# Patient Record
Sex: Female | Born: 1965 | Race: White | Hispanic: No | Marital: Single | Smoking: Former smoker
Health system: Southern US, Community
[De-identification: ages and names within clinical notes are randomized; demographics above are authoritative.]

## PROBLEM LIST (undated history)

## (undated) DIAGNOSIS — D649 Anemia, unspecified: Secondary | ICD-10-CM

## (undated) DIAGNOSIS — F17210 Nicotine dependence, cigarettes, uncomplicated: Secondary | ICD-10-CM

## (undated) DIAGNOSIS — I219 Acute myocardial infarction, unspecified: Secondary | ICD-10-CM

## (undated) DIAGNOSIS — I639 Cerebral infarction, unspecified: Secondary | ICD-10-CM

## (undated) DIAGNOSIS — F419 Anxiety disorder, unspecified: Secondary | ICD-10-CM

## (undated) DIAGNOSIS — J449 Chronic obstructive pulmonary disease, unspecified: Secondary | ICD-10-CM

## (undated) DIAGNOSIS — I10 Essential (primary) hypertension: Secondary | ICD-10-CM

## (undated) DIAGNOSIS — K219 Gastro-esophageal reflux disease without esophagitis: Secondary | ICD-10-CM

## (undated) DIAGNOSIS — E119 Type 2 diabetes mellitus without complications: Secondary | ICD-10-CM

## (undated) DIAGNOSIS — J45909 Unspecified asthma, uncomplicated: Secondary | ICD-10-CM

## (undated) DIAGNOSIS — E669 Obesity, unspecified: Secondary | ICD-10-CM

## (undated) DIAGNOSIS — Z8673 Personal history of transient ischemic attack (TIA), and cerebral infarction without residual deficits: Secondary | ICD-10-CM

## (undated) HISTORY — PX: ABOVE KNEE LEG AMPUTATION: SUR20

## (undated) HISTORY — PX: ANGIOPLASTY: SHX39

---

## 2001-03-11 ENCOUNTER — Inpatient Hospital Stay (HOSPITAL_COMMUNITY): Admission: EM | Admit: 2001-03-11 | Discharge: 2001-03-13 | Payer: Self-pay | Admitting: Emergency Medicine

## 2001-03-12 ENCOUNTER — Encounter: Payer: Self-pay | Admitting: Cardiovascular Disease

## 2001-03-17 ENCOUNTER — Ambulatory Visit (HOSPITAL_COMMUNITY): Admission: RE | Admit: 2001-03-17 | Discharge: 2001-03-18 | Payer: Self-pay | Admitting: Cardiovascular Disease

## 2001-03-24 ENCOUNTER — Inpatient Hospital Stay (HOSPITAL_COMMUNITY): Admission: RE | Admit: 2001-03-24 | Discharge: 2001-03-30 | Payer: Self-pay

## 2001-03-24 ENCOUNTER — Encounter (INDEPENDENT_AMBULATORY_CARE_PROVIDER_SITE_OTHER): Payer: Self-pay | Admitting: Specialist

## 2001-04-05 ENCOUNTER — Encounter: Payer: Self-pay | Admitting: Emergency Medicine

## 2001-04-05 ENCOUNTER — Emergency Department (HOSPITAL_COMMUNITY): Admission: EM | Admit: 2001-04-05 | Discharge: 2001-04-05 | Payer: Self-pay | Admitting: Emergency Medicine

## 2001-07-21 ENCOUNTER — Ambulatory Visit (HOSPITAL_COMMUNITY): Admission: RE | Admit: 2001-07-21 | Discharge: 2001-07-21 | Payer: Self-pay

## 2004-10-10 ENCOUNTER — Emergency Department (HOSPITAL_COMMUNITY): Admission: EM | Admit: 2004-10-10 | Discharge: 2004-10-10 | Payer: Self-pay

## 2005-09-20 ENCOUNTER — Ambulatory Visit: Payer: Self-pay | Admitting: Oncology

## 2009-02-24 ENCOUNTER — Encounter (HOSPITAL_BASED_OUTPATIENT_CLINIC_OR_DEPARTMENT_OTHER): Admission: RE | Admit: 2009-02-24 | Discharge: 2009-03-10 | Payer: Self-pay | Admitting: Internal Medicine

## 2011-05-14 NOTE — Consult Note (Signed)
[name], [name]               ACCOUNT NO.:  [id number]   MEDICAL RECORD NO.:  [id number]          PATIENT TYPE:  REC   LOCATION:  FOOT                         FACILITY:  [facility]   PHYSICIAN:  Ermalene Searing. Philip Aspen, M.D.DATE OF BIRTH:  1966-07-23   DATE OF CONSULTATION:  DATE OF DISCHARGE:                                 CONSULTATION   PRIMARY CARE PHYSICIAN:  Dr. Despina Pole.   CHIEF COMPLAINT:  Chronic left leg and foot ulcers associated with left  lower extremity arterial insufficiency.   HISTORY OF PRESENT ILLNESS:  The patient is a 45 year old Caucasian  woman with multiple medical problems.  She has longstanding diabetes  mellitus, type 2 for which she uses pills and insulin that is  complicated by peripheral neuropathy and atherosclerotic peripheral  vascular disease.  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  In early December 2009,  she was admitted to [facility] for a left femoral to peroneal artery  bypass procedure.  However, within 24 hours, the graft was occluded.  She underwent a left thrombectomy next day, however, intraoperatively it  was noted that the patient's distal anastomoses were severely stenosed  and calcified, and not amendable to operative management.  She was  discharged and then readmitted on December 26, 2008, for left leg wound  infection.  At that point, she underwent incision and drainage of the  left medial calf area.  At that point, the calf wound size measured  approximately 16 cm x 3 cm x 2.5 cm.  There was exposed subcutaneous fat  as well as muscle with no visible or palpable bone.  There was necrotic  tissue along the anterior edge of the wound.  The wound was debrided and  she was started on wound VAC treatment.  Unfortunately, this did not  improve the appearance of her wound or the ischemic pain that she has.  She presented to our clinic to see if she was a candidate for hyperbaric  oxygen treatment.   PAST MEDICAL HISTORY:  Otherwise significant for:  1. Flexion contractures of the left upper extremity and left lower      extremity.  2. Osteoarthritis of the left knee.  3. History of remote tonsillectomy.  4. Aortic clot removal in 2000.  5. Myocardial infarction x2 in 1997 with PTCA and stent procedures.   MEDICATIONS:  Medications prior to office visit include the following:  1. Glipizide 10 mg twice daily.  2. Digoxin 125 mcg daily.  3. Aspirin 81 mg daily.  4. Plavix 75 mg daily.  5. Zocor 80 mg daily.  6. Lyrica 75 mg 3 times daily.  7. Fosamax 70 mg once weekly.  8. Xopenex HFA 2 puffs 4 times daily.  9. Xanax 0.5 mg twice daily.  10.Nitroglycerin 0.4 mg sublingual p.r.n. chest pain.  11.Nexium 40 mg daily.  12.Imdur 60 mg daily.  13.Glucotrol 10 mg twice  daily.  14.Augmentin 875 mg twice daily.  15.OxyContin 40 mg every a.m. and 20 mg every p.m.  16.Claritin 10 mg daily p.r.n.  17.Albuterol nebulizer treatments 4 times daily p.r.n. wheezing.   ALLERGIES AND MEDICATION INTOLERANCES:  1. FLEXERIL has been associated with seizures.  2. PAXIL has been associated with heart palpitations.  3. ACE INHIBITORS have been somewhat problematic.   SOCIAL HISTORY:  She is divorced.  She has a 1 year old daughter, who  is in high school and a 22 year old daughter, who is in college.  She  has ongoing tobacco use of one pack per day and has been unable to quit  smoking.  She has no history of alcohol abuse.  She is disabled and does  work part-time at [employer], [employer].   FAMILY HISTORY:  Notable for close relatives with diabetes mellitus and  breast cancer, but none with colon cancer.   REVIEW OF SYSTEMS:  She has chronic dry cough without shortness of  breath on room air.  She has not had recent fever,  chills, chest pain,  or palpitations.  She has not had nausea, vomiting, significant  constipation, or diarrhea.  She has burning left leg pain much of the  time and some numbness in her feet.  She is weak in her left side and  uses a quad cane.  She has chronic mild anxiety and denies depression.   INITIAL PHYSICAL EXAMINATION:  VITAL SIGNS:  Blood pressure 121/72,  pulse 118, temperature 98.3, and blood glucose 134.  GENERAL:  She is a chronically ill-appearing white female, who had an  occasional dry cough, but no shortness of breath.  HEAD, EYES, EARS, NOSE, AND THROAT:  Within normal limits.  NECK:  Supple without jugular venous distention or carotid bruit.  CHEST:  Clear to auscultation.  HEART:  Regular rate and rhythm without significant murmur or gallop.  ABDOMEN:  Normal bowel sounds and no hepatosplenomegaly or tenderness.  EXTREMITIES:  The right leg had no cyanosis or edema and the pedal  pulses were 1+ in the anterior and posterior tibial arteries.  The left  foot had decreased light touch sensation.  Wound #1, on the medial and  proximal aspect of the left leg, there is a ulcer that measures 12.8 cm  x 6.2 cm x 0.7 cm.  This wound has partial coverage with eschar and  moderate necrotic tissue with a red-to-yellow wound base and red  granulation tissue.  There was exposed deep tendon in the mid aspect of  the wound.  There was mild foul odor and a large amount of yellow-to-  green drainage with an intact periwound integrity.  Wound #2 is located  on the plantar aspect of the left great toe and measures 0.4 cm x 0.5 cm  x undetermined depth.  This wound was fully covered with eschar and had  no significant necrotic debris.  There was no exposed bone, tendon, or  muscle, and no foul odor or discharge.  There was mild erythema around  the wound.  Wound #3 is located on the anterior aspect of the left  second toe and measures 0.3 cm x 0.4 cm x undetermined depth.  This  wound  was fully covered with eschar and did not have significant  drainage and had mild erythema around the wound.  Wound #4 is located on  the distal and anterior left leg, measures 1.6 cm x 1.7 cm x  undetermined depth.  The wound was fully covered with  eschar and had no  significant drainage and an intact periwound integrity. The left foot is  somewhat cool with some cyanosis of the second toe and toe tip capillary  refill occurs about 2 seconds.  The pedal pulses were not palpable.   IMPRESSION:  Multiple left leg and foot ulcers:  She has persistent  significant ulcers in the proximal left leg and foot due to severely  impaired arterial blood supply in the phase of diabetic neuropathy.  It  is unclear as to whether or not hyperbaric oxygen treatment would be  useful for her.  She clearly has no other options having recently had  surgery followed by a thrombectomy and poor distal targets.   PLAN:  After all the wounds were cleaned and a #10 scalpel blade was  used by Dr. Bubba Camp to debride the large left leg wound.  This was done  with minimal bleeding that stopped with application of pressure and  bandages.  Following this, the wound was packed with moist gauze and  then covered with dry gauze and a Kerlix wrap.  The small ulcers on the  distal left leg and foot were treated with Santyl and covered by gauze.  These dressings should be changed 3 times weekly by home health nursing.  In addition, she subsequently had a transcutaneous oxygen tension  measurements on the proximal left leg and dorsum of the left foot.  The  baseline TCPO2 measured out at 3 mmHg on the proximal left leg and at 9  mmHg on the left foot.  After challenge with 100% oxygen, there was no  significant change in the tissue oxygen tension levels.  This is  indicative of very poor arterial blood flow and low likelihood of  significant improvement with hyperbaric oxygen treatment.  Occasionally,  the patients will show  significant improvement in tissue oxygenation  with as little as 5 treatments of hyperbaric oxygen.  I will discuss the  findings with the patient and see if she would like to proceed with a  brief trial of hyperbaric oxygen.  Again, it was stressed to her that  reduction or complete cessation of smoking would also help her minimal  arterial blood flow.           ______________________________  Ermalene Searing Philip Aspen, M.D.     DGP/MEDQ  D:  02/28/2009  T:  03/01/2009  Job:  [id number]   cc:   Dr. Ferol Luz, MD

## 2011-05-17 NOTE — Cardiovascular Report (Signed)
[place]. [facility]  Patient:    KANISHKA, SPARKS                        MRN: [id number] Proc. Date: 03/17/01 Adm. Date:  [id number] Attending:  Berry, Jonathan Martinique CC:         Peripheral Vascular Angiographic Suite  Gilford Rile, M.D., [place], [place] [address], [place], [place] [address]   Cardiac Catheterization  INDICATIONS:  Ms. Rogue Bussing is an unfortunate 45 year old, divorced white female, mother of two, who has CAD and PVOD.  She has had an MI in 1997 and subsequent stroke.  She has non-insulin requiring diabetes, tobacco abuse, and hyperlipidemia as well as a family history of heart disease.  She is referred by Dr. Bea Graff for claudication.  She has diminished ABIs and underwent angiography on March 14 revealing fairly focal distal abdominal aortic stenosis.  She was placed on Lovenox five days ago and presents now for abdominal aortography, PTA and stenting of her distal abdominal aorta.  DESCRIPTION OF PROCEDURE:  The patient was brought to the sixth floor [place] Peripheral Vascular Angiographic Suite in the postabsorptive state.  She was premedicated with p.o. Valium and IV Versed.  Both groins were then prepped and shaved in the usual sterile fashion.  Xylocaine 1% was used for local anesthesia.  An 8 [place], 30 cm, coronary sheath was inserted into the right femoral artery using standard Seldinger technique, and a 7 French sheath was inserted into left femoral artery under direct angiographic guidance.  A total of 3000 units of heparin were administered intravenously.  Richard Rollene Fare assisted during her case.  Hand injection through the side-arm sheath revealed total occlusion of the distal abdominal aorta. Attempts at locating the previously found lumen were unsuccessful using multiple guidewires including a 0.35 Wholey, 0.35 angled Glidewire, 0.18 and 0.10 Cross-It wire.  The procedure was aborted  because of fear of going subintimal and dissecting the aorta.  OVERALL IMPRESSION:  Unsuccessful distal abdominal aortic endovascular reconstruction, secondary to interim occlusion of the distal abdominal aorta. Unfortunately, the patient will require either distal abdominal aortic endarterectomy versus aortobifemoral bypass grafting.  An ACT was measured and both sheaths were removed.  Pressure was held on each groin to achieve hemostasis.  The patient left the lab in stable condition. Lovenox will be started in the morning after which the patient will be discharged on b.i.d. subcutaneous Lovenox and ultimate outpatient Coumadin anticoagulation to a target INR of 2.5.  The patient left the lab in stable condition. DD:  03/17/01 TD:  03/18/01 Job: [id number]

## 2011-05-17 NOTE — Cardiovascular Report (Signed)
[place]. [facility]  Patient:    [name], [name]                        MRN: [id number] Proc. Date: 03/12/01 Adm. Date:  [id number] Attending:  Berry, Jonathan Martinique CC:         Peripheral Vascular Angiography Suite  Gilford Rile, M.D.  [facility]   Cardiac Catheterization  PROCEDURE:  Peripheral angiogram.  CARDIOLOGIST:  Lorretta Harp, M.D.  INDICATIONS:  Ms. [name] is an unfortunate 45 year old divorced white female patient of Dr. Bea Graff referred for PVOD.  Her risk factors profile is positive for tobacco abuse, non-insulin-dependent diabetes mellitus, hyperlipidemia and a strong family history of heart disease.  She had an myocardial infarction in 1997 and underwent angioplasty at that time.  Recent studies showed LAD with scarred with an ejection fraction of 45%.  She had a stroke in 1999 leaving her with a left hemiparesis.  She is complaining of bilateral lower extremity claudication with ability to walk only 15-20 feet without stopping.  She had diminished ABIs with dampened and monophasic femoral wave forms.  She presents now for angiography and potential intervention.  DESCRIPTION OF PROCEDURE:  The patient was brought to the sixth floor [place] Angiographic Suite in the postabsorptive state.  She was premedicated with p.o. Valium.  Her right groin was prepped and draped in the usual sterile fashion.  Then 1% Xylocaine was used for local anesthesia.  Using a smart Doppler type needle, _____ was obtained, and 5 French sheath was inserted using standard Seldinger technique.  A Judkins catheter was used to traverse the distal abdominal aorta.  The 5 French tennis-racket catheter, Ram and end-hole catheters were used for midsternal and distal abdominal aortography as well as bifemoral runoff.  Radiopaque dye was used for the entirety of the case.  A transstenotic gradient was noted here.  The area was  checked, and the sheaths were removed.  Pressure was held to the groin to achieve hemostasis.  The patient will be reheparinized and coumadinized and discharged home on an INR greater than or equal to 2.  She left the lab in stable condition.  ANGIOGRAPHIC RESULTS: 1. Abdominal aorta:    a. Renal arteries.    b. Infrarenal abdominal aorta 90% eccentric, distal abdominal       stenosis with a 5 mm gradient. 2. Left lower extremity:  Normal with three vessel run off. 3. Right lower extremity:  Normal with three vessel run off.  IMPRESSION:  Ms. [name] has a 90% eccentric distal abdominal aortic stenosis which is hemodynamically significant and amenable to PTA and stenting.  PLAN:  She will be brought back in several weeks for percutaneous intervention. DD:  03/12/01 TD:  03/12/01 Job: [id number]

## 2011-05-17 NOTE — Discharge Summary (Signed)
[place]. [facility]  Patient:    [name], [name]                        MRN: [id number] Adm. Date:  [id number] Disc. Date: 03/13/01 Attending:  Berry,Jonathan Martinique Dictator:   York Grice, P.A.                          Discharge Summary  ADMITTING DIAGNOSES: 1. Lower extremity claudication, abnormal lower extremity Dopplers.  Admit for    peripheral angiogram. 2. Non-insulin dependent diabetes mellitus. 3. Hyperlipidemia. 4. History of myocardial infarction in 1997. 5. Tobacco abuse. 6. History of cerebrovascular accident in 1998, with hemiparesis on the left    side. 7. History of ______ ______ .  Ms. Callaham is a 45 year old divorced white female, referred to our office by Dr. ______ for evaluation of peripheral vascular obstructive disease.  She had a myocardial infarction in 1997 and at that time, underwent a procedure which sounds like angioplasty, but do not have any records, and we are not sure what the procedure was exactly.  Her recent Cardiolite stress test in March 2002, showed scarring in the LAD territory with ejection fraction of 45% and no ischemia.  She also has a history of stroke in 1998, leaving her with hemiparesis on the left side.  She presented with bilateral lower extremity claudication for a long time, which was increasing gradually and dramatically worsened over the last several weeks.  She is unable to walk more than 15-20 feet without the pain.  Lower extremities arterial study showed significant bilateral commoniliac stenosis greater than 50%, and patient was admitted to the hospital for the procedure on March 12, 2001.  OUTPATIENT MEDICATIONS: 1.Darvocet-N 100 p.r.n. 2. Coumadin 5 mg alternate with 6 mg q.d. depending on the results of PT and    INR. 3. Lipitor 10 mg q.d. 4. Lanoxin 0.125 mg q.d. 5. Glucotrol 5 mg q.d. 6. Nexium 40 mg q.d. 7.Miralax.  HOSPITAL COURSE:  She was admitted to the hospital on the  14th of March and at the time of admission, her temperature 99.7, blood pressure 128/75, pulse 111, and respirations 20.  Protime was 13.2 and INR 1.0, PTT 27.  EKG showed normal sinus rhythm with low voltage.  She underwent peripheral vascular angiogram via the right femoral artery, performed by Dr. Gwenlyn Found March 12, 2001.  At that time, no renal artery disease wasfound, but the distal abdominal artery was occluded by 90% with pressureof 50 mmHg.  The patient tolerated the procedure well, and she was givena bolus of 2500 units of heparin IV and then IV drip. The next morning,the patient was found in stable condition with no bleeding or oozing from the catheterization site in the groin and mild bruise.  HOSPITAL CONSULTS:  None.  HOSPITAL LABORATORY STUDIES:  CBC - White blood cells 14.7, hemoglobin 12.5, hematocrit 35.2, platelets 236.  BMP from March 14,2002, showed sodium 135, potassium 3.7, chloride 109, carbon dioxide 19,glucose 135, BUN 12, creatinine .6.  PTT 74 at 3:00 in the afternoon andthen 19 at 7 a.m.  PHYSICAL EXAMINATION ON DISCHARGE:  GENERAL:  Alert and oriented x 3, in no acute distress.  NECK:  Supple, no thyromegaly, no lymphadenopathy, no carotid bruits or JVD.  LUNGS:  Clear to auscultation bilaterally.  HEART:  Irregular rate and rhythm.  No murmur, gallop, or rub.  ABDOMEN:  Soft, nontender, nondistended, active bowel sounds,  and no hepatosplenomegaly, organomegaly, or audible bruits.  EXTREMITIES:  Femoral arteries are barely palpable, and pedalpulses diminished with cold feet.  DISCHARGE DIAGNOSES: 1.Peripheral vascular obstructive disease, status post peripheral angiography    with 90% lower abdominal aorta stenosis.  We plan to bring the patient back    to the hospital on Tuesday next week and proceed with percutaneous    transluminal angioplasty and stent. 2. Coronary artery disease, status post myocardial infarction in 1997.  Left    Cardiolite in  February 2002, which showed scarring in the LAD territory and    ejection fraction 35%. 3. Non-insulin dependent diabetes mellitus, on Glucotrol. 4. Statuspost cerebrovascular accident in 1998. 5. Hyperlipidemia. 6. Long history of tobacco use. 7. Family history of coronary vascular accident.  DISCHARGE MEDICATIONS: 1. Injections of Lovenox 70 mg twice a day for3 days, Saturday, Sunday,    Monday. 2. Lipitor 10 mg 1 p.o. daily. 3. Lanoxin 0.125 mg 1 p.o. daily. 4. Nexium 40 mg q p.o. daily. 5. Glucotrol XL 5 mg 1 pill daily.  The patient will be back in the hospital on Tuesday for PTA and stent performed by Dr. Gwenlyn Found.DD:  03/13/01 TD:  03/13/01 Job: [id number]

## 2011-05-17 NOTE — Op Note (Signed)
[place]. [facility]  Patient:    [name], [name]                        MRN: [id number] Proc. Date: 03/24/01 Adm. Date:  [id number] Attending:  Tiffany Kocher CC:         Lorretta Harp, M.D.                           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  POSTOPERATIVE DIAGNOSIS:  Aortic occlusion due to peripheral vascular disease.  PROCEDURE:  Aortoiliac thromboendarterectomy with patch angioplasty.  SURGEON:  Ward Givens, M.D.  ASSISTANT:  Coralie Keens, M.D.  ANESTHESIA:  General.  DESCRIPTION OF PROCEDURE:  After the patient was monitored with an arterial monitor and central venous line and had a Foley catheter, induction of anesthesia and routine preparation and draping of the abdomen.  I made a midline incision from 3-4 cm below the xiphoid to about halfway between the umbilicus and pubis.  I entered the abdomen and explored it, finding some adhesions of omentum to the anterior abdominal wall and the pelvis, and I took those down.  The colon, small bowel, liver, stomach, gallbladder, and other viscera appeared to be normal.  I then got good exposure of the retroperitoneum and retracted the colon upward, small bowel to the right, sigmoid downward, and incised over the aorta, dissecting down to the aorta, dissecting the duodenum to the right, taking care to avoid injury to the inferior mesenteric vein and artery.  There was a nice pulse in the aorta, and I dissected it up to just below the renal arteries and dissected behind it so that I could pass a clamp behind.  I then dissected down to the bifurcation and on down on the iliacs for some distance but stopped short of the iliac bifurcations.  I controlled the iliacs with blue vessel loops and controlled the inferior mesenteric with a red vessel loop.  There was no pulse at the distal aorta or in the proximal iliacs, but  the iliacs were nice, soft-feeling vessels with minimal calcified plaque.  I then directed that the patient receive 6000 units of heparin and after that had had adequate time to circulate, I clamped the aorta just below the renal arteries and tightened the controls on the iliacs and inferior mesenteric.  I made a longitudinal arteriotomy on the distal aorta and removed the chronic clot which was present and dissected up until I got up to the vessel above the occluded area.  There was one lumbar vessel which was bleeding back into the aorta, and I controlled that with a single figure-of-eight suture of 4-0 Prolene.  I then took my cut down to the aortic bifurcation and felt that the plaque disease went a short distance down into the left iliac.  I extended the incision a short distance down the left iliac but did not extend it down the right iliac.  I then used a Primary school teacher and dissected in the plane between the aorta and the diseased intima, freeing it up proximally, and dividing the plaque and then dissecting distally.  Rather than follow the plaque disease distally in the iliacs, I divided it at the iliac orifices bilaterally and removed the plaque.  I then used five interrupted sutures sewn through the plaque and vessel wall and tied on the outside of  the aorta, tacking down the plaque posteriorly and posterolaterally to prevent dissection.  I then copiously irrigated the area. There was nice backbleeding from both the iliac arteries, and I bled them back three times during the case, then filled the distal vessel with heparinized saline.  The distal iliacs calibrated with a 5 mm dilator very easily.  I then irrigated out the aorta again, removed a little bit of loose thrombus material proximally, flushed the vessel forward and removed the blood and felt that my operation was satisfactory to restore good flow.  I then took a Hemashield preclotted patch and trimmed it to an elliptical  shape to nicely enlarge the distal aorta and sewed it in beginning proximally and sewing down each side distally using 5-0 Prolene suture, taking nice bites of the aorta.  When a few sutures remained to be put in, I flushed all the vessels again and flushed out the aorta with heparinized saline solution and then took the control off the right iliac and completed the suture line.  One flute of blood needed to be sutured down with a single stitch, and then hemostasis was excellent. Hemostasis was excellent otherwise.  There were good pulses in the distal iliac arteries bilaterally.  I could not feel the femoral pulses because the patient was quite obese.  Used the Doppler and interrogated the femorals, and there was excellent, brisk flow bilaterally and extremely brisk, good flow in her iliacs.  I closed the retroperitoneum with running 2-0 Vicryl stitch, taking care to avoid injury to the duodenum and to exclude it nicely from the area of the patch graft.  I then irrigated the retroperitoneum a little more and removed the irrigant.  The sponge, needle, and instrument counts were correct.  I closed the fascia with running #1 PDS and closed the skin with staples.  The patient tolerated the operation well. DD:  03/24/01 TD:  03/25/01 Job: [id number]

## 2011-05-17 NOTE — Discharge Summary (Signed)
[place]  Patient:    [name], [name]                        MRN: [id number] Adm. Date:  [id number] Disc. Date: [id number] Attending:  Tyrone Apple CC:         Lorretta Harp, M.D.   Discharge Summary  HISTORY OF PRESENT ILLNESS:  The patient is a 45 year old white female with accelerated peripheral vascular disease, having had a stroke and an MI in the past.  She had severe buttock, thigh, and leg claudication and underwent arteriography by Dr. Gwenlyn Found, showing distal aortic stenosis and subsequent occlusion when she returned for an attempt at angioplasty.  Angioplasty was technically not feasible.  The patient was admitted to the hospital for endarterectomy or bypass to improve her lower extremity perfusion.  The patient is a smoker.  She has some pulmonary problems and needs albuterol inhaler.  She is diabetic.  She had been anticoagulated with Coumadin, which had been stopped in adequate time for it to get out of her system.  See the history and physical for further details.  PHYSICAL EXAMINATION:  Remarkable for absence of pulses in the lower extremities.  There was some left hemiparesis.  There was no evidence of severe ischemia of the lower extremities.  HOSPITAL COURSE:  On the day of admission the patient underwent an aortoiliac thromboendarterectomy with patch angioplasty.  She had arterial and Swan-Ganz monitors during and after the operation.  She was maintained in intensive care after the surgery.  After surgery her femoral and distal pulses were excellent at all times, and she developed no evidence of any wound infection.  Pain management was a pretty significant problem, but the patient did well with narcotics.  She was started on Lovenox the day after surgery and eventually put back on her Coumadin.  She was started on a clear liquid diet on March 26, 2001, and progressed as tolerated.  No GI problems occurred.  The patient  was seen in consultation by Dr. Gwenlyn Found and his associates, and she developed no cardiac complications and no further neurological problems during her hospitalization.  The patient did develop evidence of pneumonia, which was treated with antibiotics with improvement.  On March 30, 2001, she was felt to be stable for discharge and was sent home with staples in place, short-term follow-up arranged.  She is to go back on her normal Coumadin dosage.  DIAGNOSES: 1. 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 2. History of stroke. 3. History of myocardial infarction. 4. Diabetes mellitus.  OPERATIONS:  Aortoiliac endarterectomy with patch angioplasty.  CONDITION ON DISCHARGE:  Improved. DD:  04/21/01 TD:  04/21/01 Job: [id number]

## 2011-05-17 NOTE — H&P (Signed)
[place]  Patient:    [name], [name]                        MRN: [id number] Adm. Date:  [id number] Attending:  Tiffany Kocher                         History and Physical  CHIEF COMPLAINT:  Occluded aorta.  PRESENT ILLNESS:  Patient is a 45 year old white female who recently was seen by Dr. Lorretta Harp because of severe claudication and diminished distal pulses.  She underwent aortography which showed severe distal aortic stenosis and then subsequent aortic occlusion which could not be opened by angiographic endovascular means.  Patient was sent to me for consideration of revascularization of the lower extremities and is admitted to the hospital for that.  She accepts the risks of surgery including death, long hospitalization, vascular damage to the lower extremities, infection and bleeding.  She has been having some pain in the legs and feet when she walks and at night, she has numbness and tingling of the feet but no definite pain in the feet.  She is a cigarette smoker and continues to smoke.  She is a type 2 diabetic. There is a history of hyperlipidemia.  There is a strong family history of vascular and cardiac disease.  Right now, she is not having any chest pain or shortness of breath.  She denies pain in her feet.  PAST MEDICAL HISTORY:  The patient had an MI in 75.  She also has had a stroke from which she recovered well.  MEDICATIONS:  She is on a number of medications including:  1. Miralax for chronic constipation.  2. Albuterol inhaler.  3. Glucotrol XL 5 mg daily.  4. Coumadin 5 mg alternating with 6 mg daily.  5. Lorazepam 1 mg as needed.  6. Lipitor 10 mg daily.  7. Nexium 40 mg daily.  8. Lanoxin 0.125 mg daily.  9. Pain medications as needed. 10. Recently on Lovenox for continuation of her anticoagulation. 11. She also has taken birth control pills.  ALLERGIES:  She is allergic to [facility].  PAST  SURGICAL HISTORY:  She has had a C-section.  FAMILY HISTORY:  Unremarkable except for vascular disease.  CHILDHOOD ILLNESSES:  Unremarkable.  REVIEW OF SYSTEMS:  Unremarkable except for above.  PHYSICAL EXAMINATION:  GENERAL:  Patient is moderately overweight.  General exam otherwise unremarkable and mental status unremarkable.  VITAL SIGNS:  Unremarkable as recorded by nursing staff.  HEENT/NECK:  The head, neck, eyes, ears, nose, mouth and throat are unremarkable.  CHEST:  Clear to auscultation.  BREASTS:  Without masses.  HEART:  Heart rate and rhythm normal.  No murmur or gallop.  ABDOMEN:  Obese.  No mass, tenderness or organomegaly.  Pfannenstiel incision. No hernia.  EXTREMITIES:  Lower extremities:  No pulses.  Adequate capillary filling bilaterally.  Good sensation bilaterally.  There were no carotid bruits.  No femoral pulses.  SKIN:  No skin lesions noted.  LYMPH NODES:  None enlarged.  NEUROLOGIC:  Grossly intact.  IMPRESSION:  1. Aortic occlusion.  2. Coronary artery disease, stable.  3. Diffuse peripheral vascular disease with history of stroke.  4. Diabetes, type 2.  PLAN:  Aortoiliac endarterectomy or aortofemoral bypass. DD:  03/24/01 TD:  03/25/01 Job: [id number]

## 2014-02-20 ENCOUNTER — Encounter (HOSPITAL_COMMUNITY): Payer: Self-pay | Admitting: Emergency Medicine

## 2014-02-20 ENCOUNTER — Inpatient Hospital Stay (HOSPITAL_COMMUNITY): Payer: Medicare Other

## 2014-02-20 ENCOUNTER — Emergency Department (HOSPITAL_COMMUNITY): Payer: Medicare Other

## 2014-02-20 ENCOUNTER — Inpatient Hospital Stay (HOSPITAL_COMMUNITY)
Admission: EM | Admit: 2014-02-20 | Discharge: 2014-03-02 | DRG: 065 | Disposition: A | Discharge status: Skilled Nursing Facility | Payer: Medicare Other | Attending: Neurology | Admitting: Neurology

## 2014-02-20 DIAGNOSIS — I6350 Cerebral infarction due to unspecified occlusion or stenosis of unspecified cerebral artery: Principal | ICD-10-CM | POA: Diagnosis present

## 2014-02-20 DIAGNOSIS — F411 Generalized anxiety disorder: Secondary | ICD-10-CM | POA: Diagnosis present

## 2014-02-20 DIAGNOSIS — I639 Cerebral infarction, unspecified: Secondary | ICD-10-CM

## 2014-02-20 DIAGNOSIS — I2510 Atherosclerotic heart disease of native coronary artery without angina pectoris: Secondary | ICD-10-CM | POA: Diagnosis present

## 2014-02-20 DIAGNOSIS — F17200 Nicotine dependence, unspecified, uncomplicated: Secondary | ICD-10-CM | POA: Diagnosis present

## 2014-02-20 DIAGNOSIS — E1165 Type 2 diabetes mellitus with hyperglycemia: Secondary | ICD-10-CM | POA: Diagnosis present

## 2014-02-20 DIAGNOSIS — Z7902 Long term (current) use of antithrombotics/antiplatelets: Secondary | ICD-10-CM

## 2014-02-20 DIAGNOSIS — G8190 Hemiplegia, unspecified affecting unspecified side: Secondary | ICD-10-CM | POA: Diagnosis present

## 2014-02-20 DIAGNOSIS — R4701 Aphasia: Secondary | ICD-10-CM | POA: Diagnosis present

## 2014-02-20 DIAGNOSIS — R29810 Facial weakness: Secondary | ICD-10-CM | POA: Diagnosis present

## 2014-02-20 DIAGNOSIS — J449 Chronic obstructive pulmonary disease, unspecified: Secondary | ICD-10-CM | POA: Diagnosis present

## 2014-02-20 DIAGNOSIS — Z8673 Personal history of transient ischemic attack (TIA), and cerebral infarction without residual deficits: Secondary | ICD-10-CM

## 2014-02-20 DIAGNOSIS — J4489 Other specified chronic obstructive pulmonary disease: Secondary | ICD-10-CM | POA: Diagnosis present

## 2014-02-20 DIAGNOSIS — Z7982 Long term (current) use of aspirin: Secondary | ICD-10-CM

## 2014-02-20 DIAGNOSIS — I10 Essential (primary) hypertension: Secondary | ICD-10-CM | POA: Diagnosis present

## 2014-02-20 DIAGNOSIS — E785 Hyperlipidemia, unspecified: Secondary | ICD-10-CM | POA: Diagnosis present

## 2014-02-20 DIAGNOSIS — D72829 Elevated white blood cell count, unspecified: Secondary | ICD-10-CM | POA: Diagnosis present

## 2014-02-20 DIAGNOSIS — K219 Gastro-esophageal reflux disease without esophagitis: Secondary | ICD-10-CM | POA: Diagnosis present

## 2014-02-20 DIAGNOSIS — Z9861 Coronary angioplasty status: Secondary | ICD-10-CM

## 2014-02-20 DIAGNOSIS — I252 Old myocardial infarction: Secondary | ICD-10-CM

## 2014-02-20 DIAGNOSIS — F17210 Nicotine dependence, cigarettes, uncomplicated: Secondary | ICD-10-CM | POA: Diagnosis present

## 2014-02-20 DIAGNOSIS — Z6834 Body mass index (BMI) 34.0-34.9, adult: Secondary | ICD-10-CM

## 2014-02-20 DIAGNOSIS — I959 Hypotension, unspecified: Secondary | ICD-10-CM | POA: Diagnosis present

## 2014-02-20 DIAGNOSIS — E669 Obesity, unspecified: Secondary | ICD-10-CM | POA: Diagnosis present

## 2014-02-20 DIAGNOSIS — IMO0002 Reserved for concepts with insufficient information to code with codable children: Secondary | ICD-10-CM | POA: Diagnosis present

## 2014-02-20 DIAGNOSIS — Z79899 Other long term (current) drug therapy: Secondary | ICD-10-CM

## 2014-02-20 DIAGNOSIS — E876 Hypokalemia: Secondary | ICD-10-CM | POA: Diagnosis not present

## 2014-02-20 DIAGNOSIS — S78119A Complete traumatic amputation at level between unspecified hip and knee, initial encounter: Secondary | ICD-10-CM

## 2014-02-20 DIAGNOSIS — Z794 Long term (current) use of insulin: Secondary | ICD-10-CM

## 2014-02-20 DIAGNOSIS — E1169 Type 2 diabetes mellitus with other specified complication: Secondary | ICD-10-CM | POA: Diagnosis present

## 2014-02-20 DIAGNOSIS — E119 Type 2 diabetes mellitus without complications: Secondary | ICD-10-CM | POA: Diagnosis present

## 2014-02-20 HISTORY — DX: Type 2 diabetes mellitus without complications: E11.9

## 2014-02-20 HISTORY — DX: Gastro-esophageal reflux disease without esophagitis: K21.9

## 2014-02-20 HISTORY — DX: Unspecified asthma, uncomplicated: J45.909

## 2014-02-20 HISTORY — DX: Chronic obstructive pulmonary disease, unspecified: J44.9

## 2014-02-20 HISTORY — DX: Personal history of transient ischemic attack (TIA), and cerebral infarction without residual deficits: Z86.73

## 2014-02-20 HISTORY — DX: Nicotine dependence, cigarettes, uncomplicated: F17.210

## 2014-02-20 HISTORY — DX: Cerebral infarction, unspecified: I63.9

## 2014-02-20 HISTORY — DX: Acute myocardial infarction, unspecified: I21.9

## 2014-02-20 HISTORY — DX: Anemia, unspecified: D64.9

## 2014-02-20 HISTORY — DX: Essential (primary) hypertension: I10

## 2014-02-20 HISTORY — DX: Obesity, unspecified: E66.9

## 2014-02-20 HISTORY — DX: Anxiety disorder, unspecified: F41.9

## 2014-02-20 LAB — URINALYSIS, ROUTINE W REFLEX MICROSCOPIC
Bilirubin Urine: NEGATIVE
Glucose, UA: 500 mg/dL — AB
HGB URINE DIPSTICK: NEGATIVE
KETONES UR: 15 mg/dL — AB
Leukocytes, UA: NEGATIVE
Nitrite: NEGATIVE
PROTEIN: NEGATIVE mg/dL
Specific Gravity, Urine: 1.034 — ABNORMAL HIGH (ref 1.005–1.030)
UROBILINOGEN UA: 0.2 mg/dL (ref 0.0–1.0)
pH: 7.5 (ref 5.0–8.0)

## 2014-02-20 LAB — COMPREHENSIVE METABOLIC PANEL
ALBUMIN: 3.5 g/dL (ref 3.5–5.2)
ALT: 14 U/L (ref 0–35)
AST: 11 U/L (ref 0–37)
Alkaline Phosphatase: 122 U/L — ABNORMAL HIGH (ref 39–117)
BUN: 9 mg/dL (ref 6–23)
CALCIUM: 9.5 mg/dL (ref 8.4–10.5)
CO2: 23 mEq/L (ref 19–32)
Chloride: 100 mEq/L (ref 96–112)
Creatinine, Ser: 0.28 mg/dL — ABNORMAL LOW (ref 0.50–1.10)
GFR calc Af Amer: >90 mL/min (ref >90)
GFR calc non Af Amer: >90 mL/min (ref >90)
Glucose, Bld: 252 mg/dL — ABNORMAL HIGH (ref 70–99)
Potassium: 3.9 mEq/L (ref 3.7–5.3)
SODIUM: 139 meq/L (ref 137–147)
TOTAL PROTEIN: 7.6 g/dL (ref 6.0–8.3)
Total Bilirubin: 0.2 mg/dL — ABNORMAL LOW (ref 0.3–1.2)

## 2014-02-20 LAB — DIFFERENTIAL
BASOS PCT: 0 % (ref 0–1)
Basophils Absolute: 0 10*3/uL (ref 0.0–0.1)
EOS ABS: 0 10*3/uL (ref 0.0–0.7)
EOS PCT: 0 % (ref 0–5)
Lymphocytes Relative: 18 % (ref 12–46)
Lymphs Abs: 2.2 10*3/uL (ref 0.7–4.0)
Monocytes Absolute: 0.5 10*3/uL (ref 0.1–1.0)
Monocytes Relative: 4 % (ref 3–12)
NEUTROS PCT: 78 % — AB (ref 43–77)
Neutro Abs: 9.4 10*3/uL — ABNORMAL HIGH (ref 1.7–7.7)

## 2014-02-20 LAB — I-STAT TROPONIN, ED: TROPONIN I, POC: 0 ng/mL (ref 0.00–0.08)

## 2014-02-20 LAB — CBC
HCT: 39.2 % (ref 36.0–46.0)
Hemoglobin: 13.2 g/dL (ref 12.0–15.0)
MCH: 28.1 pg (ref 26.0–34.0)
MCHC: 33.7 g/dL (ref 30.0–36.0)
MCV: 83.6 fL (ref 78.0–100.0)
PLATELETS: 319 10*3/uL (ref 150–400)
RBC: 4.69 MIL/uL (ref 3.87–5.11)
RDW: 18.2 % — AB (ref 11.5–15.5)
WBC: 12.2 10*3/uL — ABNORMAL HIGH (ref 4.0–10.5)

## 2014-02-20 LAB — I-STAT CHEM 8, ED
BUN: 7 mg/dL (ref 6–23)
Calcium, Ion: 1.18 mmol/L (ref 1.12–1.23)
Chloride: 102 mEq/L (ref 96–112)
Creatinine, Ser: 0.3 mg/dL — ABNORMAL LOW (ref 0.50–1.10)
GLUCOSE: 261 mg/dL — AB (ref 70–99)
HCT: 42 % (ref 36.0–46.0)
Hemoglobin: 14.3 g/dL (ref 12.0–15.0)
Potassium: 3.7 mEq/L (ref 3.7–5.3)
Sodium: 140 mEq/L (ref 137–147)
TCO2: 24 mmol/L (ref 0–100)

## 2014-02-20 LAB — PROTIME-INR
INR: 0.95 (ref 0.00–1.49)
Prothrombin Time: 12.5 seconds (ref 11.6–15.2)

## 2014-02-20 LAB — GLUCOSE, CAPILLARY: GLUCOSE-CAPILLARY: 214 mg/dL — AB (ref 70–99)

## 2014-02-20 LAB — APTT: aPTT: 30 seconds (ref 24–37)

## 2014-02-20 LAB — ETHANOL: Alcohol, Ethyl (B): <11 mg/dL (ref 0–11)

## 2014-02-20 MED ORDER — SODIUM CHLORIDE 0.9 % IV SOLN
INTRAVENOUS | Status: DC
  Administered 2014-02-20: 1000 mL via INTRAVENOUS
  Administered 2014-02-22: 22:00:00 via INTRAVENOUS

## 2014-02-20 MED ORDER — FENTANYL CITRATE 0.05 MG/ML IJ SOLN
INTRAMUSCULAR | Status: AC
  Filled 2014-02-20: qty 2

## 2014-02-20 MED ORDER — MIDAZOLAM HCL 2 MG/2ML IJ SOLN
INTRAMUSCULAR | Status: AC | PRN
  Administered 2014-02-20: 1 mg via INTRAVENOUS

## 2014-02-20 MED ORDER — CARVEDILOL 3.125 MG PO TABS
3.1250 mg | ORAL_TABLET | Freq: Two times a day (BID) | ORAL | Status: DC
  Administered 2014-02-22 – 2014-03-02 (×12): 3.125 mg via ORAL
  Filled 2014-02-20 (×22): qty 1

## 2014-02-20 MED ORDER — IOHEXOL 300 MG/ML  SOLN
150.0000 mL | Freq: Once | INTRAMUSCULAR | Status: AC | PRN
  Administered 2014-02-20: 15 mL via INTRAVENOUS

## 2014-02-20 MED ORDER — DIGOXIN 125 MCG PO TABS
0.1250 mg | ORAL_TABLET | ORAL | Status: DC
  Administered 2014-02-25 – 2014-03-02 (×3): 0.125 mg via ORAL
  Filled 2014-02-20 (×5): qty 1

## 2014-02-20 MED ORDER — INSULIN ASPART 100 UNIT/ML ~~LOC~~ SOLN
2.0000 [IU] | SUBCUTANEOUS | Status: DC
  Administered 2014-02-20: 6 [IU] via SUBCUTANEOUS
  Administered 2014-02-21: 4 [IU] via SUBCUTANEOUS
  Administered 2014-02-21 – 2014-02-22 (×3): 2 [IU] via SUBCUTANEOUS

## 2014-02-20 MED ORDER — PANTOPRAZOLE SODIUM 40 MG IV SOLR
40.0000 mg | Freq: Every day | INTRAVENOUS | Status: DC
Start: 2014-02-20 — End: 2014-02-26
  Administered 2014-02-20 – 2014-02-25 (×6): 40 mg via INTRAVENOUS
  Filled 2014-02-20 (×7): qty 40

## 2014-02-20 MED ORDER — FENTANYL CITRATE 0.05 MG/ML IJ SOLN
INTRAMUSCULAR | Status: AC | PRN
  Administered 2014-02-20 (×3): 25 ug via INTRAVENOUS

## 2014-02-20 MED ORDER — DULOXETINE HCL 60 MG PO CPEP
60.0000 mg | ORAL_CAPSULE | Freq: Two times a day (BID) | ORAL | Status: DC
  Administered 2014-02-22 – 2014-03-02 (×15): 60 mg via ORAL
  Filled 2014-02-20 (×22): qty 1

## 2014-02-20 MED ORDER — RANOLAZINE ER 500 MG PO TB12
500.0000 mg | ORAL_TABLET | Freq: Two times a day (BID) | ORAL | Status: DC
  Administered 2014-02-22 – 2014-02-26 (×8): 500 mg via ORAL
  Filled 2014-02-20 (×17): qty 1

## 2014-02-20 MED ORDER — ATORVASTATIN CALCIUM 20 MG PO TABS
20.0000 mg | ORAL_TABLET | Freq: Every day | ORAL | Status: DC
  Administered 2014-02-22 – 2014-03-01 (×8): 20 mg via ORAL
  Filled 2014-02-20 (×11): qty 1

## 2014-02-20 MED ORDER — MIDAZOLAM HCL 2 MG/2ML IJ SOLN
INTRAMUSCULAR | Status: AC
  Filled 2014-02-20: qty 2

## 2014-02-20 NOTE — Procedures (Signed)
S/P pelvic arteriogram. Lt CFA approach. US guidance. Findings. 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 Procedure stopped.

## 2014-02-20 NOTE — Progress Notes (Signed)
Per family pt has been not taking any of her medications as prescribed. Pt will take medication when she feels like it and does not take the correct dose.

## 2014-02-20 NOTE — ED Notes (Signed)
RN notified Dr. Nicole Kindred informed him that patient is now able to say name and birthday correctly, is alert and keenly responsive.

## 2014-02-20 NOTE — Code Documentation (Signed)
Code stroke called at 1628, Patient arrived to [facility] ED via [facility] at [phone].  Labs drawn on arrival, Patient had CTA after arrival to [facility] Ed.  LSN at 1400, where patient suddenly developed aphasia.  NIHSS 14, has hx of prior stroke.  Interventional radiology notified of patient at 1740.  Family updated and at bedside

## 2014-02-20 NOTE — Progress Notes (Signed)
Pt arrived from IR to 3s09.Marland Kitchen Pt was found to have a Fentanyl 22mcg/ hr transdermal patch on her R upper arm. Patch removed and disposed of per protocol. Pt received versed and fentanyl in IR per report. Pt is lethargic and only is responding to physical stimulus. Pt VS are stable and document. Will continue to monitor pt.

## 2014-02-20 NOTE — H&P (Addendum)
Neurology Consultation Reason for Consult: Aphasia Referring Physician: Rancour, S  CC: Aphasia  History is obtained from:PAtient's daughter, referring physician  HPI: [name] is a 48 y.o. female with a history of previous infarcts who presents with sudden onset aphasia earlier today. She was brought to the ED in [place] where a CT was performed which showed(per report) a new stroke that was not apparent on a CT done 12 days earlier. She was therefore not a tpa candidate and was transferred to [facility] for further evaluation. Here, she was found to be aphasic and a CTA was obtained suggesting left parietal branch M2 occlusion and therefore she was felt to be a possible intervention candidate.   She has a history of multiple strokes with left sided weakness.   LKW: 2pm tpa given?: no, recent infarct.   ROS: A 14 point ROS was performed and is negative except as noted in the HPI.  Past Medical History  Diagnosis Date  . Stroke   . MI (myocardial infarction)   . Hypertension   . Asthma   . COPD (chronic obstructive pulmonary disease)   . GERD (gastroesophageal reflux disease)   . Anemia   . Diabetes mellitus without complication   . Anxiety    Outpatient medications per [facility]: Lasix 20mg  PRN nitrglycerin 0.4mg  SL PRN Phenergan 25mg  PRN ASA 81mg  PRN plavix 75 mg PRN Digoxin .125mg  qm-w-f cymbalta 60mg  BID Magnesium 400mg  qd Insulin 15 u SQ AC levemir 30 u Roselle HS ranexa 500mg  bid crestor 10mg  hs Xanax 1mg  TID PRN Coreg 3.125 mg BID  Fentanyl patch 50 mcg/hr Midodrine 5mg  TID Oxycodone 15mg  Q4H Opana ER 20mg  Q12H lyrica 50mg  TID     Family History: Unable to assess secondary to patient's altered mental status.     Social History: Tob: Unable to assess secondary to patient's altered mental status.     Exam: Current vital signs: BP 148/78  Pulse 118  Temp(Src) 98.8 F (37.1 C) (Oral)  Resp 20  SpO2 97% Vital signs in last 24 hours: Temp:   [98.8 F (37.1 C)] 98.8 F (37.1 C) (02/22 1744) Pulse Rate:  [118] 118 (02/22 1744) Resp:  [20] 20 (02/22 1744) BP: (148)/(78) 148/78 mmHg (02/22 1744) SpO2:  [97 %] 97 % (02/22 1744)  General: in bed, appears uncomfortable CV: RRR Resp: non-laboured breathing Abd: NT, ND Ext: BKA bilaterally Mental Status: Patient is awake, alert, able to answer some simple questions with nods/shakes though reliability is questionable. She is able to follow simple but not more complex commands.  She says a single word "crystal", her daughters name Cranial Nerves: II: blinks to threat from right but not left.  Pupils are equal, round, and reactive to light.  Discs are difficult to visualize. III,IV, VI: EOMI without ptosis or diploplia.  V: Facial sensation is symmetric to temperature VII: Facial movement is notable for weakness on the left arm, appears to have good strength in left leg.  VIII: hearing is intact to voice X: Uvula elevates symmetrically XI: Shoulder shrug is symmetric. XII: tongue is midline without atrophy or fasciculations.  Motor: Tone is normal on right. Bulk is normal. Spastic hemiparesis on left.  Sensory: Reports decreased sensation throughout left side.  Deep Tendon Reflexes: 2+ and symmetric in the biceps and patellae.  Cerebellar: No clear dysmetria on right.  Gait: Not assessed due to acute nature of evaluation and multiple medical monitors in ED setting.  I have reviewed labs in epic and the results pertinent  to this consultation are: cmp - unremarkable Cbc - mild leukocytosis   I have reviewed the images obtained:CTA head - possible M2 occlusion on the left.   Impression: 48 yo F with left M2 occlusion and new aphasia. After discussing the risk and benefits of the procedure, the daughter asked to make a phone call and was gone for approximately 40 minutes. She returned finally and wanted to proceed with angiography for consideration of intervention.    Recommendations: 1) Angiogram with liekly intervention 2) Will need ICU monitoring after intervention if embolectomy performed.  3) will continue home digoxin, carvedilol, ranexa cymbalta, crestor 4) Holding home midodrine, though if BP is an issue, may consider restarting.  5) holding home pain meds, though may need to be restarted if pain is an issue.  6) echo, dopplers, MRI and MRA brain 7) LDL, A1C 8) ASA after 24 hours if IA tpa is used 9) frequent neuro checks 10) SCDs for dvt prophy,     This patient is critically ill and at significant risk of neurological worsening, death and care requires constant monitoring of vital signs, hemodynamics,respiratory and cardiac monitoring, neurological assessment, discussion with family, other specialists and medical decision making of high complexity. I spent 100 minutes of neurocritical care time  in the care of  this patient.  Roland Rack, MD [facility] [phone]  If 7pm- 7am, please page neurology on call at [phone].  02/20/2014  7:55 PM

## 2014-02-20 NOTE — ED Notes (Signed)
IR team at bedside

## 2014-02-20 NOTE — ED Notes (Signed)
Aborted procedure access to the left groin was obtained. Pt has occluded aorta. Pt has multiple vascular issues.

## 2014-02-20 NOTE — ED Notes (Signed)
Neurologist at the bedside speaking with patient and family regarding treatment options.

## 2014-02-20 NOTE — ED Notes (Signed)
Pt presents to department via [facility] from [facility]. LSN: 2:00pm today, family stated she suddenly stopped speaking. Previous history of stroke with L sided deficits. Upon arrival L side rigid, L sided facial droop and expressive aphasia noted. Not TPA candidate due to recent CVA. 20g R forearm. Pt is lethargic upon arrival.

## 2014-02-20 NOTE — ED Provider Notes (Signed)
CSN: [id number]     Arrival date & time 02/20/14  1708 History   First MD Initiated Contact with Patient 02/20/14 1716     Chief Complaint  Patient presents with  . Cerebrovascular Accident     (Consider location/radiation/quality/duration/timing/severity/associated sxs/prior Treatment) HPI Comments: Patient from [facility] as code stroke. Last seen normal at 2 PM today developed expressive aphasia and right-sided deficits. History of previous stroke with left-sided deficits. Left side remains flaccid at this time. CT head performed of the hospital negative for hemorrhage. Discussed with neurology Dr. Leonel Ramsay who stated she was not TPA candidate due to her previous stroke. She is able to not her head yes and no. She denies any chest pain or shortness of breath.  The history is provided by the patient.    Past Medical History  Diagnosis Date  . Stroke   . MI (myocardial infarction)   . Hypertension   . Asthma   . COPD (chronic obstructive pulmonary disease)   . GERD (gastroesophageal reflux disease)   . Anemia   . Diabetes mellitus without complication   . Anxiety    Past Surgical History  Procedure Laterality Date  . Above knee leg amputation Bilateral   . Angioplasty     No family history on file. History  Substance Use Topics  . Smoking status: Current Every Day Smoker    Types: Cigarettes  . Smokeless tobacco: Not on file  . Alcohol Use: No   OB History   Grav Para Term Preterm Abortions TAB SAB Ect Mult Living                 Review of Systems  Unable to perform ROS: Acuity of condition  Constitutional: Negative for activity change and appetite change.  Respiratory: Negative for cough, chest tightness and shortness of breath.   Cardiovascular: Negative for chest pain.  Gastrointestinal: Negative for nausea, vomiting and abdominal pain.  Genitourinary: Negative for dysuria, hematuria, vaginal bleeding and vaginal discharge.  Musculoskeletal: Negative  for back pain.  Skin: Negative for rash.      Allergies  Ace inhibitors; Flexeril; and Paxil  Home Medications  No current outpatient prescriptions on file. BP 119/60  Pulse 93  Temp(Src) 98 F (36.7 C) (Oral)  Resp 21  Ht 4' (1.219 m)  Wt 114 lb 3.2 oz (51.8 kg)  BMI 34.86 kg/m2  SpO2 97% Physical Exam  Constitutional: She is oriented to person, place, and time. She appears well-developed and well-nourished. No distress.  Nods head yes and no.  HENT:  Head: Normocephalic and atraumatic.  Mouth/Throat: Oropharynx is clear and moist. No oropharyngeal exudate.  Expressive aphasia  Eyes: Conjunctivae and EOM are normal. Pupils are equal, round, and reactive to light.  Neck: Normal range of motion. Neck supple.  Cardiovascular: Normal rate, regular rhythm and normal heart sounds.   No murmur heard. Pulmonary/Chest: Effort normal and breath sounds normal. No respiratory distress. She has no wheezes.  Abdominal: Soft. Bowel sounds are normal. There is no tenderness. There is no rebound and no guarding.  Musculoskeletal: Normal range of motion. She exhibits no edema and no tenderness.  Bilateral AKAs  Neurological: She is alert and oriented to person, place, and time. No cranial nerve deficit. She exhibits normal muscle tone. Coordination normal.  L side flacid. Moving R side spontaneously.  Skin: Skin is warm and dry.    ED Course  Procedures (including critical care time) Labs Review Labs Reviewed  CBC - Abnormal; Notable for  the following:    WBC 12.2 (*)    RDW 18.2 (*)    All other components within normal limits  DIFFERENTIAL - Abnormal; Notable for the following:    Neutrophils Relative % 78 (*)    Neutro Abs 9.4 (*)    All other components within normal limits  COMPREHENSIVE METABOLIC PANEL - Abnormal; Notable for the following:    Glucose, Bld 252 (*)    Creatinine, Ser 0.28 (*)    Alkaline Phosphatase 122 (*)    Total Bilirubin 0.2 (*)    All other  components within normal limits  URINE RAPID DRUG SCREEN (HOSP PERFORMED) - Abnormal; Notable for the following:    Benzodiazepines POSITIVE (*)    All other components within normal limits  URINALYSIS, ROUTINE W REFLEX MICROSCOPIC - Abnormal; Notable for the following:    Specific Gravity, Urine 1.034 (*)    Glucose, UA 500 (*)    Ketones, ur 15 (*)    All other components within normal limits  GLUCOSE, CAPILLARY - Abnormal; Notable for the following:    Glucose-Capillary 214 (*)    All other components within normal limits  GLUCOSE, CAPILLARY - Abnormal; Notable for the following:    Glucose-Capillary 154 (*)    All other components within normal limits  I-STAT CHEM 8, ED - Abnormal; Notable for the following:    Creatinine, Ser 0.30 (*)    Glucose, Bld 261 (*)    All other components within normal limits  MRSA PCR SCREENING  ETHANOL  PROTIME-INR  APTT  HEMOGLOBIN A1C  LIPID PANEL  I-STAT TROPOININ, ED  I-STAT TROPOININ, ED   Imaging Review Ct Angio Head W/cm &/or Wo Cm  02/20/2014   CLINICAL DATA:  Stroke.  Aphasia.  Right-sided weakness.  EXAM: CT ANGIOGRAPHY HEAD AND NECK  TECHNIQUE: Multidetector CT imaging of the head and neck was performed using the standard protocol during bolus administration of intravenous contrast. Multiplanar CT image reconstructions and MIPs were obtained to evaluate the vascular anatomy. Carotid stenosis measurements (when applicable) are obtained utilizing NASCET criteria, using the distal internal carotid diameter as the denominator.  CONTRAST:  100 mL Omnipaque 300 IV  COMPARISON:  CT head earlier today  FINDINGS: CTA HEAD FINDINGS  Image quality is degraded by significant motion. Progressive edema in the left insula since earlier today compatible with acute left MCA infarct. Negative for hemorrhage.  Chronic right MCA infarct. There may be extension of acute infarct over the convexity on the right compared with earlier studies.  Severe intracranial  atherosclerotic disease. The patient has small calcified vessels which are difficult to evaluate. In addition, there is suboptimal arterial opacification and mild motion further degrading image quality.  Atherosclerotic calcification in the distal vertebral arteries bilaterally causing moderate stenosis. Both vertebral arteries are patent to the basilar. The basilar is patent. Superior cerebellar and posterior cerebral arteries are patent bilaterally.  Right cavernous carotid is diffusely calcified with moderate to severe stenosis. Right A1 and A2 segments are patent. Right M1 segment is occluded proximally. There are a few reconstituted right middle cerebral artery branches. There is a large right middle cerebral artery infarct.  Atherosclerotic calcification in the left cavernous carotid with moderate stenosis. Left A1 and A2 segments are patent. Left M1 segment is patent. There is clot in the M2 segment supplying the parietal lobe. There is some faint reconstitution of left middle cerebral artery branches distal to the clot. Temporal branch of the left MCA is patent.  Review  of the MIP images confirms the above findings.  CTA NECK FINDINGS  Atherosclerotic aorta. Atherosclerotic calcification in the proximal great vessels. There is suboptimal opacification of the arteries due to injection timing and possibly venous stenosis.  Right carotid: Atherosclerotic calcification of the bifurcation. There is significant stenosis of the right internal carotid artery however it is difficult to quantitate the stenosis due to calcification and lack of optimal opacification. There appears to be greater than 75% diameter stenosis of the right internal carotid artery proximally.  Left carotid: Atherosclerotic calcification in the carotid bulb with mild stenosis. Accurate quantification of stenosis is difficult due to motion and suboptimal opacification. Left internal carotid artery is patent to the skullbase.  Both vertebral  arteries are patent with suboptimal visualization of the origins. There is atherosclerotic calcification and moderate stenosis of the distal vertebral artery bilaterally.  Review of the MIP images confirms the above findings.  IMPRESSION: Acute left MCA infarct with progressive edema in the left insula. CTA suboptimal but does reveal thrombus in the left M2 segment.  Large right chronic MCA infarct with possible extension over the convexity.  Severe intracranial atherosclerotic disease with diffuse vessel calcification and atherosclerotic irregularity.  Atherosclerotic calcification in the carotid bifurcation bilaterally. There appears to be 75 % stenosis on the right and mild stenosis on the left although quantification of stenosis is not possible due to motion and suboptimal arterial enhancement.  I discussed the findings by telephone with Dr. Leonel Ramsay.   Electronically Signed   By: Franchot Gallo M.D.   On: 02/20/2014 19:10   Dg Chest 2 View  02/21/2014   CLINICAL DATA:  Cough.  EXAM: CHEST  2 VIEW  COMPARISON:  DG CHEST PORTABLE dated 02/20/2014  FINDINGS: Cardiac silhouette is upper limits of normal in size, mediastinal silhouette is nonsuspicious, mildly calcified aortic knob. No pleural effusions or focal consolidations. No pneumothorax.  Surgical clips in right anterior chest. Osseous structures are nonsuspicious.  IMPRESSION: Borderline cardiomegaly, no acute pulmonary process.   Electronically Signed   By: Elon Alas   On: 02/21/2014 00:55   Ct Angio Neck W/cm &/or Wo/cm  02/20/2014   CLINICAL DATA:  Stroke.  Aphasia.  Right-sided weakness.  EXAM: CT ANGIOGRAPHY HEAD AND NECK  TECHNIQUE: Multidetector CT imaging of the head and neck was performed using the standard protocol during bolus administration of intravenous contrast. Multiplanar CT image reconstructions and MIPs were obtained to evaluate the vascular anatomy. Carotid stenosis measurements (when applicable) are obtained utilizing  NASCET criteria, using the distal internal carotid diameter as the denominator.  CONTRAST:  100 mL Omnipaque 300 IV  COMPARISON:  CT head earlier today  FINDINGS: CTA HEAD FINDINGS  Image quality is degraded by significant motion. Progressive edema in the left insula since earlier today compatible with acute left MCA infarct. Negative for hemorrhage.  Chronic right MCA infarct. There may be extension of acute infarct over the convexity on the right compared with earlier studies.  Severe intracranial atherosclerotic disease. The patient has small calcified vessels which are difficult to evaluate. In addition, there is suboptimal arterial opacification and mild motion further degrading image quality.  Atherosclerotic calcification in the distal vertebral arteries bilaterally causing moderate stenosis. Both vertebral arteries are patent to the basilar. The basilar is patent. Superior cerebellar and posterior cerebral arteries are patent bilaterally.  Right cavernous carotid is diffusely calcified with moderate to severe stenosis. Right A1 and A2 segments are patent. Right M1 segment is occluded proximally. There are a few  reconstituted right middle cerebral artery branches. There is a large right middle cerebral artery infarct.  Atherosclerotic calcification in the left cavernous carotid with moderate stenosis. Left A1 and A2 segments are patent. Left M1 segment is patent. There is clot in the M2 segment supplying the parietal lobe. There is some faint reconstitution of left middle cerebral artery branches distal to the clot. Temporal branch of the left MCA is patent.  Review of the MIP images confirms the above findings.  CTA NECK FINDINGS  Atherosclerotic aorta. Atherosclerotic calcification in the proximal great vessels. There is suboptimal opacification of the arteries due to injection timing and possibly venous stenosis.  Right carotid: Atherosclerotic calcification of the bifurcation. There is significant  stenosis of the right internal carotid artery however it is difficult to quantitate the stenosis due to calcification and lack of optimal opacification. There appears to be greater than 75% diameter stenosis of the right internal carotid artery proximally.  Left carotid: Atherosclerotic calcification in the carotid bulb with mild stenosis. Accurate quantification of stenosis is difficult due to motion and suboptimal opacification. Left internal carotid artery is patent to the skullbase.  Both vertebral arteries are patent with suboptimal visualization of the origins. There is atherosclerotic calcification and moderate stenosis of the distal vertebral artery bilaterally.  Review of the MIP images confirms the above findings.  IMPRESSION: Acute left MCA infarct with progressive edema in the left insula. CTA suboptimal but does reveal thrombus in the left M2 segment.  Large right chronic MCA infarct with possible extension over the convexity.  Severe intracranial atherosclerotic disease with diffuse vessel calcification and atherosclerotic irregularity.  Atherosclerotic calcification in the carotid bifurcation bilaterally. There appears to be 75 % stenosis on the right and mild stenosis on the left although quantification of stenosis is not possible due to motion and suboptimal arterial enhancement.  I discussed the findings by telephone with Dr. Leonel Ramsay.   Electronically Signed   By: Franchot Gallo M.D.   On: 02/20/2014 19:10      MDM   Final diagnoses:  Stroke   aphasia with right-sided weakness acute onset. Her stroke on arrival. Not TPA candidate per neurology secondary to recent stroke  CT scan negative for hemorrhage at outside hospital. CTA shows possible M2 occlusion. Patient not TPA candidate secondary to recent stroke. Dr. Leonel Ramsay discussed with family and will proceed to IR for possible intervention.  Vitals remain stable in the ED, patient protecting airway.     Date: 02/20/2014   Rate: 110  Rhythm: sinus tachycardia  QRS Axis: normal  Intervals: normal  ST/T Wave abnormalities: normal  Conduction Disutrbances:none  Narrative Interpretation:   Old EKG Reviewed: none available   CRITICAL CARE Performed by: Ezequiel Essex Total critical care time: 30 Critical care time was exclusive of separately billable procedures and treating other patients. Critical care was necessary to treat or prevent imminent or life-threatening deterioration. Critical care was time spent personally by me on the following activities: development of treatment plan with patient and/or surrogate as well as nursing, discussions with consultants, evaluation of patient's response to treatment, examination of patient, obtaining history from patient or surrogate, ordering and performing treatments and interventions, ordering and review of laboratory studies, ordering and review of radiographic studies, pulse oximetry and re-evaluation of patient's condition.   Ezequiel Essex, MD 02/21/14 [phone]

## 2014-02-20 NOTE — ED Notes (Signed)
Pt waiting to

## 2014-02-20 NOTE — ED Notes (Signed)
Pt en route to IR at this time.

## 2014-02-20 NOTE — ED Notes (Addendum)
Pt transported to CT scan.

## 2014-02-20 NOTE — ED Notes (Addendum)
Multiple attempts to cannulate artery.  All vessels calcified. Unable to obtain access through femoral artery. Pt writhing everywhere.  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

## 2014-02-21 ENCOUNTER — Telehealth: Payer: Self-pay

## 2014-02-21 ENCOUNTER — Inpatient Hospital Stay (HOSPITAL_COMMUNITY): Payer: Medicare Other

## 2014-02-21 DIAGNOSIS — I519 Heart disease, unspecified: Secondary | ICD-10-CM

## 2014-02-21 LAB — LIPID PANEL
CHOLESTEROL: 159 mg/dL (ref 0–200)
HDL: 44 mg/dL (ref >39)
LDL Cholesterol: 92 mg/dL (ref 0–99)
Total CHOL/HDL Ratio: 3.6 RATIO
Triglycerides: 117 mg/dL (ref <150)
VLDL: 23 mg/dL (ref 0–40)

## 2014-02-21 LAB — MRSA PCR SCREENING: MRSA by PCR: NEGATIVE

## 2014-02-21 LAB — GLUCOSE, CAPILLARY
GLUCOSE-CAPILLARY: 154 mg/dL — AB (ref 70–99)
GLUCOSE-CAPILLARY: 193 mg/dL — AB (ref 70–99)
Glucose-Capillary: 127 mg/dL — ABNORMAL HIGH (ref 70–99)
Glucose-Capillary: 156 mg/dL — ABNORMAL HIGH (ref 70–99)
Glucose-Capillary: 108 mg/dL — ABNORMAL HIGH (ref 70–99)
Glucose-Capillary: 139 mg/dL — ABNORMAL HIGH (ref 70–99)
Glucose-Capillary: 131 mg/dL — ABNORMAL HIGH (ref 70–99)

## 2014-02-21 LAB — HEMOGLOBIN A1C
HEMOGLOBIN A1C: 8.2 % — AB (ref <5.7)
Mean Plasma Glucose: 189 mg/dL — ABNORMAL HIGH (ref <117)

## 2014-02-21 LAB — RAPID URINE DRUG SCREEN, HOSP PERFORMED
AMPHETAMINES: NOT DETECTED
BENZODIAZEPINES: POSITIVE — AB
Barbiturates: NOT DETECTED
Cocaine: NOT DETECTED
OPIATES: NOT DETECTED
TETRAHYDROCANNABINOL: NOT DETECTED

## 2014-02-21 MED ORDER — ASPIRIN 300 MG RE SUPP
300.0000 mg | Freq: Every day | RECTAL | Status: DC
  Administered 2014-02-21 – 2014-02-22 (×2): 300 mg via RECTAL
  Filled 2014-02-21 (×2): qty 1

## 2014-02-21 MED ORDER — INFLUENZA VAC SPLIT QUAD 0.5 ML IM SUSP
0.5000 mL | INTRAMUSCULAR | Status: DC
  Filled 2014-02-21 (×2): qty 0.5

## 2014-02-21 MED ORDER — ENOXAPARIN SODIUM 40 MG/0.4ML ~~LOC~~ SOLN
40.0000 mg | SUBCUTANEOUS | Status: DC
  Administered 2014-02-21 – 2014-03-02 (×10): 40 mg via SUBCUTANEOUS
  Filled 2014-02-21 (×10): qty 0.4

## 2014-02-21 MED ORDER — PNEUMOCOCCAL VAC POLYVALENT 25 MCG/0.5ML IJ INJ
0.5000 mL | INJECTION | INTRAMUSCULAR | Status: AC
  Administered 2014-02-25: 0.5 mL via INTRAMUSCULAR
  Filled 2014-02-21 (×2): qty 0.5

## 2014-02-21 NOTE — Evaluation (Signed)
Physical Therapy Evaluation Patient Details Name: [name] MRN: [id number] DOB: 22-Feb-1966 Today's Date: 02/21/2014 Time: [id number] PT Time Calculation (min): 19 min  PT Assessment / Plan / Recommendation History of Present Illness  [name] is a 48 y.o. female with a history of previous infarcts who presents with sudden onset aphasia earlier today. She was brought to the ED in [place] where a CT was performed which showed(per report) a new stroke that was not apparent on a CT done 12 days earlier. She was therefore not a tpa candidate and was transferred to [facility] for further evaluation. Here, she was found to be aphasic and a CTA was obtained suggesting left parietal branch M2 occlusion and therefore she was felt to be a possible intervention candidate.   Clinical Impression  Pt with bilat AKA's and L spastic UE who now presents with aphasia from recent L parietal branch infarct. Suspect pt maybe be functional near baseline from a mobility stand point as pt was w/c bound and dependent for all transfers. However pt was able to sit on commode without assist. Pt now with significant sitting balance impairment in addition to being emotionally labile and aphasic. Recommend SNF upon d/c to address deficits for safe transition home with friends.    PT Assessment  Patient needs continued PT services    Follow Up Recommendations  SNF;Supervision/Assistance - 24 hour    Does the patient have the potential to tolerate intense rehabilitation      Barriers to Discharge   unclear of true home support, has been at home for a month    Equipment Recommendations       Recommendations for Other Services     Frequency Min 4X/week    Precautions / Restrictions Precautions Precautions: Fall Restrictions Weight Bearing Restrictions:  (Bilat AKA)   Pertinent Vitals/Pain Grimaced in pain when attempting to range L UE      Mobility  Bed Mobility Overal bed mobility: Needs Assistance Bed  Mobility: Supine to Sit Supine to sit: Max assist General bed mobility comments: pt able to initiate roll with R UE. required assist for trunk elevation Transfers Overall transfer level: Needs assistance Equipment used:  (per family support pt dependent lift at home)    Exercises     PT Diagnosis: Hemiplegia non-dominant side  PT Problem List: Decreased strength;Decreased activity tolerance;Decreased balance;Decreased mobility PT Treatment Interventions: Functional mobility training;Therapeutic activities;Therapeutic exercise;Balance training;Neuromuscular re-education     PT Goals(Current goals can be found in the care plan section) Acute Rehab PT Goals Patient Stated Goal: didn't state PT Goal Formulation: With patient/family Time For Goal Achievement: 03/07/14 Potential to Achieve Goals: Fair  Visit Information  Last PT Received On: 02/21/14 Assistance Needed: +1 (+2 for OOB) History of Present Illness: [name] is a 49 y.o. female with a history of previous infarcts who presents with sudden onset aphasia earlier today. She was brought to the ED in [place] where a CT was performed which showed(per report) a new stroke that was not apparent on a CT done 12 days earlier. She was therefore not a tpa candidate and was transferred to [facility] for further evaluation. Here, she was found to be aphasic and a CTA was obtained suggesting left parietal branch M2 occlusion and therefore she was felt to be a possible intervention candidate.        Prior [place] expects to be discharged to:: Unsure Living Arrangements: Non-relatives/Friends Additional Comments: pt lives with family/friends who care for her 24/7  however pt now with speech deficits and further impaired balance Prior Function Level of Independence: Needs assistance Gait / Transfers Assistance Needed: pt non-amb due to bilat AKA. Pt does have manual and electric w/c's. Pt dependent for all  transfers. ADL's / Homemaking Assistance Needed: pt can do UB dressing but needs assist for LB dressing. pt able to feed self but dependent for meals to be made Communication Communication: Expressive difficulties Dominant Hand: Right    Cognition  Cognition Arousal/Alertness: Awake/alert Behavior During Therapy: Restless;Flat affect (emotionally labile) Overall Cognitive Status: Impaired/Different from baseline Area of Impairment: Safety/judgement;Attention;Following commands;Problem solving Current Attention Level: Sustained Following Commands: Follows one step commands with increased time Safety/Judgement: Decreased awareness of safety Problem Solving: Slow processing;Difficulty sequencing;Requires verbal cues;Requires tactile cues General Comments: pt with con't flailing of R UE but able to follow commands with R UE when asked    Extremity/Trunk Assessment Upper Extremity Assessment Upper Extremity Assessment: LUE deficits/detail LUE Deficits / Details: pt with spastic L hemiparesis LUE:  (increased tone) Lower Extremity Assessment Lower Extremity Assessment:  (bilat AKA) Cervical / Trunk Assessment Cervical / Trunk Assessment: Normal   Balance Balance Overall balance assessment: Needs assistance Sitting-balance support: Single extremity supported Sitting balance-Leahy Scale: Poor Sitting balance - Comments: pt with strong lateral sway, unable to balance at EOB.,maxA to maintain balance while reaching with R UE  End of Session PT - End of Session Activity Tolerance: Patient tolerated treatment well Patient left: in bed;with call bell/phone within reach;with family/visitor present Nurse Communication: Mobility status  GP     Kingsley Callander 02/21/2014, 4:40 PM  Kittie Plater, PT, DPT Pager #: [phone] Office #: [phone]

## 2014-02-21 NOTE — Progress Notes (Signed)
Stroke Team Progress Note  HISTORY [name] is a 48 y.o. female with a history of previous infarcts who presents with sudden onset aphasia earlier today 02/20/2014 at 1400. She was brought to the ED in [place] where a CT was performed which showed(per report) a new stroke that was not apparent on a CT done 12 days earlier. She was therefore not a tpa candidate and was transferred to [facility] 02/20/2014 1708 for further evaluation. Here, she was found to be aphasic and a CTA was obtained suggesting left parietal branch M2 occlusion and therefore she was felt to be a possible intervention candidate.  She has a history of multiple strokes with left sided weakness. Patient was not administerd TPA . She was admitted for further evaluation and treatment.  SUBJECTIVE Her cousin is at the bedside.  Overall she feels her condition is unchanged.   OBJECTIVE Most recent Vital Signs: Filed Vitals:   02/21/14 0200 02/21/14 0417 02/21/14 0607 02/21/14 0806  BP: 135/60 136/63 115/50 117/55  Pulse: 88 99 100   Temp: 98.8 F (37.1 C) 98.5 F (36.9 C) 99.1 F (37.3 C) 98.8 F (37.1 C)  TempSrc: Oral Oral Oral Oral  Resp: 20 21 20 17   Height:      Weight:      SpO2: 97% 98% 95% 96%   CBG (last 3)   Recent Labs  02/20/14 2154 02/21/14 0040 02/21/14 0334  GLUCAP 214* 154* 127*    IV Fluid Intake:   . sodium chloride 1,000 mL (02/20/14 2310)    MEDICATIONS  . atorvastatin  20 mg Oral q1800  . carvedilol  3.125 mg Oral BID WC  . digoxin  0.125 mg Oral Q M,W,F  . DULoxetine  60 mg Oral BID  . [START ON 02/22/2014] influenza vac split quadrivalent PF  0.5 mL Intramuscular Tomorrow-1000  . insulin aspart  2-6 Units Subcutaneous 6 times per day  . pantoprazole (PROTONIX) IV  40 mg Intravenous QHS  . [START ON 02/22/2014] pneumococcal 23 valent vaccine  0.5 mL Intramuscular Tomorrow-1000  . ranolazine  500 mg Oral BID   PRN:    Diet:  NPO  Activity:  Bedrest DVT Prophylaxis:  none  CLINICALLY  SIGNIFICANT STUDIES Basic Metabolic Panel:   Recent Labs Lab 02/20/14 1725 02/20/14 1748  NA 139 140  K 3.9 3.7  CL 100 102  CO2 23  --   GLUCOSE 252* 261*  BUN 9 7  CREATININE 0.28* 0.30*  CALCIUM 9.5  --    Liver Function Tests:   Recent Labs Lab 02/20/14 1725  AST 11  ALT 14  ALKPHOS 122*  BILITOT 0.2*  PROT 7.6  ALBUMIN 3.5   CBC:   Recent Labs Lab 02/20/14 1725 02/20/14 1748  WBC 12.2*  --   NEUTROABS 9.4*  --   HGB 13.2 14.3  HCT 39.2 42.0  MCV 83.6  --   PLT 319  --    Coagulation:   Recent Labs Lab 02/20/14 1725  LABPROT 12.5  INR 0.95   Cardiac Enzymes: No results found for this basename: CKTOTAL, CKMB, CKMBINDEX, TROPONINI,  in the last 168 hours Urinalysis:   Recent Labs Lab 02/20/14 2320  COLORURINE YELLOW  LABSPEC 1.034*  PHURINE 7.5  GLUCOSEU 500*  HGBUR NEGATIVE  BILIRUBINUR NEGATIVE  KETONESUR 15*  PROTEINUR NEGATIVE  UROBILINOGEN 0.2  NITRITE NEGATIVE  LEUKOCYTESUR NEGATIVE   Lipid Panel    Component Value Date/Time   CHOL 159 02/21/2014 0255   TRIG  117 02/21/2014 0255   HDL 44 02/21/2014 0255   CHOLHDL 3.6 02/21/2014 0255   VLDL 23 02/21/2014 0255   LDLCALC 92 02/21/2014 0255   HgbA1C  No results found for this basename: HGBA1C    Urine Drug Screen:     Component Value Date/Time   LABOPIA NONE DETECTED 02/20/2014 2319   COCAINSCRNUR NONE DETECTED 02/20/2014 2319   LABBENZ POSITIVE* 02/20/2014 2319   AMPHETMU NONE DETECTED 02/20/2014 2319   THCU NONE DETECTED 02/20/2014 2319   LABBARB NONE DETECTED 02/20/2014 2319    Alcohol Level:   Recent Labs Lab 02/20/14 1725  ETH <11   CT of the brain    CT angio head and neck  02/20/2014    Acute left MCA infarct with progressive edema in the left insula. CTA suboptimal but does reveal thrombus in the left M2 segment.  Large right chronic MCA infarct with possible extension over the convexity.  Severe intracranial atherosclerotic disease with diffuse vessel calcification and  atherosclerotic irregularity.  Atherosclerotic calcification in the carotid bifurcation bilaterally. There appears to be 75 % stenosis on the right and mild stenosis on the left although quantification of stenosis is not possible due to motion and suboptimal arterial enhancement.   Cerebral Angiogram 02/20/2014 L CFA approach. 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 Procedure stopped.  MRI of the brain    MRA of the brain    2D Echocardiogram    Carotid Doppler    CXR  02/21/2014   Borderline cardiomegaly, no acute pulmonary process.    EKG  sinus tachycardia. For complete results please see formal report.   Therapy Recommendations   Physical Exam   Frail middle aged lady not in distress. Has bilateral above knee amputations.Awake alert. Afebrile. Head is nontraumatic. Neck is supple without bruit. Hearing is normal. Cardiac exam no murmur or gallop. Lungs are clear to auscultation. Distal pulses are not felt in lower extremities.. Neurological Exam : Awake alert. Expressive greater than receptive aphasia. She speaks a few words only. Follows simple midline and one-step commands only. Left gaze preference but can look to the right past midline. Blinks to threat on the right but not somnolent. Left lower facial weakness. Tongue midline. Fundi not visualized. Left approximating 3/5 weakness with spasticity and increased tone. Mild left hip flexor weakness. Normal strength on the right and able to move right upper extremity and right thigh against   gravity. Plantars cannot be checked. Sensation is intact. Gait cannot be tested. ASSESSMENT [name] is a 48 y.o. female presenting with sudden onset expressive aphasia. Imaging confirms an old right MCA infarct and a new left MCA branch insular infarct in this vasculopath. New infarct  felt to be thrombotic large vessel disease. 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 On aspirin 81 mg orally every day and clopidogrel 75 mg orally every day prior to admission. Now on no antithrombotics for secondary stroke prevention. Patient with resultant global aphasia. Work up underway.  Hypertension Diabetes, HgbA1c pending, goal < 7.0  LDL 92  COPD  Obesity, Body mass index is 34.86 kg/(m^2).   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 Recommend aspirin 81 mg and  plavix 75 mg daily x 3 months then one alone once able to swallow  VTE prophylaxis  F/u HgbA1c  Check swallow, therapy evals  Complete stroke workup - MRI, MRA, 2D, CD, HgbA1c  SHARON BIBY, MSN, RN, ANVP-BC, ANP-BC, GNP-BC [facility] Pager: [phone] 02/21/2014 9:29 AM  I have personally obtained a history, examined the patient, evaluated imaging results, and formulated the assessment and plan of care. I agree with the above. Antony Contras, MD

## 2014-02-21 NOTE — Progress Notes (Signed)
Inpatient Diabetes Program Recommendations  AACE/ADA: New Consensus Statement on Inpatient Glycemic Control (2013)  Target Ranges:  Prepandial:   less than 140 mg/dL      Peak postprandial:   less than 180 mg/dL (1-2 hours)      Critically ill patients:  140 - 180 mg/dL   Reason for Visit: Results for [name], [name] (MRN [id number]) as of 02/21/2014 11:57  Ref. Range 02/20/2014 21:54 02/21/2014 00:40 02/21/2014 03:34  Glucose-Capillary Latest Range: 70-99 mg/dL 214 (H) 154 (H) 127 (H)    Diabetes history: Diabetes Outpatient Diabetes medications: Levemir 30 units q HS, Novolog 15 units tid with meals Current orders for Inpatient glycemic control: ICU glycemic control order set-Novolog 2-4-6 q 4  Hours  Please check A1C to determine pre-hospitalization glycemic control.  Please consider changing Novolog correction to sensitive q 4 hours. Will likely need basal insulin restarted.   Will follow.  Adah Perl, RN, BC-ADM Inpatient Diabetes Coordinator Pager [phone]

## 2014-02-21 NOTE — Progress Notes (Signed)
Echocardiogram 2D Echocardiogram has been performed.  [name] 02/21/2014, 9:58 AM

## 2014-02-21 NOTE — Progress Notes (Signed)
Utilization review completed.  

## 2014-02-22 ENCOUNTER — Inpatient Hospital Stay (HOSPITAL_COMMUNITY): Payer: Medicare Other

## 2014-02-22 LAB — GLUCOSE, CAPILLARY
GLUCOSE-CAPILLARY: 146 mg/dL — AB (ref 70–99)
GLUCOSE-CAPILLARY: 108 mg/dL — AB (ref 70–99)
Glucose-Capillary: 100 mg/dL — ABNORMAL HIGH (ref 70–99)
Glucose-Capillary: 113 mg/dL — ABNORMAL HIGH (ref 70–99)

## 2014-02-22 MED ORDER — FENTANYL 25 MCG/HR TD PT72
50.0000 ug | MEDICATED_PATCH | TRANSDERMAL | Status: DC
  Administered 2014-02-22 – 2014-02-28 (×3): 50 ug via TRANSDERMAL
  Filled 2014-02-22: qty 2
  Filled 2014-02-22: qty 1
  Filled 2014-02-22: qty 2

## 2014-02-22 MED ORDER — INSULIN ASPART 100 UNIT/ML ~~LOC~~ SOLN
0.0000 [IU] | Freq: Three times a day (TID) | SUBCUTANEOUS | Status: DC
  Administered 2014-02-24: 1 [IU] via SUBCUTANEOUS
  Administered 2014-02-25: 2 [IU] via SUBCUTANEOUS
  Administered 2014-02-25 (×2): 1 [IU] via SUBCUTANEOUS
  Administered 2014-02-26 – 2014-02-27 (×2): 2 [IU] via SUBCUTANEOUS
  Administered 2014-02-28: 1 [IU] via SUBCUTANEOUS
  Administered 2014-02-28 – 2014-03-01 (×4): 2 [IU] via SUBCUTANEOUS
  Administered 2014-03-02 (×2): 3 [IU] via SUBCUTANEOUS

## 2014-02-22 MED ORDER — DOCUSATE SODIUM 100 MG PO CAPS: 100.0000 mg | ORAL_CAPSULE | Freq: Two times a day (BID) | ORAL | Status: DC | PRN

## 2014-02-22 MED ORDER — ASPIRIN EC 81 MG PO TBEC: 81.0000 mg | DELAYED_RELEASE_TABLET | Freq: Every day | ORAL | Status: DC

## 2014-02-22 MED ORDER — FUROSEMIDE 20 MG PO TABS
20.0000 mg | ORAL_TABLET | Freq: Every day | ORAL | Status: DC | PRN
  Filled 2014-02-22: qty 1

## 2014-02-22 MED ORDER — PREGABALIN 50 MG PO CAPS
50.0000 mg | ORAL_CAPSULE | Freq: Three times a day (TID) | ORAL | Status: DC
  Administered 2014-02-22 – 2014-03-02 (×20): 50 mg via ORAL
  Filled 2014-02-22 (×20): qty 1

## 2014-02-22 MED ORDER — CLOPIDOGREL BISULFATE 75 MG PO TABS
75.0000 mg | ORAL_TABLET | Freq: Every day | ORAL | Status: DC
  Administered 2014-02-23 – 2014-03-02 (×7): 75 mg via ORAL
  Filled 2014-02-22 (×11): qty 1

## 2014-02-22 MED ORDER — INSULIN DETEMIR 100 UNIT/ML ~~LOC~~ SOLN
30.0000 [IU] | Freq: Every day | SUBCUTANEOUS | Status: DC
  Administered 2014-02-22: 30 [IU] via SUBCUTANEOUS
  Filled 2014-02-22 (×3): qty 0.3

## 2014-02-22 MED ORDER — MAGNESIUM OXIDE 400 MG PO TABS: 400.0000 mg | ORAL_TABLET | Freq: Every day | ORAL | Status: DC

## 2014-02-22 MED ORDER — ASPIRIN EC 81 MG PO TBEC
81.0000 mg | DELAYED_RELEASE_TABLET | Freq: Every day | ORAL | Status: DC
  Administered 2014-02-24 – 2014-03-02 (×7): 81 mg via ORAL
  Filled 2014-02-22 (×8): qty 1

## 2014-02-22 MED ORDER — MAGNESIUM OXIDE 400 (241.3 MG) MG PO TABS
400.0000 mg | ORAL_TABLET | Freq: Every day | ORAL | Status: DC
  Administered 2014-02-22 – 2014-03-02 (×8): 400 mg via ORAL
  Filled 2014-02-22 (×9): qty 1

## 2014-02-22 MED ORDER — LORAZEPAM 2 MG/ML IJ SOLN
INTRAMUSCULAR | Status: AC
  Administered 2014-02-22: 1 mg via INTRAVENOUS
  Filled 2014-02-22: qty 1

## 2014-02-22 MED ORDER — LORAZEPAM 2 MG/ML IJ SOLN
1.0000 mg | Freq: Once | INTRAMUSCULAR | Status: AC
  Administered 2014-02-22: 1 mg via INTRAVENOUS

## 2014-02-22 MED ORDER — NICOTINE 21 MG/24HR TD PT24
21.0000 mg | MEDICATED_PATCH | TRANSDERMAL | Status: DC
  Administered 2014-02-22 – 2014-03-02 (×9): 21 mg via TRANSDERMAL
  Filled 2014-02-22 (×9): qty 1

## 2014-02-22 MED ORDER — MIDODRINE HCL 5 MG PO TABS
5.0000 mg | ORAL_TABLET | Freq: Three times a day (TID) | ORAL | Status: DC
  Administered 2014-02-22 – 2014-03-02 (×23): 5 mg via ORAL
  Filled 2014-02-22 (×27): qty 1

## 2014-02-22 NOTE — Progress Notes (Signed)
CSW informed that PT recommending SNF for patient. CSW attempted to assess patient, patient not in room- taken to MRI. CSW will assess at later time.  Tilden Fossa, MSW, [place] Social Worker [phone]

## 2014-02-22 NOTE — Progress Notes (Signed)
Clinical Social Work Department BRIEF PSYCHOSOCIAL ASSESSMENT 02/22/2014  Patient:  [name], [name]     Account Number:  [id number]     [place] date:  02/20/2014  Clinical Social Worker:  Su Monks  Date/Time:  02/22/2014 04:20 PM  Referred by:  Physician  Date Referred:  02/22/2014 Referred for  SNF Placement   Other Referral:   Interview type:  Patient Other interview type:    PSYCHOSOCIAL DATA Living Status:  FAMILY Admitted from facility:   Level of care:   Primary support name:  Barnett Abu [phone] Primary support relationship to patient:  CHILD, ADULT Degree of support available:   Unknown    CURRENT CONCERNS Current Concerns  Post-Acute Placement   Other Concerns:    SOCIAL WORK ASSESSMENT / PLAN CSW informed that PT recommending SNF for rehab at discharge. CSW met with patient to discuss discharge plans. CSW introduced self and explained role. CSW explained to patient that PT is recommending SNF at discharge. Patient was cooperative during assessment but not able to communicate clearly at this time. Patient was able to nod and say yes/no. Patient appears to be agreeable to SNF, CSW will confirm with family and fax out to local facilities. From patient's address in hospital records, patient may reside in [place].   Assessment/plan status:  Information/Referral to [employer] Other assessment/ plan:   Information/referral to community resources:   CSW will confirm SNF search with family and intiate SNF search in preferred [place].    PATIENT'S/FAMILY'S RESPONSE TO PLAN OF CARE: Patient appears agreeable to SNF during conversation but due to her limited ability to verbally communicate at this time, CSW will confirm with family. Patient may need SNF rehab before returning home with family.    Tilden Fossa, MSW, [place] Social Worker [phone]

## 2014-02-22 NOTE — Evaluation (Signed)
Clinical/Bedside Swallow Evaluation Patient Details  Name: [name] MRN: [id number] Date of Birth: 1966/01/11  Today's Date: 02/22/2014 Time: 1215-1235 SLP Time Calculation (min): 20 min  Past Medical History:  Past Medical History  Diagnosis Date  . Stroke   . MI (myocardial infarction)   . Hypertension   . Asthma   . COPD (chronic obstructive pulmonary disease)   . GERD (gastroesophageal reflux disease)   . Anemia   . Diabetes mellitus without complication   . Anxiety    Past Surgical History:  Past Surgical History  Procedure Laterality Date  . Above knee leg amputation Bilateral   . Angioplasty     HPI:  48 year old female admitted 02/20/14 due to sudden onset of aphasia.  PMH significant for multiple CVAs, MI, HTN, COPD, GERD, DM, B AKA.  Orders received for BSE to determine least restrictive diet.   Assessment / Plan / Recommendation Clinical Impression  Oral care completed with suction.  Pt is edentulous.  Uncertain if pt has dentures.  Pt presents with generalized oral motor weakness, however, no oral leakage or pocketing was noted with any consistency.  Pt exhibited reflexive cough on the first bolus of thin liquid, but did not have subsequent cough response despite multiple presentations.  Pt is at increased risk of (silent) aspiration given history of COPD and multiple CVAs.  Silent aspiration cannot be detected at bedside, so objective study may be beneficial.  At this time, will recommend dys 2 diet with thin liquids, meds one at a time with liquid, and strict adherence to posted precautions.  ST to follow for diet tolerance and readiness to advance.    Aspiration Risk  Moderate    Diet Recommendation Dysphagia 2 (Fine chop);Thin liquid   Liquid Administration via: Straw Medication Administration: Whole meds with liquid (1 at a time) Supervision: Staff to assist with self feeding;Full supervision/cueing for compensatory strategies Compensations: Slow  rate;Small sips/bites Postural Changes and/or Swallow Maneuvers: Seated upright 90 degrees;Upright 30-60 min after meal    Other  Recommendations Oral Care Recommendations: Oral care Q4 per protocol Other Recommendations: Clarify dietary restrictions   Follow Up Recommendations  24 hour supervision/assistance    Frequency and Duration min 2x/week  2 weeks   Pertinent Vitals/Pain VSS, no pain reported    SLP Swallow Goals  see care plan   Swallow Study Prior Functional Status  Cognitive/Linguistic Baseline: Information not available Education: high school grad per pt Vocation: Unemployed    General Date of Onset: 02/20/14 HPI: 48 year old female admitted 02/20/14 due to sudden onset of aphasia.  PMH significant for multiple CVAs, MI, HTN, COPD, GERD, DM, B AKA.  Orders received for BSE to determine least restrictive diet. Type of Study: Bedside swallow evaluation Previous Swallow Assessment: none found Diet Prior to this Study: NPO Temperature Spikes Noted: No Respiratory Status: Room air History of Recent Intubation: No Behavior/Cognition: Alert;Cooperative;Pleasant mood;Doesn't follow directions Oral Cavity - Dentition: Edentulous Self-Feeding Abilities: Total assist;Able to feed self (currently in restraints) Patient Positioning: Upright in bed Baseline Vocal Quality: Clear;Low vocal intensity Volitional Cough: Strong Volitional Swallow: Able to elicit    Oral/Motor/Sensory Function Overall Oral Motor/Sensory Function: Impaired Labial ROM: Reduced left;Reduced right Labial Symmetry: Abnormal symmetry right;Abnormal symmetry left Labial Strength: Reduced Labial Sensation: Within Functional Limits Lingual ROM: Reduced right;Reduced left Lingual Symmetry: Abnormal symmetry right;Abnormal symmetry left Lingual Strength: Reduced Lingual Sensation: Within Functional Limits Facial ROM: Reduced right;Reduced left Facial Symmetry: Right droop;Left droop Facial Strength:  Reduced  Facial Sensation: Within Functional Limits Velum: Within Functional Limits Mandible: Within Functional Limits   Ice Chips Ice chips: Not tested   Thin Liquid Thin Liquid: Impaired Presentation: Straw Pharyngeal  Phase Impairments: Cough - Immediate (x1 - on initial bolus only)    Nectar Thick Nectar Thick Liquid: Not tested   Honey Thick Honey Thick Liquid: Not tested   Puree Puree: Within functional limits Presentation: Spoon   Solid   GO    Solid: Within functional limits      Shironda Kain B. Quentin Ore [facility], [place] [phone]  Shonna Chock 02/22/2014,12:52 PM

## 2014-02-22 NOTE — Progress Notes (Addendum)
Pt to [place] to 4N-10, VSS, called report. Called family to inform [place].

## 2014-02-22 NOTE — Clinical Documentation Improvement (Signed)
Presents with an acute left MCA infarct with "progressive edema in left insula" noted in CT of brain. Cerebral edema not seen on Problem List and coders can't code from Radiology reports.  If clinically significant, please document your findings in next progress note and discharge summary; treatment plan if indicated.    Thank You, Zoila Shutter ,RN Clinical Documentation Specialist:  [phone]; Cell  [phone] [place] [employer]

## 2014-02-22 NOTE — Progress Notes (Signed)
OT Cancellation Note  Patient Details Name: [name] MRN: [id number] DOB: September 23, 1966   Cancelled Treatment:    Reason Eval/Treat Not Completed: Patient not medically ready (iv ativan at MRI). RN reporting patient very lethargic due to ativan.  Peri Maris Pager: [phone]  02/22/2014, 2:56 PM

## 2014-02-22 NOTE — Progress Notes (Signed)
Pt arrived to MRI for scan.  Pt unable to cooperate for exam so Megan the RN was called and got an order for IV ativan.  Med was administered without any appreciable effect on the patient.  Pt was sent back to floor.  Only diffusion images were acquired and they will be turned in as they do have some diagnostic information.

## 2014-02-22 NOTE — Evaluation (Signed)
Speech Language Pathology Evaluation Patient Details Name: [name] MRN: [id number] DOB: Aug 21, 1966 Today's Date: 02/22/2014 Time: [id number] SLP Time Calculation (min): 15 min  Problem List:  Patient Active Problem List   Diagnosis Date Noted  . Stroke 02/20/2014  . CVA (cerebral infarction) 02/20/2014   Past Medical History:  Past Medical History  Diagnosis Date  . Stroke   . MI (myocardial infarction)   . Hypertension   . Asthma   . COPD (chronic obstructive pulmonary disease)   . GERD (gastroesophageal reflux disease)   . Anemia   . Diabetes mellitus without complication   . Anxiety    Past Surgical History:  Past Surgical History  Procedure Laterality Date  . Above knee leg amputation Bilateral   . Angioplasty     HPI:  48 year old female admitted 02/20/14 due to sudden onset of aphasia.  PMH significant for multiple CVAs, MI, HTN, COPD, GERD, DM, B AKA.  Orders received for evaluation of Comm-Cog status.   Assessment / Plan / Recommendation Clinical Impression  Pt's baseline level of function is currently unknown.  Currently, pt has difficulty answering yes/no questions accurately, and does not follow one-step commands consistently,  She is able to verbalize responses, but is slow to answer.  She tends to perseverate, and limits responses to just a few words at a time.  She is able to name common objects, however, higher level naming tasks are anticipated to be impaired.  Cognition is not able to be fully assessed at this time, due to impairment of receptive and expressive language.  ST to follow for remediation of language skills to improve functional and effective communication.    SLP Assessment  Patient needs continued Speech Lanaguage Pathology Services    Follow Up Recommendations  24 hour supervision/assistance    Frequency and Duration min 2x/week  2 weeks   Pertinent Vitals/Pain VSS, no pain reported   SLP Goals  SLP Goals Potential to Achieve  Goals: Fair Potential Considerations: Previous level of function;Family/community support  SLP Evaluation Prior Functioning  Cognitive/Linguistic Baseline: Information not available Education: high school grad per pt Vocation: Unemployed   Cognition  Arousal/Alertness: Awake/alert    Comprehension  Auditory Comprehension Overall Auditory Comprehension: Impaired Yes/No Questions: Impaired Basic Biographical Questions: 26-50% accurate Commands: Impaired One Step Basic Commands: 50-74% accurate Conversation: Simple Visual Recognition/Discrimination Discrimination: Not tested Reading Comprehension Reading Status: Not tested    Expression Expression Primary Mode of Expression: Verbal Verbal Expression Overall Verbal Expression: Impaired Initiation: Impaired Automatic Speech: Name;Counting;Day of week (perseverative) Level of Generative/Spontaneous Verbalization: Phrase;Word Repetition: No impairment (slow to respond) Level of Impairment: Sentence level Naming: No impairment Written Expression Dominant Hand: Right Written Expression: Not tested   Oral / Motor Oral Motor/Sensory Function Overall Oral Motor/Sensory Function: Impaired Labial ROM: Reduced left;Reduced right Labial Symmetry: Abnormal symmetry right;Abnormal symmetry left Labial Strength: Reduced Labial Sensation: Within Functional Limits Lingual ROM: Reduced right;Reduced left Lingual Symmetry: Abnormal symmetry left;Abnormal symmetry right Lingual Strength: Reduced Lingual Sensation: Within Functional Limits Facial ROM: Reduced right;Reduced left Facial Symmetry: Right droop;Left droop Facial Strength: Reduced Facial Sensation: Within Functional Limits Velum: Within Functional Limits Mandible: Within Functional Limits Motor Speech Overall Motor Speech: Impaired Respiration: Within functional limits Phonation: Normal Resonance: Within functional limits Articulation: Impaired Level of Impairment:  Sentence Intelligibility: Intelligibility reduced Word: 75-100% accurate Phrase: 75-100% accurate Sentence: 75-100% accurate Motor Planning: Witnin functional limits   GO    Bryer Cozzolino B. Quentin Ore [facility], [place]  Shonna Chock  02/22/2014, 12:40 PM

## 2014-02-22 NOTE — Progress Notes (Signed)
VASCULAR LAB PRELIMINARY  PRELIMINARY  PRELIMINARY  PRELIMINARY  Carotid duplex completed.    Preliminary report:  Bilateral:  1-39% ICA stenosis.  Vertebral artery flow is antegrade.     Dantae Meunier, [place], RVS 02/22/2014, 5:08 PM

## 2014-02-22 NOTE — Progress Notes (Signed)
Stroke Team Progress Note  HISTORY [name] is a 48 y.o. female with a history of previous infarcts who presents with sudden onset aphasia earlier today 02/20/2014 at 1400. She was brought to the ED in [place] where a CT was performed which showed(per report) a new stroke that was not apparent on a CT done 12 days earlier. She was therefore not a tpa candidate and was transferred to [facility] 02/20/2014 1708 for further evaluation. Here, she was found to be aphasic and a CTA was obtained suggesting left parietal branch M2 occlusion and therefore she was felt to be a possible intervention candidate.  She has a history of multiple strokes with left sided weakness. Patient was not administerd TPA . She was admitted for further evaluation and treatment.  SUBJECTIVE No family at bedside. Patient alert and interactive with physician.  OBJECTIVE Most recent Vital Signs: Filed Vitals:   02/21/14 2002 02/21/14 2330 02/22/14 0422 02/22/14 0800  BP: 135/71 116/56 119/59 127/57  Pulse: 77 81 87   Temp: 97.6 F (36.4 C) 97.6 F (36.4 C) 98.1 F (36.7 C)   TempSrc: Oral Oral Oral   Resp: 21  20   Height:      Weight:      SpO2: 100% 100% 99%    CBG (last 3)   Recent Labs  02/21/14 2327 02/22/14 0424 02/22/14 0754  GLUCAP 131* 100* 113*    IV Fluid Intake:   . sodium chloride 75 mL/hr at 02/21/14 2300    MEDICATIONS  . aspirin  300 mg Rectal Daily  . atorvastatin  20 mg Oral q1800  . carvedilol  3.125 mg Oral BID WC  . digoxin  0.125 mg Oral Q M,W,F  . DULoxetine  60 mg Oral BID  . enoxaparin (LOVENOX) injection  40 mg Subcutaneous Q24H  . influenza vac split quadrivalent PF  0.5 mL Intramuscular Tomorrow-1000  . insulin aspart  2-6 Units Subcutaneous 6 times per day  . pantoprazole (PROTONIX) IV  40 mg Intravenous QHS  . pneumococcal 23 valent vaccine  0.5 mL Intramuscular Tomorrow-1000  . ranolazine  500 mg Oral BID   PRN:    Diet:  NPO  Activity:  Bedrest DVT Prophylaxis:   Lovenox 40 mg sq daily   CLINICALLY SIGNIFICANT STUDIES Basic Metabolic Panel:   Recent Labs Lab 02/20/14 1725 02/20/14 1748  NA 139 140  K 3.9 3.7  CL 100 102  CO2 23  --   GLUCOSE 252* 261*  BUN 9 7  CREATININE 0.28* 0.30*  CALCIUM 9.5  --    Liver Function Tests:   Recent Labs Lab 02/20/14 1725  AST 11  ALT 14  ALKPHOS 122*  BILITOT 0.2*  PROT 7.6  ALBUMIN 3.5   CBC:   Recent Labs Lab 02/20/14 1725 02/20/14 1748  WBC 12.2*  --   NEUTROABS 9.4*  --   HGB 13.2 14.3  HCT 39.2 42.0  MCV 83.6  --   PLT 319  --    Coagulation:   Recent Labs Lab 02/20/14 1725  LABPROT 12.5  INR 0.95   Cardiac Enzymes: No results found for this basename: CKTOTAL, CKMB, CKMBINDEX, TROPONINI,  in the last 168 hours Urinalysis:   Recent Labs Lab 02/20/14 2320  COLORURINE YELLOW  LABSPEC 1.034*  PHURINE 7.5  GLUCOSEU 500*  HGBUR NEGATIVE  BILIRUBINUR NEGATIVE  KETONESUR 15*  PROTEINUR NEGATIVE  UROBILINOGEN 0.2  NITRITE NEGATIVE  LEUKOCYTESUR NEGATIVE   Lipid Panel    Component Value  Date/Time   CHOL 159 02/21/2014 0255   TRIG 117 02/21/2014 0255   HDL 44 02/21/2014 0255   CHOLHDL 3.6 02/21/2014 0255   VLDL 23 02/21/2014 0255   LDLCALC 92 02/21/2014 0255   HgbA1C  Lab Results  Component Value Date   HGBA1C 8.2* 02/21/2014    Urine Drug Screen:     Component Value Date/Time   LABOPIA NONE DETECTED 02/20/2014 2319   COCAINSCRNUR NONE DETECTED 02/20/2014 2319   LABBENZ POSITIVE* 02/20/2014 2319   AMPHETMU NONE DETECTED 02/20/2014 2319   THCU NONE DETECTED 02/20/2014 2319   LABBARB NONE DETECTED 02/20/2014 2319    Alcohol Level:   Recent Labs Lab 02/20/14 1725  ETH <11    CT angio head and neck  02/20/2014    Acute left MCA infarct with progressive edema in the left insula. CTA suboptimal but does reveal thrombus in the left M2 segment.  Large right chronic MCA infarct with possible extension over the convexity.  Severe intracranial atherosclerotic disease  with diffuse vessel calcification and atherosclerotic irregularity.  Atherosclerotic calcification in the carotid bifurcation bilaterally. There appears to be 75 % stenosis on the right and mild stenosis on the left although quantification of stenosis is not possible due to motion and suboptimal arterial enhancement.   Cerebral Angiogram 02/20/2014 L CFA approach. 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 Procedure stopped.  MRI of the brain    MRA of the brain    2D Echocardiogram  Technically difficult study with poor acoustic windows. Normal LV size with low normal to mildly reduced systolic function, EF [id number]. In some views, there appeared to be septal and possibly apical hypokinesis but difficult images. Normal RV size and systolic function. TEE would help with assess LV function and wall motion.   Carotid Doppler    CXR  02/21/2014   Borderline cardiomegaly, no acute pulmonary process.    EKG  sinus tachycardia. For complete results please see formal report.   Therapy Recommendations   Physical Exam   Frail middle aged lady not in distress. Has bilateral above knee amputations.Awake alert. Afebrile. Head is nontraumatic. Neck is supple without bruit. Hearing is normal. Cardiac exam no murmur or gallop. Lungs are clear to auscultation. Distal pulses are not felt in lower extremities.. Neurological Exam : Awake alert. Expressive greater than receptive aphasia. She speaks short sentences only. Follows simple midline and one-step commands only. Left gaze preference but can look to the right past midline. Blinks to threat on the right but not somnolent. Left lower facial weakness. Tongue midline. Fundi not visualized. Left approximating 3/5 weakness with spasticity and increased tone. Mild left hip flexor weakness. Normal strength on the  right and able to move right upper extremity and right thigh against   gravity. Plantars cannot be checked. Sensation is intact. Gait cannot be tested.  ASSESSMENT Ms. [name] is a 48 y.o. female presenting with sudden onset expressive aphasia. Imaging confirms an old right MCA infarct and a new left MCA branch insular infarct in this vasculopath. 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 MRI pending. On aspirin 81 mg orally every day and clopidogrel 75 mg orally every day prior to admission. Now on aspirin 300 mg suppository daily for secondary stroke prevention. Patient with resultant global aphasia. Work up underway.  Hypertension Diabetes,  HgbA1c 8.2, goal < 7.0  LDL 92  COPD  Obesity, Body mass index is 34.86 kg/(m^2).   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 Recommend aspirin 81 mg and plavix 75 mg daily once able to swallow  Check swallow, therapy evals  Complete stroke workup - MRI, MRA, CD  Social worker for SNF placement  Restart home medicines. Recommendations per diabetic nurse.  Burnetta Sabin, MSN, RN, ANVP-BC, ANP-BC, [facility] Pager: [phone] 02/22/2014 8:26 AM  I have personally obtained a history, examined the patient, evaluated imaging results, and formulated the assessment and plan of care. I agree with the above.  Antony Contras, MD

## 2014-02-23 ENCOUNTER — Inpatient Hospital Stay (HOSPITAL_COMMUNITY): Payer: Medicare Other

## 2014-02-23 LAB — GLUCOSE, CAPILLARY
GLUCOSE-CAPILLARY: 168 mg/dL — AB (ref 70–99)
GLUCOSE-CAPILLARY: 74 mg/dL (ref 70–99)
GLUCOSE-CAPILLARY: 68 mg/dL — AB (ref 70–99)
GLUCOSE-CAPILLARY: 109 mg/dL — AB (ref 70–99)
Glucose-Capillary: 135 mg/dL — ABNORMAL HIGH (ref 70–99)
Glucose-Capillary: 85 mg/dL (ref 70–99)
Glucose-Capillary: 73 mg/dL (ref 70–99)
Glucose-Capillary: 173 mg/dL — ABNORMAL HIGH (ref 70–99)
Glucose-Capillary: 102 mg/dL — ABNORMAL HIGH (ref 70–99)

## 2014-02-23 LAB — CBC
HCT: 37.3 % (ref 36.0–46.0)
Hemoglobin: 12.5 g/dL (ref 12.0–15.0)
MCH: 27.8 pg (ref 26.0–34.0)
MCHC: 33.5 g/dL (ref 30.0–36.0)
MCV: 83.1 fL (ref 78.0–100.0)
PLATELETS: 331 10*3/uL (ref 150–400)
RBC: 4.49 MIL/uL (ref 3.87–5.11)
RDW: 17.7 % — AB (ref 11.5–15.5)
WBC: 11.1 10*3/uL — ABNORMAL HIGH (ref 4.0–10.5)

## 2014-02-23 LAB — URINALYSIS, ROUTINE W REFLEX MICROSCOPIC
Glucose, UA: 500 mg/dL — AB
Ketones, ur: 40 mg/dL — AB
LEUKOCYTES UA: NEGATIVE
NITRITE: NEGATIVE
PROTEIN: NEGATIVE mg/dL
SPECIFIC GRAVITY, URINE: 1.023 (ref 1.005–1.030)
UROBILINOGEN UA: 0.2 mg/dL (ref 0.0–1.0)
pH: 5.5 (ref 5.0–8.0)

## 2014-02-23 LAB — BASIC METABOLIC PANEL
BUN: 9 mg/dL (ref 6–23)
CALCIUM: 8.9 mg/dL (ref 8.4–10.5)
CO2: 18 mEq/L — ABNORMAL LOW (ref 19–32)
Chloride: 103 mEq/L (ref 96–112)
Creatinine, Ser: 0.31 mg/dL — ABNORMAL LOW (ref 0.50–1.10)
Glucose, Bld: 71 mg/dL (ref 70–99)
Potassium: 3.4 mEq/L — ABNORMAL LOW (ref 3.7–5.3)
SODIUM: 139 meq/L (ref 137–147)

## 2014-02-23 LAB — URINE MICROSCOPIC-ADD ON

## 2014-02-23 MED ORDER — INSULIN DETEMIR 100 UNIT/ML ~~LOC~~ SOLN
15.0000 [IU] | Freq: Every day | SUBCUTANEOUS | Status: DC
  Administered 2014-02-23 – 2014-02-24 (×2): 15 [IU] via SUBCUTANEOUS
  Filled 2014-02-23 (×3): qty 0.15

## 2014-02-23 MED ORDER — DEXTROSE 50 % IV SOLN
INTRAVENOUS | Status: AC
  Administered 2014-02-23: 25 mL
  Filled 2014-02-23: qty 50

## 2014-02-23 MED ORDER — DEXTROSE-NACL 5-0.9 % IV SOLN
INTRAVENOUS | Status: DC
  Administered 2014-02-23 – 2014-02-24 (×2): via INTRAVENOUS

## 2014-02-23 MED ORDER — DEXTROSE 50 % IV SOLN: 25.0000 mL | Freq: Once | INTRAVENOUS | Status: DC

## 2014-02-23 NOTE — Evaluation (Signed)
Occupational Therapy Evaluation Patient Details Name: [name] MRN: [id number] DOB: 20-Feb-1966 Today's Date: 02/23/2014 Time:  -     OT Assessment / Plan / Recommendation History of present illness [name] is a 48 y.o. female with a history of previous infarcts who presents with sudden onset aphasia earlier today. She was brought to the ED in [place] where a CT was performed which showed(per report) a new stroke that was not apparent on a CT done 12 days earlier. She was therefore not a tpa candidate and was transferred to [facility] for further evaluation. Here, she was found to be aphasic and a CTA was obtained suggesting left parietal branch M2 occlusion and therefore she was felt to be a possible intervention candidate.    Clinical Impression   Pt admitted with above. She demonstrates the below listed deficits and will benefit from continued OT to maximize safety and independence with BADLs.  Pt is very lethargic.  She will awaken for brief periods and will follow one step commands, and participate in simple grooming activities, but falls to sleep mid activity. Moved into sitting position with mod A, but fell asleep sitting, and returned to supine. Recommend SNF, or home with 24 hour assist.      OT Assessment  Patient needs continued OT Services    Follow Up Recommendations       Barriers to Discharge      Equipment Recommendations       Recommendations for Other Services    Frequency       Precautions / Restrictions Precautions Precautions: Fall   Pertinent Vitals/Pain     ADL  Grooming: Brushing hair;Minimal assistance (mod cues to comb Lt. side) Where Assessed - Grooming: Supine, head of bed up Upper Body Bathing: +1 Total assistance Where Assessed - Upper Body Bathing: Supine, head of bed up Lower Body Bathing: +1 Total assistance Where Assessed - Lower Body Bathing: Supine, head of bed up;Rolling right and/or left Upper Body Dressing: +1 Total assistance Where  Assessed - Upper Body Dressing: Supine, head of bed up Lower Body Dressing: +1 Total assistance Where Assessed - Lower Body Dressing: Supine, head of bed up;Rolling right and/or left Toilet Transfer: +1 Total assistance (unable) Toileting - Clothing Manipulation and Hygiene: +1 Total assistance Where Assessed - Toileting Clothing Manipulation and Hygiene: Supine, head of bed flat;Rolling right and/or left    OT Diagnosis:    OT Problem List: Decreased strength;Decreased range of motion;Decreased activity tolerance;Impaired balance (sitting and/or standing);Impaired vision/perception;Decreased coordination;Decreased cognition;Decreased safety awareness;Impaired tone;Impaired UE functional use OT Treatment Interventions:     OT Goals(Current goals can be found in the care plan section)    Visit Information  Last OT Received On: 02/23/14 Assistance Needed: +1 (bed level acitivity- requires lift for transfer) History of Present Illness: [name] is a 48 y.o. female with a history of previous infarcts who presents with sudden onset aphasia earlier today. She was brought to the ED in [place] where a CT was performed which showed(per report) a new stroke that was not apparent on a CT done 12 days earlier. She was therefore not a tpa candidate and was transferred to [facility] for further evaluation. Here, she was found to be aphasic and a CTA was obtained suggesting left parietal branch M2 occlusion and therefore she was felt to be a possible intervention candidate.        Prior Functioning     Home Living Family/patient expects to be discharged to:: Unsure Additional Comments: pt  lives with family/friends who care for her 24/7 however pt now with speech deficits and further impaired balance.  No family present and pt unable to provide details of PLOF during OT eval Prior Function Level of Independence: Needs assistance Gait / Transfers Assistance Needed: pt non-amb due to bilat AKA. Pt does  have manual and electric w/c's. Pt dependent for all transfers. ADL's / Homemaking Assistance Needed: pt can do UB dressing but needs assist for LB dressing. pt able to feed self but dependent for meals to be made Communication Communication: Expressive difficulties Dominant Hand: Right         Vision/Perception Vision - Assessment Additional Comments: Pt unable to maintain arrousal for full participation    Cognition  Cognition Arousal/Alertness: Lethargic;Suspect due to medications Behavior During Therapy: Flat affect Overall Cognitive Status: Impaired/Different from baseline Current Attention Level: Focused;Sustained Following Commands: Follows one step commands inconsistently General Comments: Pt very lethargic.  She will arrouse briefly and will follow simple commands when arroused, but will fade off to sleep.     Extremity/Trunk Assessment Upper Extremity Assessment Upper Extremity Assessment: RUE deficits/detail;LUE deficits/detail RUE Deficits / Details: Grossly WFL - pt lethargic and with communication deficits making full assessment difficult LUE Deficits / Details: Pt with No active movement noted.  Moderate flexor spasticity noted throughout.  She demonstrates PROM:  shoulder flexion ~70*; elbow WFL; wrist to neutral extension;  Fingers with decreased composite extension  Cervical / Trunk Assessment Cervical / Trunk Assessment: Normal     Mobility Bed Mobility Overal bed mobility: Needs Assistance Bed Mobility: Supine to Sit Supine to sit: Mod assist General bed mobility comments: Pt moved supine to sitting in bed with HOB ~40*.  Pt attempts to grab onto therapist's neck to pull - required cues for safety      Exercise     Balance Balance Overall balance assessment: Needs assistance Sitting-balance support: Bilateral upper extremity supported Sitting balance-Leahy Scale: Poor Sitting balance - Comments: leans posteriorly.  Fell asleep seated   End of Session  OT - End of Session Activity Tolerance: Patient limited by lethargy Patient left: in bed;with call bell/phone within reach;with bed alarm set  [place], Ellard Artis M 02/23/2014, 4:32 PM

## 2014-02-23 NOTE — Progress Notes (Signed)
SLP Cancellation Note  Patient Details Name: Maricela Alling MRN: [id number] DOB: 06/17/1966   Cancelled treatment:       Reason Eval/Treat Not Completed: Fatigue/lethargy limiting ability to participate (oral suction completed by slp as pt drooling, copious white secretions removed, pt did not awaken adequately for po or slp tx with verbal and tactile stimulation.  will reattempt another time. )   Claudie Fisherman, [facility] SLP [phone]

## 2014-02-23 NOTE — Progress Notes (Signed)
OT Cancellation Note  Patient Details Name: [name] MRN: [id number] DOB: 05-Jun-1966   Cancelled Treatment:    Reason Eval/Treat Not Completed: Medical issues which prohibited therapy - Decreased Blood suger.  Lucille Passy, OTR/L [id number]  02/23/2014, 12:45 PM

## 2014-02-23 NOTE — Plan of Care (Signed)
Unable to do full assessment this AM. Pt was very lethargic and drowsy. Opened eyes a couple of times when nurse called out pts name. Burnetta Sabin notified.

## 2014-02-23 NOTE — Progress Notes (Signed)
PT Cancellation Note  Patient Details Name: [name] MRN: [id number] DOB: 1966-10-15   Cancelled Treatment:    Reason Eval/Treat Not Completed: Fatigue/lethargy limiting ability to participate. Patient with increase lethargy this morning. Per RN patient received Ativan last night.    Jacqualyn Posey 02/23/2014, 11:09 AM

## 2014-02-23 NOTE — Progress Notes (Signed)
Stroke Team Progress Note  HISTORY [name] is a 48 y.o. female with a history of previous infarcts who presents with sudden onset aphasia earlier today 02/20/2014 at 1400. She was brought to the ED in [place] where a CT was performed which showed(per report) a new stroke that was not apparent on a CT done 12 days earlier. She was therefore not a tpa candidate and was transferred to [facility] 02/20/2014 1708 for further evaluation. Here, she was found to be aphasic and a CTA was obtained suggesting left parietal branch M2 occlusion and therefore she was felt to be a possible intervention candidate.  She has a history of multiple strokes with left sided weakness. Patient was not administerd TPA . She was admitted for further evaluation and treatment.  SUBJECTIVE RN called this am. Reported she was not talking, not eating. glucose 75.  OBJECTIVE Most recent Vital Signs: Filed Vitals:   02/22/14 2122 02/23/14 0119 02/23/14 0512 02/23/14 0959  BP: 98/67 113/68 111/71 128/74  Pulse: 92 88 85 73  Temp: 98.5 F (36.9 C) 97.8 F (36.6 C) 97.3 F (36.3 C) 98 F (36.7 C)  TempSrc: Oral Oral Oral Oral  Resp: 20 20 20 20   Height:      Weight:      SpO2: 95% 96% 96% 93%   CBG (last 3)   Recent Labs  02/22/14 2344 02/23/14 0347 02/23/14 0737  GLUCAP 135* 85 74    IV Fluid Intake:   . sodium chloride 75 mL/hr at 02/22/14 2147    MEDICATIONS  . aspirin EC  81 mg Oral Daily  . atorvastatin  20 mg Oral q1800  . carvedilol  3.125 mg Oral BID WC  . clopidogrel  75 mg Oral Q breakfast  . digoxin  0.125 mg Oral Q M,W,F  . DULoxetine  60 mg Oral BID  . enoxaparin (LOVENOX) injection  40 mg Subcutaneous Q24H  . fentaNYL  50 mcg Transdermal Q72H  . influenza vac split quadrivalent PF  0.5 mL Intramuscular Tomorrow-1000  . insulin aspart  0-9 Units Subcutaneous TID WC  . insulin detemir  30 Units Subcutaneous QHS  . magnesium oxide  400 mg Oral Daily  . midodrine  5 mg Oral TID WC  .  nicotine  21 mg Transdermal Q24H  . pantoprazole (PROTONIX) IV  40 mg Intravenous QHS  . pneumococcal 23 valent vaccine  0.5 mL Intramuscular Tomorrow-1000  . pregabalin  50 mg Oral TID  . ranolazine  500 mg Oral BID   PRN:    Diet:  Dysphagia 2 thin liquids Activity:  OOB with assistance DVT Prophylaxis:  Lovenox 40 mg sq daily   CLINICALLY SIGNIFICANT STUDIES Basic Metabolic Panel:   Recent Labs Lab 02/20/14 1725 02/20/14 1748  NA 139 140  K 3.9 3.7  CL 100 102  CO2 23  --   GLUCOSE 252* 261*  BUN 9 7  CREATININE 0.28* 0.30*  CALCIUM 9.5  --    Liver Function Tests:   Recent Labs Lab 02/20/14 1725  AST 11  ALT 14  ALKPHOS 122*  BILITOT 0.2*  PROT 7.6  ALBUMIN 3.5   CBC:   Recent Labs Lab 02/20/14 1725 02/20/14 1748 02/23/14 0923  WBC 12.2*  --  11.1*  NEUTROABS 9.4*  --   --   HGB 13.2 14.3 12.5  HCT 39.2 42.0 37.3  MCV 83.6  --  83.1  PLT 319  --  331   Coagulation:   Recent Labs  Lab 02/20/14 1725  LABPROT 12.5  INR 0.95   Cardiac Enzymes: No results found for this basename: CKTOTAL, CKMB, CKMBINDEX, TROPONINI,  in the last 168 hours Urinalysis:   Recent Labs Lab 02/20/14 2320  COLORURINE YELLOW  LABSPEC 1.034*  PHURINE 7.5  GLUCOSEU 500*  HGBUR NEGATIVE  BILIRUBINUR NEGATIVE  KETONESUR 15*  PROTEINUR NEGATIVE  UROBILINOGEN 0.2  NITRITE NEGATIVE  LEUKOCYTESUR NEGATIVE   Lipid Panel    Component Value Date/Time   CHOL 159 02/21/2014 0255   TRIG 117 02/21/2014 0255   HDL 44 02/21/2014 0255   CHOLHDL 3.6 02/21/2014 0255   VLDL 23 02/21/2014 0255   LDLCALC 92 02/21/2014 0255   HgbA1C  Lab Results  Component Value Date   HGBA1C 8.2* 02/21/2014    Urine Drug Screen:     Component Value Date/Time   LABOPIA NONE DETECTED 02/20/2014 2319   COCAINSCRNUR NONE DETECTED 02/20/2014 2319   LABBENZ POSITIVE* 02/20/2014 2319   AMPHETMU NONE DETECTED 02/20/2014 2319   THCU NONE DETECTED 02/20/2014 2319   LABBARB NONE DETECTED 02/20/2014 2319     Alcohol Level:   Recent Labs Lab 02/20/14 1725  ETH <11    CT angio head and neck  02/20/2014    Acute left MCA infarct with progressive edema in the left insula. CTA suboptimal but does reveal thrombus in the left M2 segment.  Large right chronic MCA infarct with possible extension over the convexity.  Severe intracranial atherosclerotic disease with diffuse vessel calcification and atherosclerotic irregularity.  Atherosclerotic calcification in the carotid bifurcation bilaterally. There appears to be 75 % stenosis on the right and mild stenosis on the left although quantification of stenosis is not possible due to motion and suboptimal arterial enhancement.   Cerebral Angiogram 02/20/2014 L CFA approach. 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 Procedure stopped.  MRI of the brain  The present examination is limited to diffusion and ADC imaging. Patient was not able to complete further imaging despite medication. Moderate to slightly large size acute infarct centered at the left peri operculum region involving the left subinsular region bordering the left lenticular nucleus and extending into the left corona radiata. Limited for evaluating for associated hemorrhage.   MRA of the brain  Could not tolerate  2D Echocardiogram  Technically difficult study with poor acoustic windows. Normal LV size with low normal to mildly reduced systolic function, EF 99991111. In some views, there appeared to be septal and possibly apical hypokinesis but difficult images. Normal RV size and systolic function. TEE would help with assess LV function and wall motion.   Carotid Doppler  No evidence of hemodynamically significant internal carotid artery stenosis. Vertebral artery flow is antegrade.   CXR  02/21/2014   Borderline cardiomegaly, no acute pulmonary  process.    EKG  sinus tachycardia. For complete results please see formal report.   Therapy Recommendations   Physical Exam   Frail middle aged lady not in distress. Has bilateral above knee amputations.Awake alert. Afebrile. Head is nontraumatic. Neck is supple without bruit. Hearing is normal. Cardiac exam no murmur or gallop. Lungs are clear to auscultation. Distal pulses are not felt in lower extremities.. Neurological Exam : Awake alert. Expressive greater than receptive aphasia. She speaks short sentences only. Follows simple midline and one-step commands only. Left gaze preference but can look to the right past midline. Blinks to  threat on the right but not somnolent. Left lower facial weakness. Tongue midline. Fundi not visualized. Left approximating 3/5 weakness with spasticity and increased tone. Mild left hip flexor weakness. Normal strength on the right and able to move right upper extremity and right thigh against   gravity. Plantars cannot be checked. Sensation is intact. Gait cannot be tested.  ASSESSMENT Ms. [name] is a 48 y.o. female presenting with sudden onset expressive aphasia. Imaging confirms an old right MCA infarct and a new left MCA branch insular infarct in this vasculopath. 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 MRI does confirm new left brain moderate size MCA thronmbotic infarct. On aspirin 81 mg orally every day and clopidogrel 75 mg orally every day prior to admission. Now on aspirin 81 mg orally every day and clopidogrel 75 mg orally every day for secondary stroke prevention. Patient with resultant global aphasia. Work up completed.  This am with increased lethargy. Will waken to stimulation, decreased verbal output. CBG 75 early am. Now 73. CBC with WBC 12.2->11.1., BMET pending. UA pending. Will add CXR. Patient did get ativan last night for MRI - likely ativan related lethargy in setting of  pt with bilateral strokes.  Hypertension Diabetes, HgbA1c 8.2, goal < 7.0  LDL 92  COPD  Obesity, Body mass index is 34.86 kg/(m^2).   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   OOB, therapy evals  Likely SNF placement vs home with family. Social worker for SNF placement  Follow up labs, UA, CXR, monitor glucose. Add d5 should glucose drop  Burnetta Sabin, MSN, RN, ANVP-BC, ANP-BC, [facility] Pager: [phone] 02/23/2014 10:28 AM  I have personally obtained a history, examined the patient, evaluated imaging results, and formulated the assessment and plan of care. I agree with the above. Antony Contras, MD

## 2014-02-23 NOTE — Progress Notes (Signed)
NP Ivin Booty notified about patient's low CBG. Orders given and carried out. Patient will be monitored closely.

## 2014-02-23 NOTE — Progress Notes (Signed)
Hypoglycemic Event  CBG: 68  Treatment: D50 IV 25 mL  Symptoms: Sweaty  Follow-up CBG: Time:1309 CBG Result:173  Possible Reasons for Event: Inadequate meal intake  Comments/MD notified:yes    [name], [name]  Remember to initiate Hypoglycemia Order Set & complete

## 2014-02-24 LAB — GLUCOSE, CAPILLARY
GLUCOSE-CAPILLARY: 117 mg/dL — AB (ref 70–99)
Glucose-Capillary: 135 mg/dL — ABNORMAL HIGH (ref 70–99)
Glucose-Capillary: 136 mg/dL — ABNORMAL HIGH (ref 70–99)
Glucose-Capillary: 130 mg/dL — ABNORMAL HIGH (ref 70–99)
Glucose-Capillary: 118 mg/dL — ABNORMAL HIGH (ref 70–99)
Glucose-Capillary: 85 mg/dL (ref 70–99)
Glucose-Capillary: 231 mg/dL — ABNORMAL HIGH (ref 70–99)

## 2014-02-24 NOTE — Progress Notes (Signed)
Speech Language Pathology Treatment: Dysphagia;Cognitive-Linquistic  Patient Details Name: Mei Ruthenberg MRN: [id number] DOB: January 26, 1966 Today's Date: 02/24/2014 Time: [id number] SLP Time Calculation (min): 20 min  Assessment / Plan / Recommendation Clinical Impression  Treatment focused on dysphagia/cognition/communication.  Alertness waxed and waned with increased lethargy mid session.  Demonstrated poor ability to manipulate and masticate Dys 2 diet texture with oral holding and lingual residue requiring suctioning.  Several coughs (immediate/delayed) present indicative of reduced airway protection and SLP ceased food/liquid as lethargy increased.  She required max verbal cues to sustain attention to speaker, initiate verbal responses.  SLP downgraded diet to Dys 2 texture.  SLP will continue to see while at hospital and recommend follow up ST at [facility].   HPI HPI: 48 year old female admitted 02/20/14 due to sudden onset of aphasia.  PMH significant for multiple CVAs, MI, HTN, COPD, GERD, DM, B AKA.  MRI showed Moderate to slightly large size acute infarct centered at the left peri operculum region.   Pertinent Vitals WDL  SLP Plan  Continue with current plan of care    Recommendations Diet recommendations: Dysphagia 1 (puree);Thin liquid Liquids provided via: Cup;No straw Medication Administration: Crushed with puree Supervision: Patient able to self feed;Full supervision/cueing for compensatory strategies Compensations: Slow rate;Small sips/bites;Check for pocketing Postural Changes and/or Swallow Maneuvers: Seated upright 90 degrees;Upright 30-60 min after meal              Oral Care Recommendations: Oral care BID Follow up Recommendations: 24 hour supervision/assistance Plan: Continue with current plan of care    GO     Orbie Pyo Adrienne Trombetta M.Ed [employer] [phone]  02/24/2014

## 2014-02-24 NOTE — Progress Notes (Signed)
Stroke Team Progress Note  HISTORY [name] is a 48 y.o. female with a history of previous infarcts who presents with sudden onset aphasia earlier today 02/20/2014 at 1400. She was brought to the ED in [place] where a CT was performed which showed(per report) a new stroke that was not apparent on a CT done 12 days earlier. She was therefore not a tpa candidate and was transferred to [facility] 02/20/2014 1708 for further evaluation. Here, she was found to be aphasic and a CTA was obtained suggesting left parietal branch M2 occlusion and therefore she was felt to be a possible intervention candidate.  She has a history of multiple strokes with left sided weakness. Patient was not administerd TPA . She was admitted for further evaluation and treatment.  SUBJECTIVE RN reports patient much more alert and talking this am. Ate breakfast.  OBJECTIVE Most recent Vital Signs: Filed Vitals:   02/23/14 1758 02/23/14 2107 02/24/14 0140 02/24/14 0607  BP: 115/74 124/65 130/63 113/60  Pulse: 74 70 76 80  Temp: 97.5 F (36.4 C) 97.9 F (36.6 C) 97.8 F (36.6 C) 98.2 F (36.8 C)  TempSrc: Oral Oral Oral Oral  Resp: 20 20 20 20   Height:      Weight:      SpO2: 93% 94% 94% 93%   CBG (last 3)   Recent Labs  02/24/14 0228 02/24/14 0445 02/24/14 0843  GLUCAP 135* 130* 117*    IV Fluid Intake:   . sodium chloride 75 mL/hr at 02/22/14 2147  . dextrose 5 % and 0.9% NaCl 50 mL/hr at 02/24/14 0751    MEDICATIONS  . aspirin EC  81 mg Oral Daily  . atorvastatin  20 mg Oral q1800  . carvedilol  3.125 mg Oral BID WC  . clopidogrel  75 mg Oral Q breakfast  . dextrose  25 mL Intravenous Once  . digoxin  0.125 mg Oral Q M,W,F  . DULoxetine  60 mg Oral BID  . enoxaparin (LOVENOX) injection  40 mg Subcutaneous Q24H  . fentaNYL  50 mcg Transdermal Q72H  . influenza vac split quadrivalent PF  0.5 mL Intramuscular Tomorrow-1000  . insulin aspart  0-9 Units Subcutaneous TID WC  . insulin detemir  15 Units  Subcutaneous QHS  . magnesium oxide  400 mg Oral Daily  . midodrine  5 mg Oral TID WC  . nicotine  21 mg Transdermal Q24H  . pantoprazole (PROTONIX) IV  40 mg Intravenous QHS  . pneumococcal 23 valent vaccine  0.5 mL Intramuscular Tomorrow-1000  . pregabalin  50 mg Oral TID  . ranolazine  500 mg Oral BID   PRN:    Diet:  Dysphagia 2 thin liquids Activity:  OOB with assistance DVT Prophylaxis:  Lovenox 40 mg sq daily   CLINICALLY SIGNIFICANT STUDIES Basic Metabolic Panel:   Recent Labs Lab 02/20/14 1725 02/20/14 1748 02/23/14 0923  NA 139 140 139  K 3.9 3.7 3.4*  CL 100 102 103  CO2 23  --  18*  GLUCOSE 252* 261* 71  BUN 9 7 9   CREATININE 0.28* 0.30* 0.31*  CALCIUM 9.5  --  8.9   Liver Function Tests:   Recent Labs Lab 02/20/14 1725  AST 11  ALT 14  ALKPHOS 122*  BILITOT 0.2*  PROT 7.6  ALBUMIN 3.5   CBC:   Recent Labs Lab 02/20/14 1725 02/20/14 1748 02/23/14 0923  WBC 12.2*  --  11.1*  NEUTROABS 9.4*  --   --  HGB 13.2 14.3 12.5  HCT 39.2 42.0 37.3  MCV 83.6  --  83.1  PLT 319  --  331   Coagulation:   Recent Labs Lab 02/20/14 1725  LABPROT 12.5  INR 0.95   Cardiac Enzymes: No results found for this basename: CKTOTAL, CKMB, CKMBINDEX, TROPONINI,  in the last 168 hours Urinalysis:   Recent Labs Lab 02/20/14 2320 02/23/14 1542  COLORURINE YELLOW YELLOW  LABSPEC 1.034* 1.023  PHURINE 7.5 5.5  GLUCOSEU 500* 500*  HGBUR NEGATIVE SMALL*  BILIRUBINUR NEGATIVE SMALL*  KETONESUR 15* 40*  PROTEINUR NEGATIVE NEGATIVE  UROBILINOGEN 0.2 0.2  NITRITE NEGATIVE NEGATIVE  LEUKOCYTESUR NEGATIVE NEGATIVE   Lipid Panel    Component Value Date/Time   CHOL 159 02/21/2014 0255   TRIG 117 02/21/2014 0255   HDL 44 02/21/2014 0255   CHOLHDL 3.6 02/21/2014 0255   VLDL 23 02/21/2014 0255   LDLCALC 92 02/21/2014 0255   HgbA1C  Lab Results  Component Value Date   HGBA1C 8.2* 02/21/2014    Urine Drug Screen:     Component Value Date/Time   LABOPIA NONE  DETECTED 02/20/2014 2319   COCAINSCRNUR NONE DETECTED 02/20/2014 2319   LABBENZ POSITIVE* 02/20/2014 2319   AMPHETMU NONE DETECTED 02/20/2014 2319   THCU NONE DETECTED 02/20/2014 2319   LABBARB NONE DETECTED 02/20/2014 2319    Alcohol Level:   Recent Labs Lab 02/20/14 1725  ETH <11    CT angio head and neck  02/20/2014    Acute left MCA infarct with progressive edema in the left insula. CTA suboptimal but does reveal thrombus in the left M2 segment.  Large right chronic MCA infarct with possible extension over the convexity.  Severe intracranial atherosclerotic disease with diffuse vessel calcification and atherosclerotic irregularity.  Atherosclerotic calcification in the carotid bifurcation bilaterally. There appears to be 75 % stenosis on the right and mild stenosis on the left although quantification of stenosis is not possible due to motion and suboptimal arterial enhancement.   Cerebral Angiogram 02/20/2014 L CFA approach. 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 Procedure stopped.  MRI of the brain  The present examination is limited to diffusion and ADC imaging. Patient was not able to complete further imaging despite medication. Moderate to slightly large size acute infarct centered at the left peri operculum region involving the left subinsular region bordering the left lenticular nucleus and extending into the left corona radiata. Limited for evaluating for associated hemorrhage.   MRA of the brain  Could not tolerate  2D Echocardiogram  Technically difficult study with poor acoustic windows. Normal LV size with low normal to mildly reduced systolic function, EF [id number]. In some views, there appeared to be septal and possibly apical hypokinesis but difficult images. Normal RV size and systolic function. TEE would help with assess LV  function and wall motion.   Carotid Doppler  No evidence of hemodynamically significant internal carotid artery stenosis. Vertebral artery flow is antegrade.   CXR  02/21/2014   Borderline cardiomegaly, no acute pulmonary process.    EKG  sinus tachycardia. For complete results please see formal report.   Therapy Recommendations   Physical Exam   Frail middle aged lady not in distress. Has bilateral above knee amputations.Awake alert. Afebrile. Head is nontraumatic. Neck is supple without bruit. Hearing is normal. Cardiac exam no murmur or gallop. Lungs are clear to auscultation.  Distal pulses are not felt in lower extremities.. Neurological Exam : Awake alert. Expressive greater than receptive aphasia. She speaks short sentences only. Follows simple midline and one-step commands only. Left gaze preference but can look to the right past midline. Blinks to threat on the right but not somnolent. Left lower facial weakness. Tongue midline. Fundi not visualized. Left approximating 3/5 weakness with spasticity and increased tone. Mild left hip flexor weakness. Normal strength on the right and able to move right upper extremity and right thigh against   gravity. Plantars cannot be checked. Sensation is intact. Gait cannot be tested.  ASSESSMENT Ms. [name] is a 48 y.o. female presenting with sudden onset expressive aphasia. Imaging confirms an old right MCA infarct and a new left MCA branch insular infarct in this vasculopath. 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 MRI does confirm new left brain moderate size MCA thronmbotic infarct. On aspirin 81 mg orally every day and clopidogrel 75 mg orally every day prior to admission. Now on aspirin 81 mg orally every day and clopidogrel 75 mg orally every day for secondary stroke prevention. Patient with resultant global aphasia. Work up completed.  Lethargy much improved this am. Testing  unrevealing for an infectious source. Lethargy ativan related in setting of pt with bilateral strokes.  Hypertension Diabetes, HgbA1c 8.2, goal < 7.0  LDL 92  COPD  Obesity, Body mass index is 34.86 kg/(m^2).   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   D/c IVF  D/c foley  D/c tele  Therapy evals for disposition - SNF placement vs home with family (she lived with ex-boyfriend prior to admission and he provided care). Social worker for SNF placement in the event it is needed  Medically ready for discharge once disposition determined.  Burnetta Sabin, MSN, RN, ANVP-BC, ANP-BC, [facility] Pager: [phone] 02/24/2014 10:40 AM  I have personally obtained a history, examined the patient, evaluated imaging results, and formulated the assessment and plan of care. I agree with the above.  Antony Contras, MD

## 2014-02-24 NOTE — Social Work (Signed)
Clinical Social Work Department CLINICAL SOCIAL WORK PLACEMENT NOTE 02/24/2014  Patient:  [name], [name]  Account Number:  [id number] Gate date:  02/20/2014  Clinical Social Worker:  Daiva Huge  Date/time:  02/24/2014 03:26 PM  Clinical Social Work is seeking post-discharge placement for this patient at the following level of care:   [place]   (*CSW will update this form in Epic as items are completed)   02/24/2014  Patient/family provided with [place] Department of Clinical Social Work's list of facilities offering this level of care within the geographic area requested by the patient (or if unable, by the patient's family).  02/24/2014  Patient/family informed of their freedom to choose among providers that offer the needed level of care, that participate in Medicare, Medicaid or managed care program needed by the patient, have an available bed and are willing to accept the patient.  02/24/2014  Patient/family informed of [facility]' ownership interest in [facility], as well as of the fact that they are under no obligation to receive care at this facility.  PASARR submitted to EDS on  PASARR number received from EDS on 02/24/2014  FL2 transmitted to all facilities in geographic area requested by pt/family on  02/24/2014 FL2 transmitted to all facilities within larger geographic area on   Patient informed that his/her managed care company has contracts with or will negotiate with  certain facilities, including the following:     Patient/family informed of bed offers received:   Patient chooses bed at  Physician recommends and patient chooses bed at    Patient to be transferred to  on   Patient to be transferred to facility by   The following physician request were entered in Epic:   Additional Comments: Eduard Clos, MSW, [place]

## 2014-02-24 NOTE — Care Management Note (Signed)
Page 1 of 2   03/03/2014     2:29:34 PM   CARE MANAGEMENT NOTE 03/03/2014  Patient:  [name], [name]   Account Number:  [id number]  Date Initiated:  02/22/2014  Documentation initiated by:  Marvetta Gibbons  Subjective/Objective Assessment:   Pt admitted with new stroke     Action/Plan:   PTA pt lived at home - PT eval reccommending SNF- CSW consulted   Anticipated DC Date:  02/24/2014   Anticipated DC Plan:  REST HOME  In-house referral  Clinical Social Worker      DC Planning Services  CM consult      Choice offered to / List presented to:  C-4 Adult Children   DME arranged  OTHER - SEE COMMENT        HH arranged  HH-4 NURSE'S AIDE  HH-1 RN  HH-2 PT  HH-3 OT  [place]      [place] agency  [place]   Status of service:  In process, will continue to follow Medicare Important Message given?   (If response is "NO", the following Medicare IM given date fields will be blank) Date Medicare IM given:   Date Additional Medicare IM given:    Discharge Disposition:  [place]  Per UR Regulation:  Reviewed for med. necessity/level of care/duration of stay  If discussed at [place] of Stay Meetings, dates discussed:    Comments:  03/03/14 [place], MSN,CM- Orders recieved and faxed to the DME company/HH agency   03/03/14 [place], MSN, CM- Spoke with Hoyle Sauer at [facility] regarding anticipated home health orders.  Requested information was faxed [phone].  Updated address, phone number and PCP was included.  Official orders will be faxed when available. Carolyn aware.  Also spoke with Sharyn Lull at [facility] [phone] regarding anticipated hoyer lift order. When available, DME orders will be faxed. Demographics sheet was updated to include new address, phone number, Ht/Wt.  Patient's daughter Mickel Baas was updated.   03/03/14 0945 Lorne Skeens RN, MSN, CM-  CM spoke with Burnetta Sabin, NP, who will write home health orders.   03/02/14 [place], MSN, [place] with patient's daughter Mickel Baas [phone], who states that since SNF search has been unsuccessful, she plans to discontinue search and take patient home with home health. Daughter is declining transportation and plans to transport patient via personal vehicle.  Dr Leonie Man was notified of family's plan to leave tonight and need for [facility] orders.  Per Dr Leonie Man, [facility] orders are to be obtained through Burnetta Sabin NP, who is no longer available this evening.  Text page was sent to Dr Leonie Man informing him that [facility] orders will be obtained in the morniing. Family is agreeable to discharging home tonight and following up with home health in the morning.  Patient will be discharging to [place], [place] [address] in the care of her mother and daughter Mickel Baas.  Pt has previously been with [facility] home health.     02/24/14 [place], MSN, CM-Met with patient after recieving message from Dr Leonie Man that patient will be discharging home with home health.  Patient appears cognitively intact, but has speech deficits.  Patient indicates that she has 24hr care at home and that she plans to be discharged back into the same care.  PT/OT are recommending SNF at this time.  Due to patient's speech difficulty,  CM asked permission to speak with patient's daughter Crystal to discuss discharge planning.  Per Crystal, patient DOES have 24 hr care, but patient will need to have some rehab prior to going home.  Home is the ultimate goal for both patient and family.  Daughter states that prior to admission, patient had been discussing rehab at [facility] in [place].  CM explained that in order to transfer patient to a SNF, patient would have to agree to SNF placement.  CM then spoke with patient again and relayed the conversation that took place with Crystal. Patient indicates that she is  willing to do short-term rehab. Family is interested in [facility] in [place].  CM notified CSW and text page was sent to S. Miachel Roux, NP regarding discharge disposition. Patient's RN updated.

## 2014-02-24 NOTE — Progress Notes (Signed)
Physical Therapy Treatment Patient Details Name: [name] MRN: [id number] DOB: 07/26/1966 Today's Date: 02/24/2014 Time: [id number] PT Time Calculation (min): 19 min  PT Assessment / Plan / Recommendation  History of Present Illness [name] is a 48 y.o. female with a history of previous infarcts who presents with sudden onset aphasia earlier today. She was brought to the ED in [place] where a CT was performed which showed(per report) a new stroke that was not apparent on a CT done 12 days earlier. She was therefore not a tpa candidate and was transferred to [facility] for further evaluation. Here, she was found to be aphasic and a CTA was obtained suggesting left parietal branch M2 occlusion and therefore she was felt to be a possible intervention candidate.    PT Comments   Pt unable to give clear answers as to how pt performs mobility and ADLs at home and at times just stares at PT without answering questions.  At this time feel it is safest for pt to D/C to SNF.  Will continue to follow.    Follow Up Recommendations  SNF     Does the patient have the potential to tolerate intense rehabilitation     Barriers to Discharge        Equipment Recommendations  None recommended by PT    Recommendations for Other Services    Frequency Min 4X/week   Progress towards PT Goals Progress towards PT goals: Progressing toward goals  Plan Current plan remains appropriate    Precautions / Restrictions Precautions Precautions: Fall Restrictions Weight Bearing Restrictions: No   Pertinent Vitals/Pain Denied pain.      Mobility  Bed Mobility Overal bed mobility: Needs Assistance Bed Mobility: Rolling Rolling: Min assist;Max assist General bed mobility comments: When rolling to L pt requires MaxA, however to R pt uses R UE on bed rail to A.  pt required rolling x3 for changing of wet linens and repositioning.   Modified Rankin (Stroke Patients Only) Pre-Morbid Rankin Score: Severe  disability Modified Rankin: Severe disability    Exercises     PT Diagnosis:    PT Problem List:   PT Treatment Interventions:     PT Goals (current goals can now be found in the care plan section) Acute Rehab PT Goals Time For Goal Achievement: 03/07/14 Potential to Achieve Goals: Fair  Visit Information  Last PT Received On: 02/24/14 Assistance Needed: +1 (bed level acitivity- requires lift for transfer) History of Present Illness: [name] is a 48 y.o. female with a history of previous infarcts who presents with sudden onset aphasia earlier today. She was brought to the ED in [place] where a CT was performed which showed(per report) a new stroke that was not apparent on a CT done 12 days earlier. She was therefore not a tpa candidate and was transferred to [facility] for further evaluation. Here, she was found to be aphasic and a CTA was obtained suggesting left parietal branch M2 occlusion and therefore she was felt to be a possible intervention candidate.     Subjective Data  Subjective: Minimal verbalizations.     Cognition  Cognition Arousal/Alertness: Awake/alert Behavior During Therapy: Flat affect Overall Cognitive Status: Impaired/Different from baseline Area of Impairment: Attention;Following commands;Safety/judgement;Problem solving;Awareness Current Attention Level: Sustained Following Commands: Follows one step commands inconsistently Safety/Judgement: Decreased awareness of safety Awareness: Emergent;Anticipatory Problem Solving: Slow processing;Difficulty sequencing;Requires verbal cues;Requires tactile cues;Decreased initiation General Comments: pt alert today, however with very minimal verbalizations.  pt will answer one question  verbally, but then simply stare at PT when asked a second question.      Balance     End of Session PT - End of Session Activity Tolerance: Patient tolerated treatment well Patient left: in bed;with call bell/phone within reach;with  bed alarm set Nurse Communication: Mobility status   GP     Catarina Hartshorn, [place] 02/24/2014, 1:13 PM

## 2014-02-24 NOTE — Progress Notes (Signed)
UR complete.  Acie Custis RN, MSN 

## 2014-02-25 ENCOUNTER — Encounter (HOSPITAL_COMMUNITY): Payer: Self-pay | Admitting: Nurse Practitioner

## 2014-02-25 DIAGNOSIS — E119 Type 2 diabetes mellitus without complications: Secondary | ICD-10-CM | POA: Diagnosis present

## 2014-02-25 DIAGNOSIS — F17210 Nicotine dependence, cigarettes, uncomplicated: Secondary | ICD-10-CM | POA: Diagnosis present

## 2014-02-25 DIAGNOSIS — I10 Essential (primary) hypertension: Secondary | ICD-10-CM | POA: Diagnosis present

## 2014-02-25 DIAGNOSIS — E669 Obesity, unspecified: Secondary | ICD-10-CM | POA: Diagnosis present

## 2014-02-25 DIAGNOSIS — Z8673 Personal history of transient ischemic attack (TIA), and cerebral infarction without residual deficits: Secondary | ICD-10-CM

## 2014-02-25 DIAGNOSIS — E1169 Type 2 diabetes mellitus with other specified complication: Secondary | ICD-10-CM | POA: Diagnosis present

## 2014-02-25 HISTORY — DX: Obesity, unspecified: E66.9

## 2014-02-25 HISTORY — DX: Nicotine dependence, cigarettes, uncomplicated: F17.210

## 2014-02-25 HISTORY — DX: Personal history of transient ischemic attack (TIA), and cerebral infarction without residual deficits: Z86.73

## 2014-02-25 LAB — GLUCOSE, CAPILLARY
GLUCOSE-CAPILLARY: 132 mg/dL — AB (ref 70–99)
Glucose-Capillary: 134 mg/dL — ABNORMAL HIGH (ref 70–99)
Glucose-Capillary: 205 mg/dL — ABNORMAL HIGH (ref 70–99)
Glucose-Capillary: 183 mg/dL — ABNORMAL HIGH (ref 70–99)
Glucose-Capillary: 218 mg/dL — ABNORMAL HIGH (ref 70–99)

## 2014-02-25 MED ORDER — INSULIN DETEMIR 100 UNIT/ML ~~LOC~~ SOLN
30.0000 [IU] | Freq: Every day | SUBCUTANEOUS | Status: DC
  Administered 2014-02-25 – 2014-03-01 (×5): 30 [IU] via SUBCUTANEOUS
  Filled 2014-02-25 (×6): qty 0.3

## 2014-02-25 NOTE — Social Work (Signed)
SNF bed confirmed for patient at family's preferred setting- [facility] in [place]- will advise MD and plan transfer once MD orders d/c. FL2 on chart for MD review and signature. Eduard Clos, MSW, [place]

## 2014-02-25 NOTE — Progress Notes (Signed)
Stroke Team Progress Note  HISTORY [name] is a 48 y.o. female with a history of previous infarcts who presents with sudden onset aphasia earlier today 02/20/2014 at 1400. She was brought to the ED in [place] where a CT was performed which showed(per report) a new stroke that was not apparent on a CT done 12 days earlier. She was therefore not a tpa candidate and was transferred to [facility] 02/20/2014 1708 for further evaluation. Here, she was found to be aphasic and a CTA was obtained suggesting left parietal branch M2 occlusion and therefore she was felt to be a possible intervention candidate.  She has a history of multiple strokes with left sided weakness. Patient was not administerd TPA . She was admitted for further evaluation and treatment.  SUBJECTIVE No complaints. Medical stable for discharge to skilled nursing facility when bed available.  OBJECTIVE Most recent Vital Signs: Filed Vitals:   02/24/14 2217 02/25/14 0221 02/25/14 0558 02/25/14 0818  BP: 106/54 159/70 104/69 109/54  Pulse: 72 92 104   Temp: 98.4 F (36.9 C) 99.2 F (37.3 C) 98.6 F (37 C)   TempSrc: Oral Oral Oral   Resp: 20 20 18    Height:      Weight:      SpO2: 95% 93% 94%    CBG (last 3)   Recent Labs  02/24/14 1955 02/25/14 0033 02/25/14 0436  GLUCAP 231* 205* 134*    IV Fluid Intake:   . dextrose 5 % and 0.9% NaCl 50 mL/hr at 02/24/14 0751    MEDICATIONS  . aspirin EC  81 mg Oral Daily  . atorvastatin  20 mg Oral q1800  . carvedilol  3.125 mg Oral BID WC  . clopidogrel  75 mg Oral Q breakfast  . dextrose  25 mL Intravenous Once  . digoxin  0.125 mg Oral Q M,W,F  . DULoxetine  60 mg Oral BID  . enoxaparin (LOVENOX) injection  40 mg Subcutaneous Q24H  . fentaNYL  50 mcg Transdermal Q72H  . influenza vac split quadrivalent PF  0.5 mL Intramuscular Tomorrow-1000  . insulin aspart  0-9 Units Subcutaneous TID WC  . insulin detemir  15 Units Subcutaneous QHS  . magnesium oxide  400 mg Oral  Daily  . midodrine  5 mg Oral TID WC  . nicotine  21 mg Transdermal Q24H  . pantoprazole (PROTONIX) IV  40 mg Intravenous QHS  . pneumococcal 23 valent vaccine  0.5 mL Intramuscular Tomorrow-1000  . pregabalin  50 mg Oral TID  . ranolazine  500 mg Oral BID   PRN:    Diet:  Dysphagia 2 thin liquids Activity:  OOB with assistance DVT Prophylaxis:  Lovenox 40 mg sq daily   CLINICALLY SIGNIFICANT STUDIES Basic Metabolic Panel:   Recent Labs Lab 02/20/14 1725 02/20/14 1748 02/23/14 0923  NA 139 140 139  K 3.9 3.7 3.4*  CL 100 102 103  CO2 23  --  18*  GLUCOSE 252* 261* 71  BUN 9 7 9   CREATININE 0.28* 0.30* 0.31*  CALCIUM 9.5  --  8.9   Liver Function Tests:   Recent Labs Lab 02/20/14 1725  AST 11  ALT 14  ALKPHOS 122*  BILITOT 0.2*  PROT 7.6  ALBUMIN 3.5   CBC:   Recent Labs Lab 02/20/14 1725 02/20/14 1748 02/23/14 0923  WBC 12.2*  --  11.1*  NEUTROABS 9.4*  --   --   HGB 13.2 14.3 12.5  HCT 39.2 42.0 37.3  MCV  83.6  --  83.1  PLT 319  --  331   Coagulation:   Recent Labs Lab 02/20/14 1725  LABPROT 12.5  INR 0.95   Cardiac Enzymes: No results found for this basename: CKTOTAL, CKMB, CKMBINDEX, TROPONINI,  in the last 168 hours Urinalysis:   Recent Labs Lab 02/20/14 2320 02/23/14 1542  COLORURINE YELLOW YELLOW  LABSPEC 1.034* 1.023  PHURINE 7.5 5.5  GLUCOSEU 500* 500*  HGBUR NEGATIVE SMALL*  BILIRUBINUR NEGATIVE SMALL*  KETONESUR 15* 40*  PROTEINUR NEGATIVE NEGATIVE  UROBILINOGEN 0.2 0.2  NITRITE NEGATIVE NEGATIVE  LEUKOCYTESUR NEGATIVE NEGATIVE   Lipid Panel    Component Value Date/Time   CHOL 159 02/21/2014 0255   TRIG 117 02/21/2014 0255   HDL 44 02/21/2014 0255   CHOLHDL 3.6 02/21/2014 0255   VLDL 23 02/21/2014 0255   LDLCALC 92 02/21/2014 0255   HgbA1C  Lab Results  Component Value Date   HGBA1C 8.2* 02/21/2014    Urine Drug Screen:     Component Value Date/Time   LABOPIA NONE DETECTED 02/20/2014 2319   COCAINSCRNUR NONE  DETECTED 02/20/2014 2319   LABBENZ POSITIVE* 02/20/2014 2319   AMPHETMU NONE DETECTED 02/20/2014 2319   THCU NONE DETECTED 02/20/2014 2319   LABBARB NONE DETECTED 02/20/2014 2319    Alcohol Level:   Recent Labs Lab 02/20/14 1725  ETH <11    CT angio head and neck  02/20/2014    Acute left MCA infarct with progressive edema in the left insula. CTA suboptimal but does reveal thrombus in the left M2 segment.  Large right chronic MCA infarct with possible extension over the convexity.  Severe intracranial atherosclerotic disease with diffuse vessel calcification and atherosclerotic irregularity.  Atherosclerotic calcification in the carotid bifurcation bilaterally. There appears to be 75 % stenosis on the right and mild stenosis on the left although quantification of stenosis is not possible due to motion and suboptimal arterial enhancement.   Cerebral Angiogram 02/20/2014 L CFA approach. 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 Procedure stopped.  MRI of the brain  The present examination is limited to diffusion and ADC imaging. Patient was not able to complete further imaging despite medication. Moderate to slightly large size acute infarct centered at the left peri operculum region involving the left subinsular region bordering the left lenticular nucleus and extending into the left corona radiata. Limited for evaluating for associated hemorrhage.   MRA of the brain  Could not tolerate  2D Echocardiogram  Technically difficult study with poor acoustic windows. Normal LV size with low normal to mildly reduced systolic function, EF 99991111. In some views, there appeared to be septal and possibly apical hypokinesis but difficult images. Normal RV size and systolic function. TEE would help with assess LV function and wall motion.   Carotid Doppler   No evidence of hemodynamically significant internal carotid artery stenosis. Vertebral artery flow is antegrade.   CXR  02/21/2014   Borderline cardiomegaly, no acute pulmonary process.    EKG  sinus tachycardia. For complete results please see formal report.   Therapy Recommendations SNF  Physical Exam   Frail middle aged lady not in distress. Has bilateral above knee amputations.Awake alert. Afebrile. Head is nontraumatic. Neck is supple without bruit. Hearing is normal. Cardiac exam no murmur or gallop. Lungs are clear to auscultation. Distal pulses are not felt in lower extremities.. Neurological Exam :  Awake alert. Expressive greater than receptive aphasia. She speaks short sentences only. Follows simple midline and one-step commands only. Left gaze preference but can look to the right past midline. Blinks to threat on the right but not somnolent. Left lower facial weakness. Tongue midline. Fundi not visualized. Left approximating 3/5 weakness with spasticity and increased tone. Mild left hip flexor weakness. Normal strength on the right and able to move right upper extremity and right thigh against   gravity. Plantars cannot be checked. Sensation is intact. Gait cannot be tested.  ASSESSMENT Ms. [name] is a 48 y.o. female presenting with sudden onset expressive aphasia. Imaging confirms an old right MCA infarct and a new left MCA branch insular infarct in this vasculopath. 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 MRI does confirm new left brain moderate size MCA thronmbotic infarct. On aspirin 81 mg orally every day and clopidogrel 75 mg orally every day prior to admission. Now on aspirin 81 mg orally every day and clopidogrel 75 mg orally every day for secondary stroke prevention. Patient with resultant global aphasia. Work up completed.  Hypertension Diabetes, HgbA1c 8.2, goal < 7.0  LDL 92  COPD  Obesity, Body  mass index is 34.86 kg/(m^2).   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   SNF placement, bed search underway  Medically ready for discharge once disposition determined.     Antony Contras, MD

## 2014-02-25 NOTE — Discharge Summary (Addendum)
Stroke Discharge Summary  Patient ID: [name]   MRN: [id number]      DOB: January 10, 1966  Date of Admission: 02/20/2014 Date of Discharge: 03/03/2014  Attending Physician:  Suzzanne Cloud, MD, Stroke MD  Consulting Physician(s):   Treatment Team:  Md Stroke, MD  Patient's PCP:  No primary provider on file.  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  Past Medical History  Diagnosis Date  . Stroke   . MI (myocardial infarction)   . Hypertension   . Asthma   . COPD (chronic obstructive pulmonary disease)   . GERD (gastroesophageal reflux disease)   . Anemia   . Diabetes mellitus without complication   . Anxiety   . Cigarette smoker 02/25/2014  . Obesity, unspecified 02/25/2014  . Hx of ischemic right MCA stroke 02/25/2014   Past Surgical History  Procedure Laterality Date  . Above knee leg amputation Bilateral   . Angioplasty        Medication List    STOP taking these medications       insulin aspart 100 UNIT/ML injection  Commonly known as:  novoLOG     insulin regular 100 units/mL injection  Commonly known as:  NOVOLIN R,HUMULIN R     oxyCODONE 15 MG immediate release tablet  Commonly known as:  ROXICODONE     promethazine 25 MG tablet  Commonly known as:  PHENERGAN      TAKE these medications       acetaminophen 650 MG CR tablet  Commonly known as:  TYLENOL  Take 650 mg by  mouth every 6 (six) hours as needed for pain.     ALPRAZolam 1 MG tablet  Commonly known as:  XANAX  Take 1 mg by mouth 3 (three) times daily as needed for anxiety.     alum & mag hydroxide-simeth [id number] MG/5ML suspension  Commonly known as:  MAALOX PLUS  Take 30 mLs by mouth every 3 (three) hours as needed for indigestion.     aspirin EC 81 MG tablet  Take 81 mg by mouth daily.     carvedilol 3.125 MG tablet  Commonly known as:  COREG  Take 3.125 mg by mouth 2 (two) times daily with a meal.     clopidogrel 75 MG tablet  Commonly known as:  PLAVIX  Take 75 mg by mouth daily with breakfast.     digoxin 0.125 MG tablet  Commonly known as:  LANOXIN  Take 0.125 mg by mouth daily.     docusate sodium 100 MG capsule  Commonly known as:  COLACE  Take 100 mg by mouth 2 (two) times daily as needed for mild constipation.     DULoxetine 60 MG capsule  Commonly known as:  CYMBALTA  Take 60 mg by mouth 2 (two) times daily.     fentaNYL 50 MCG/HR  Commonly known as:  DURAGESIC -  dosed mcg/hr  Place 50 mcg onto the skin every 3 (three) days.     furosemide 20 MG tablet  Commonly known as:  LASIX  Take 20 mg by mouth daily as needed for fluid.     insulin detemir 100 UNIT/ML injection  Commonly known as:  LEVEMIR  Inject 30 Units into the skin at bedtime.     isosorbide mononitrate 15 mg Tb24 24 hr tablet  Commonly known as:  IMDUR  Take 0.5 tablets (15 mg total) by mouth daily.     magnesium hydroxide 400 MG/5ML suspension  Commonly known as:  MILK OF MAGNESIA  Take 30 mLs by mouth daily as needed for mild constipation.     magnesium oxide 400 MG tablet  Commonly known as:  MAG-OX  Take 400 mg by mouth daily.     midodrine 5 MG tablet  Commonly known as:  PROAMATINE  Take 5 mg by mouth 3 (three) times daily with meals.     nicotine 21 mg/24hr patch  Commonly known as:  NICODERM CQ - dosed in mg/24 hours  Place 21 mg onto the skin daily.     nitroGLYCERIN 0.4 MG  SL tablet  Commonly known as:  NITROSTAT  Place 0.4 mg under the tongue every 5 (five) minutes as needed for chest pain.     pantoprazole 40 MG tablet  Commonly known as:  PROTONIX  Take 40 mg by mouth daily.     pregabalin 50 MG capsule  Commonly known as:  LYRICA  Take 50 mg by mouth 3 (three) times daily.     ranolazine 500 MG 12 hr tablet  Commonly known as:  RANEXA  Take 500 mg by mouth 2 (two) times daily.     rosuvastatin 10 MG tablet  Commonly known as:  CRESTOR  Take 10 mg by mouth at bedtime.        LABORATORY STUDIES CBC    Component Value Date/Time   WBC 11.1* 02/23/2014 0923   RBC 4.49 02/23/2014 0923   HGB 12.5 02/23/2014 0923   HCT 37.3 02/23/2014 0923   PLT 331 02/23/2014 0923   MCV 83.1 02/23/2014 0923   MCH 27.8 02/23/2014 0923   MCHC 33.5 02/23/2014 0923   RDW 17.7* 02/23/2014 0923   LYMPHSABS 2.2 02/20/2014 1725   MONOABS 0.5 02/20/2014 1725   EOSABS 0.0 02/20/2014 1725   BASOSABS 0.0 02/20/2014 1725   CMP    Component Value Date/Time   NA 139 02/27/2014 1010   K 4.4 02/27/2014 1010   CL 102 02/27/2014 1010   CO2 26 02/27/2014 1010   GLUCOSE 71 02/27/2014 1010   BUN 7 02/27/2014 1010   CREATININE 0.32* 02/27/2014 1010   CALCIUM 9.2 02/27/2014 1010   PROT 7.6 02/20/2014 1725   ALBUMIN 3.5 02/20/2014 1725   AST 11 02/20/2014 1725   ALT 14 02/20/2014 1725   ALKPHOS 122* 02/20/2014 1725   BILITOT 0.2* 02/20/2014 1725   GFRNONAA >90 02/27/2014 1010   GFRAA >90 02/27/2014 1010   COAGS Lab Results  Component Value Date   INR 0.95 02/20/2014   Lipid Panel    Component Value Date/Time   CHOL 159 02/21/2014 0255   TRIG 117 02/21/2014 0255   HDL 44 02/21/2014 0255   CHOLHDL 3.6 02/21/2014 0255   VLDL 23 02/21/2014 0255   LDLCALC 92 02/21/2014 0255   HgbA1C  Lab Results  Component Value Date   HGBA1C 8.2* 02/21/2014   Cardiac Panel (last 3  results) No results found for this basename: CKTOTAL, CKMB, TROPONINI, RELINDX,  in the last 72 hours Urinalysis    Component Value  Date/Time   COLORURINE YELLOW 02/23/2014 1542   APPEARANCEUR CLOUDY* 02/23/2014 1542   LABSPEC 1.023 02/23/2014 1542   PHURINE 5.5 02/23/2014 1542   GLUCOSEU 500* 02/23/2014 1542   HGBUR SMALL* 02/23/2014 1542   BILIRUBINUR SMALL* 02/23/2014 1542   KETONESUR 40* 02/23/2014 1542   PROTEINUR NEGATIVE 02/23/2014 1542   UROBILINOGEN 0.2 02/23/2014 1542   NITRITE NEGATIVE 02/23/2014 1542   LEUKOCYTESUR NEGATIVE 02/23/2014 1542   Urine Drug Screen     Component Value Date/Time   LABOPIA NONE DETECTED 02/20/2014 [place] DETECTED 02/20/2014 2319   LABBENZ POSITIVE* 02/20/2014 2319   AMPHETMU NONE DETECTED 02/20/2014 2319   THCU NONE DETECTED 02/20/2014 2319   LABBARB NONE DETECTED 02/20/2014 2319    Alcohol Level    Component Value Date/Time   ETH <11 02/20/2014 1725     SIGNIFICANT DIAGNOSTIC STUDIES CT angio head and neck 02/20/2014 Acute left MCA infarct with progressive edema in the left insula. CTA suboptimal but does reveal thrombus in the left M2 segment. Large right chronic MCA infarct with possible extension over the convexity. Severe intracranial atherosclerotic disease with diffuse vessel calcification and atherosclerotic irregularity. Atherosclerotic calcification in the carotid bifurcation bilaterally. There appears to be 75 % stenosis on the right and mild stenosis on the left although quantification of stenosis is not possible due to motion and suboptimal arterial enhancement.  Cerebral Angiogram 02/20/2014 L CFA approach. 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 Procedure stopped.  MRI of the brain 02/22/2014 The present examination is limited to diffusion and ADC imaging. Patient was not able to complete further imaging despite medication. Moderate to slightly large size acute infarct centered at the left peri operculum  region involving the left subinsular region bordering the left lenticular nucleus and extending into the left corona radiata. Limited for evaluating for associated hemorrhage.  MRA of the brain Could not tolerate  2D Echocardiogram Technically difficult study with poor acoustic windows. Normal LV size with low normal to mildly reduced systolic function, EF [id number]. In some views, there appeared to be septal and possibly apical hypokinesis but difficult images. Normal RV size and systolic function. TEE would help with assess LV function and wall motion.  Carotid Doppler No evidence of hemodynamically significant internal carotid artery stenosis. Vertebral artery flow is antegrade.  CXR  02/23/2014 No acute disease. 02/21/2014 Borderline cardiomegaly, no acute pulmonary process.  EKG sinus tachycardia. For complete results please see formal report.      History of Present Illness   [name] is a 48 y.o. female with a history of previous infarcts who presents with sudden onset aphasia earlier today 02/20/2014 at 1400. She was brought to the ED in [place] where a CT was performed which showed(per report) a new stroke that was not apparent on a CT done 12 days earlier. She was therefore not a tpa candidate and was transferred to [facility] 02/20/2014 1708 for further evaluation. Here, she was found to be aphasic and a CTA was obtained suggesting left parietal branch M2 occlusion and therefore she was felt to be a possible intervention candidate. She has a history of multiple strokes with left sided weakness. Patient was not administerd TPA . She was admitted for further evaluation  and treatment.   Hospital Course Patient was found to have an old right MCA infarct and a new moderate size left MCA branch insular infarct in this known vasculopath. New infarct felt to be thrombotic secondary to large vessel disease. We were unable to complete an angiogram to confirm occlusion due to aortic occlusion and inability to  cannulate. She was on aspirin 81 mg orally every day and clopidogrel 75 mg orally every day prior to admission. Due to large vessel disease, she was continued on aspirin 81 mg orally every day and clopidogrel 75 mg orally every day for secondary stroke prevention.   Patient with vascular risk factors of:  Hypertension. BP low normal in hospital. imdur added. Coreg was held for SBP< 110. BP normalizing.  Diabetes, HgbA1c 8.2, goal < 7.0  Patient with hypoglycemia in hospital - likely related to decreased intake due to lethargy post ativan administration for MRI. levimir dose decreased by 50%. Once lethargy improved, home dose resumed. Glucoses stabilized. Obesity, Body mass index is 34.86 kg/(m^2).  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 Physical therapy, occupational therapy and speech therapy evaluated patient. They recommend SNF placement. Patient lived with ex-boyfriend prior to admission. Family now agree SNF placement best. SW has looked in multiple counties for a bed and was unable to secure. Family has decided to take her home. Will support her with Home Health followup   Discharge Exam  Blood pressure 98/68, pulse 76, temperature 98.3 F (36.8 C), temperature source Oral, resp. rate 20, height 4' (1.219 m), weight 51.8 kg (114 lb 3.2 oz), SpO2 96.00%.  Frail middle aged lady not in distress. Has bilateral above knee amputations.Awake alert. Afebrile. Head is nontraumatic. Neck is supple without bruit. Hearing is normal. Cardiac exam no murmur or gallop. Lungs are clear to auscultation. Distal pulses are not felt in lower extremities..  Neurological Exam : Awake alert. Expressive greater than receptive aphasia. She speaks short sentences only. Follows simple midline and one-step commands only.  Left gaze preference but can look to the right past midline. Blinks to threat on the right but not somnolent. Left lower facial weakness. Tongue midline. Fundi not visualized. Left approximating 3/5 weakness with spasticity and increased tone. Mild left hip flexor weakness. Normal strength on the right and able to move right upper extremity and right thigh against gravity. Plantars cannot be checked. Sensation is intact. Gait cannot be tested.   Discharge Diet     Dysphagia 1 thin liquids  Discharge Plan    Disposition:  Home with family  Home Health:  PT, OT, ST, RN, NA and SW  Continue aspirin 81 mg orally every day and clopidogrel 75 mg orally every day for secondary stroke prevention.   Ongoing risk factor control by SNF Physician and providers  Follow-up with Dr. Antony Contras, Stroke Clinic in 2 months.  45 minutes were spent preparing discharge.  Signed Burnetta Sabin, AVNP-BC, ANP-BC, [facility] Nurse Practitioner 03/03/2014, 12:23 PM  I have personally examined this patient, reviewed pertinent data and developed the plan of care. I agree with above.  Antony Contras, MD

## 2014-02-25 NOTE — Social Work (Signed)
SNF bed offer at [facility] has been rescinded because she only has 4 Medicare days left- scrambling to find another SNF option with Medicaid pending but nothing resolved as of yet. Updated family and have faxed the [place] out to area counties to seek options- advised family once offers are rec'd they will have to pick from available options or take her home.  CSW will update- Eduard Clos, MSW, [place]

## 2014-02-26 LAB — GLUCOSE, CAPILLARY
GLUCOSE-CAPILLARY: 119 mg/dL — AB (ref 70–99)
GLUCOSE-CAPILLARY: 93 mg/dL (ref 70–99)
GLUCOSE-CAPILLARY: 190 mg/dL — AB (ref 70–99)
Glucose-Capillary: 116 mg/dL — ABNORMAL HIGH (ref 70–99)
Glucose-Capillary: 132 mg/dL — ABNORMAL HIGH (ref 70–99)
Glucose-Capillary: 218 mg/dL — ABNORMAL HIGH (ref 70–99)

## 2014-02-26 MED ORDER — POTASSIUM CHLORIDE CRYS ER 20 MEQ PO TBCR
20.0000 meq | EXTENDED_RELEASE_TABLET | Freq: Two times a day (BID) | ORAL | Status: AC
  Administered 2014-02-26 (×2): 20 meq via ORAL
  Filled 2014-02-26 (×2): qty 1

## 2014-02-26 MED ORDER — SODIUM CHLORIDE 0.9 % IV BOLUS (SEPSIS)
500.0000 mL | Freq: Once | INTRAVENOUS | Status: AC
  Administered 2014-02-26: 07:00:00 via INTRAVENOUS

## 2014-02-26 MED ORDER — PANTOPRAZOLE SODIUM 40 MG PO TBEC
40.0000 mg | DELAYED_RELEASE_TABLET | Freq: Every day | ORAL | Status: DC
  Administered 2014-02-26 – 2014-03-01 (×4): 40 mg via ORAL
  Filled 2014-02-26 (×4): qty 1

## 2014-02-26 NOTE — Progress Notes (Signed)
Stroke Team Progress Note  HISTORY [name] is a 48 y.o. female with a history of previous infarcts who presents with sudden onset aphasia on 02/20/2014 at 1400. She was brought to the ED in [place] where a CT was performed which showed(per report) a new stroke that was not apparent on a CT done 12 days earlier. She was therefore not a tpa candidate and was transferred to [facility] 02/20/2014 1708 for further evaluation. Here, she was found to be aphasic and a CTA was obtained suggesting left parietal branch M2 occlusion and therefore she was felt to be a possible intervention candidate.  She has a history of multiple strokes with left sided weakness. Patient was not administerd TPA . She was admitted for further evaluation and treatment.  SUBJECTIVE There no family members present. The patient was no complaints. The patient's nurse reported occasional low blood pressures.  OBJECTIVE Most recent Vital Signs: Filed Vitals:   02/25/14 2113 02/26/14 0106 02/26/14 0527 02/26/14 0618  BP: 112/68 115/67 93/56 92/58   Pulse: 71 70 72   Temp: 99.7 F (37.6 C) 99.6 F (37.6 C) 99.1 F (37.3 C)   TempSrc: Oral Oral Oral   Resp: 18 18 18    Height:      Weight:      SpO2: 100% 98% 97%    CBG (last 3)   Recent Labs  02/25/14 1953 02/25/14 2342 02/26/14 0423  GLUCAP 218* 132* 119*    IV Fluid Intake:      MEDICATIONS  . aspirin EC  81 mg Oral Daily  . atorvastatin  20 mg Oral q1800  . carvedilol  3.125 mg Oral BID WC  . clopidogrel  75 mg Oral Q breakfast  . digoxin  0.125 mg Oral Q M,W,F  . DULoxetine  60 mg Oral BID  . enoxaparin (LOVENOX) injection  40 mg Subcutaneous Q24H  . fentaNYL  50 mcg Transdermal Q72H  . influenza vac split quadrivalent PF  0.5 mL Intramuscular Tomorrow-1000  . insulin aspart  0-9 Units Subcutaneous TID WC  . insulin detemir  30 Units Subcutaneous QHS  . magnesium oxide  400 mg Oral Daily  . midodrine  5 mg Oral TID WC  . nicotine  21 mg Transdermal Q24H   . pantoprazole (PROTONIX) IV  40 mg Intravenous QHS  . pregabalin  50 mg Oral TID  . ranolazine  500 mg Oral BID   PRN:    Diet:  Dysphagia 2 thin liquids Activity:  OOB with assistance DVT Prophylaxis:  Lovenox 40 mg sq daily   CLINICALLY SIGNIFICANT STUDIES Basic Metabolic Panel:   Recent Labs Lab 02/20/14 1725 02/20/14 1748 02/23/14 0923  NA 139 140 139  K 3.9 3.7 3.4*  CL 100 102 103  CO2 23  --  18*  GLUCOSE 252* 261* 71  BUN 9 7 9   CREATININE 0.28* 0.30* 0.31*  CALCIUM 9.5  --  8.9   Liver Function Tests:   Recent Labs Lab 02/20/14 1725  AST 11  ALT 14  ALKPHOS 122*  BILITOT 0.2*  PROT 7.6  ALBUMIN 3.5   CBC:   Recent Labs Lab 02/20/14 1725 02/20/14 1748 02/23/14 0923  WBC 12.2*  --  11.1*  NEUTROABS 9.4*  --   --   HGB 13.2 14.3 12.5  HCT 39.2 42.0 37.3  MCV 83.6  --  83.1  PLT 319  --  331   Coagulation:   Recent Labs Lab 02/20/14 1725  LABPROT 12.5  INR  0.95   Cardiac Enzymes: No results found for this basename: CKTOTAL, CKMB, CKMBINDEX, TROPONINI,  in the last 168 hours Urinalysis:   Recent Labs Lab 02/20/14 2320 02/23/14 1542  COLORURINE YELLOW YELLOW  LABSPEC 1.034* 1.023  PHURINE 7.5 5.5  GLUCOSEU 500* 500*  HGBUR NEGATIVE SMALL*  BILIRUBINUR NEGATIVE SMALL*  KETONESUR 15* 40*  PROTEINUR NEGATIVE NEGATIVE  UROBILINOGEN 0.2 0.2  NITRITE NEGATIVE NEGATIVE  LEUKOCYTESUR NEGATIVE NEGATIVE   Lipid Panel    Component Value Date/Time   CHOL 159 02/21/2014 0255   TRIG 117 02/21/2014 0255   HDL 44 02/21/2014 0255   CHOLHDL 3.6 02/21/2014 0255   VLDL 23 02/21/2014 0255   LDLCALC 92 02/21/2014 0255   HgbA1C  Lab Results  Component Value Date   HGBA1C 8.2* 02/21/2014    Urine Drug Screen:     Component Value Date/Time   LABOPIA NONE DETECTED 02/20/2014 2319   COCAINSCRNUR NONE DETECTED 02/20/2014 2319   LABBENZ POSITIVE* 02/20/2014 2319   AMPHETMU NONE DETECTED 02/20/2014 2319   THCU NONE DETECTED 02/20/2014 2319   LABBARB  NONE DETECTED 02/20/2014 2319    Alcohol Level:   Recent Labs Lab 02/20/14 1725  ETH <11    CT angio head and neck  02/20/2014    Acute left MCA infarct with progressive edema in the left insula. CTA suboptimal but does reveal thrombus in the left M2 segment.  Large right chronic MCA infarct with possible extension over the convexity.  Severe intracranial atherosclerotic disease with diffuse vessel calcification and atherosclerotic irregularity.  Atherosclerotic calcification in the carotid bifurcation bilaterally. There appears to be 75 % stenosis on the right and mild stenosis on the left although quantification of stenosis is not possible due to motion and suboptimal arterial enhancement.   Cerebral Angiogram 02/20/2014 L CFA approach. 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 Procedure stopped.  MRI of the brain  The present examination is limited to diffusion and ADC imaging. Patient was not able to complete further imaging despite medication. Moderate to slightly large size acute infarct centered at the left peri operculum region involving the left subinsular region bordering the left lenticular nucleus and extending into the left corona radiata. Limited for evaluating for associated hemorrhage.   MRA of the brain  Could not tolerate  2D Echocardiogram  Technically difficult study with poor acoustic windows. Normal LV size with low normal to mildly reduced systolic function, EF 96-29%. In some views, there appeared to be septal and possibly apical hypokinesis but difficult images. Normal RV size and systolic function. TEE would help with assess LV function and wall motion. Grade 1 diastolic dysfunction.  Carotid Doppler  No evidence of hemodynamically significant internal carotid artery stenosis. Vertebral artery flow is antegrade.    CXR  02/21/2014   Borderline cardiomegaly, no acute pulmonary process.    EKG  sinus tachycardia. For complete results please see formal report.   Therapy Recommendations SNF  Physical Exam   Frail middle aged lady not in distress. Has bilateral above knee amputations.Awake alert. Afebrile. Head is nontraumatic. Neck is supple without bruit. Hearing is normal. Cardiac exam no murmur or gallop. Lungs are clear to auscultation. Distal pulses are not felt in lower extremities.. Neurological Exam : Awake alert. Expressive greater than receptive aphasia. She speaks short sentences only. Follows simple midline and one-step commands only. Left gaze preference but can  look to the right past midline. Blinks to threat on the right but not somnolent. Left lower facial weakness. Tongue midline. Fundi not visualized. Left approximating 3/5 weakness with spasticity and increased tone. Mild left hip flexor weakness. Normal strength on the right and able to move right upper extremity and right thigh against   gravity. Plantars cannot be checked. Sensation is intact. Gait cannot be tested.  ASSESSMENT [name] is a 49 y.o. female presenting with sudden onset expressive aphasia. Imaging confirms an old right MCA infarct and a new left MCA branch insular infarct in this vasculopath. 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 MRI does confirm new left brain moderate size MCA thronmbotic infarct. On aspirin 81 mg orally every day and clopidogrel 75 mg orally every day prior to admission. Now on aspirin 81 mg orally every day and clopidogrel 75 mg orally every day for secondary stroke prevention. Patient with resultant global aphasia. Work up completed.  Hypertension  Diabetes, HgbA1c 8.2, goal < 7.0  LDL 92  COPD  Obesity, Body mass index is 34.86 kg/(m^2).   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   Medically ready for discharge    Discharge to skilled nursing facility on Monday.  Supplement potassium. Check be met in a.m.. Hold Coreg for systolic blood pressure less than 100 with normal ejection fraction. Patient to receive 500 ml bolus of normal saline per Dr. Doy Mince.  Mikey Bussing PA-C [facility] Pager [phone] 02/26/2014, 8:12 AM  Awaiting placement to skilled nursing home, appreciate social work eval.  Irish Elders, Alease Frame

## 2014-02-26 NOTE — Progress Notes (Signed)
Patient's B/P with Dinamap at 0527 was 93/56.  Her manual B/P at 0618 was 92/58.  When asked if she is dizzy, she nods her head "Yes".  Dr. Doy Mince called and made aware.  Per MD order, 500 cc NS Bolus initiated at 0701.  Will continue to monitor patient.  Amyiah Gaba RN-BC, Joelene Millin

## 2014-02-27 LAB — BASIC METABOLIC PANEL
BUN: 7 mg/dL (ref 6–23)
CALCIUM: 9.2 mg/dL (ref 8.4–10.5)
CO2: 26 mEq/L (ref 19–32)
Chloride: 102 mEq/L (ref 96–112)
Creatinine, Ser: 0.32 mg/dL — ABNORMAL LOW (ref 0.50–1.10)
GFR calc Af Amer: >90 mL/min (ref >90)
Glucose, Bld: 71 mg/dL (ref 70–99)
Potassium: 4.4 mEq/L (ref 3.7–5.3)
Sodium: 139 mEq/L (ref 137–147)

## 2014-02-27 LAB — GLUCOSE, CAPILLARY
GLUCOSE-CAPILLARY: 55 mg/dL — AB (ref 70–99)
GLUCOSE-CAPILLARY: 228 mg/dL — AB (ref 70–99)
Glucose-Capillary: 76 mg/dL (ref 70–99)
Glucose-Capillary: 82 mg/dL (ref 70–99)
Glucose-Capillary: 193 mg/dL — ABNORMAL HIGH (ref 70–99)

## 2014-02-27 MED ORDER — DEXTROSE 50 % IV SOLN: 25.0000 mL | INTRAVENOUS | Status: DC | PRN

## 2014-02-27 MED ORDER — ISOSORBIDE MONONITRATE 15 MG HALF TABLET
15.0000 mg | ORAL_TABLET | Freq: Every day | ORAL | Status: DC
  Administered 2014-02-27 – 2014-03-02 (×4): 15 mg via ORAL
  Filled 2014-02-27 (×4): qty 1

## 2014-02-27 NOTE — Progress Notes (Signed)
Stroke Team Progress Note  HISTORY [name] is a 48 y.o. female with a history of previous infarcts who presents with sudden onset aphasia on 02/20/2014 at 1400. She was brought to the ED in [place] where a CT was performed which showed(per report) a new stroke that was not apparent on a CT done 12 days earlier. She was therefore not a tpa candidate and was transferred to [facility] 02/20/2014 1708 for further evaluation. Here, she was found to be aphasic and a CTA was obtained suggesting left parietal branch M2 occlusion and therefore she was felt to be a possible intervention candidate.  She has a history of multiple strokes with left sided weakness. Patient was not administerd TPA . She was admitted for further evaluation and treatment.  SUBJECTIVE The patient expresses no complaints. There are no family members present. We discussed the patient's blood pressure with the patient's nurse. Hold Coreg this a.m.  OBJECTIVE Most recent Vital Signs: Filed Vitals:   02/26/14 1740 02/26/14 2125 02/27/14 0138 02/27/14 0552  BP: 106/54 137/67 93/60 96/53   Pulse: 69 70 73 67  Temp: 97.2 F (36.2 C) 97.4 F (36.3 C) 98.8 F (37.1 C) 98.5 F (36.9 C)  TempSrc: Axillary Axillary Axillary Axillary  Resp: 18 18 18 18   Height:      Weight:      SpO2: 100% 98% 97% 96%   CBG (last 3)   Recent Labs  02/26/14 2110 02/27/14 0628 02/27/14 0655  GLUCAP 218* 55* 76    IV Fluid Intake:      MEDICATIONS  . aspirin EC  81 mg Oral Daily  . atorvastatin  20 mg Oral q1800  . carvedilol  3.125 mg Oral BID WC  . clopidogrel  75 mg Oral Q breakfast  . digoxin  0.125 mg Oral Q M,W,F  . DULoxetine  60 mg Oral BID  . enoxaparin (LOVENOX) injection  40 mg Subcutaneous Q24H  . fentaNYL  50 mcg Transdermal Q72H  . influenza vac split quadrivalent PF  0.5 mL Intramuscular Tomorrow-1000  . insulin aspart  0-9 Units Subcutaneous TID WC  . insulin detemir  30 Units Subcutaneous QHS  . magnesium oxide  400 mg  Oral Daily  . midodrine  5 mg Oral TID WC  . nicotine  21 mg Transdermal Q24H  . pantoprazole  40 mg Oral QHS  . pregabalin  50 mg Oral TID  . ranolazine  500 mg Oral BID   PRN:    Diet:  Dysphagia 2 thin liquids Activity:  OOB with assistance DVT Prophylaxis:  Lovenox 40 mg sq daily   CLINICALLY SIGNIFICANT STUDIES Basic Metabolic Panel:   Recent Labs Lab 02/20/14 1725 02/20/14 1748 02/23/14 0923  NA 139 140 139  K 3.9 3.7 3.4*  CL 100 102 103  CO2 23  --  18*  GLUCOSE 252* 261* 71  BUN 9 7 9   CREATININE 0.28* 0.30* 0.31*  CALCIUM 9.5  --  8.9   Liver Function Tests:   Recent Labs Lab 02/20/14 1725  AST 11  ALT 14  ALKPHOS 122*  BILITOT 0.2*  PROT 7.6  ALBUMIN 3.5   CBC:   Recent Labs Lab 02/20/14 1725 02/20/14 1748 02/23/14 0923  WBC 12.2*  --  11.1*  NEUTROABS 9.4*  --   --   HGB 13.2 14.3 12.5  HCT 39.2 42.0 37.3  MCV 83.6  --  83.1  PLT 319  --  331   Coagulation:   Recent Labs  Lab 02/20/14 1725  LABPROT 12.5  INR 0.95   Cardiac Enzymes: No results found for this basename: CKTOTAL, CKMB, CKMBINDEX, TROPONINI,  in the last 168 hours Urinalysis:   Recent Labs Lab 02/20/14 2320 02/23/14 1542  COLORURINE YELLOW YELLOW  LABSPEC 1.034* 1.023  PHURINE 7.5 5.5  GLUCOSEU 500* 500*  HGBUR NEGATIVE SMALL*  BILIRUBINUR NEGATIVE SMALL*  KETONESUR 15* 40*  PROTEINUR NEGATIVE NEGATIVE  UROBILINOGEN 0.2 0.2  NITRITE NEGATIVE NEGATIVE  LEUKOCYTESUR NEGATIVE NEGATIVE   Lipid Panel    Component Value Date/Time   CHOL 159 02/21/2014 0255   TRIG 117 02/21/2014 0255   HDL 44 02/21/2014 0255   CHOLHDL 3.6 02/21/2014 0255   VLDL 23 02/21/2014 0255   LDLCALC 92 02/21/2014 0255   HgbA1C  Lab Results  Component Value Date   HGBA1C 8.2* 02/21/2014    Urine Drug Screen:     Component Value Date/Time   LABOPIA NONE DETECTED 02/20/2014 2319   COCAINSCRNUR NONE DETECTED 02/20/2014 2319   LABBENZ POSITIVE* 02/20/2014 2319   AMPHETMU NONE DETECTED  02/20/2014 2319   THCU NONE DETECTED 02/20/2014 2319   LABBARB NONE DETECTED 02/20/2014 2319    Alcohol Level:   Recent Labs Lab 02/20/14 1725  ETH <11    CT angio head and neck  02/20/2014    Acute left MCA infarct with progressive edema in the left insula. CTA suboptimal but does reveal thrombus in the left M2 segment.  Large right chronic MCA infarct with possible extension over the convexity.  Severe intracranial atherosclerotic disease with diffuse vessel calcification and atherosclerotic irregularity.  Atherosclerotic calcification in the carotid bifurcation bilaterally. There appears to be 75 % stenosis on the right and mild stenosis on the left although quantification of stenosis is not possible due to motion and suboptimal arterial enhancement.   Cerebral Angiogram 02/20/2014 L CFA approach. 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 Procedure stopped.  MRI of the brain  The present examination is limited to diffusion and ADC imaging. Patient was not able to complete further imaging despite medication. Moderate to slightly large size acute infarct centered at the left peri operculum region involving the left subinsular region bordering the left lenticular nucleus and extending into the left corona radiata. Limited for evaluating for associated hemorrhage.   MRA of the brain  Could not tolerate  2D Echocardiogram  Technically difficult study with poor acoustic windows. Normal LV size with low normal to mildly reduced systolic function, EF [id number]. In some views, there appeared to be septal and possibly apical hypokinesis but difficult images. Normal RV size and systolic function. TEE would help with assess LV function and wall motion. Grade 1 diastolic dysfunction.  Carotid Doppler  No evidence of hemodynamically significant internal  carotid artery stenosis. Vertebral artery flow is antegrade.   CXR  02/21/2014   Borderline cardiomegaly, no acute pulmonary process.    EKG  sinus tachycardia. For complete results please see formal report.   Therapy Recommendations SNF  Physical Exam   Frail middle aged lady not in distress. Has bilateral above knee amputations.Awake alert. Afebrile. Head is nontraumatic. Neck is supple without bruit. Hearing is normal. Cardiac exam no murmur or gallop. Lungs are clear to auscultation. Distal pulses are not felt in lower extremities.. Neurological Exam : Awake alert. Expressive greater than receptive aphasia. She speaks short sentences only. Follows simple midline and  one-step commands only. Left gaze preference but can look to the right past midline. Blinks to threat on the right but not somnolent. Left lower facial weakness. Tongue midline. Fundi not visualized. Left approximating 3/5 weakness with spasticity and increased tone. Mild left hip flexor weakness. Normal strength on the right and able to move right upper extremity and right thigh against   gravity. Plantars cannot be checked. Sensation is intact. Gait cannot be tested.  ASSESSMENT Ms. [name] is a 48 y.o. female presenting with sudden onset expressive aphasia. Imaging confirms an old right MCA infarct and a new left MCA branch insular infarct in this vasculopath. 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 MRI does confirm new left brain moderate size MCA thronmbotic infarct. On aspirin 81 mg orally every day and clopidogrel 75 mg orally every day prior to admission. Now on aspirin 81 mg orally every day and clopidogrel 75 mg orally every day for secondary stroke prevention. Patient with resultant global aphasia. Work up completed.  Hypertension  Diabetes, HgbA1c 8.2, goal < 7.0  LDL 92  COPD  Obesity, Body mass index is 34.86 kg/(m^2).   Bilateral  AKAs  Hx multiple right brain Infarcts in 1998 with resultant Left spastic HP  Hx MI  Cigarette smoker  Mild hypotension  Hypokalemia  Hospital day # 7  TREATMENT/PLAN  Continue aspirin 81 mg orally every day and clopidogrel 75 mg orally every day for secondary stroke prevention.   Medically ready for discharge    Discharge to skilled nursing facility on Monday.  Mild hypotension and hypokalemia - Supplemented potassium. Hold Coreg for systolic blood pressure less than 110 with normal ejection fraction. Patient received 500 ml bolus of normal saline per Dr. Doy Mince yesterday. Check Bmet today. SBP still in the 90s at times. Hold Coreg this am.  Mikey Bussing PA-C [facility] Pager [phone] 02/27/2014, 8:38 AM  Awaiting placement to skilled nursing home, appreciate social work eval.   Hypotensive. Agree with holding correg. Goal MAP>65. Awaiting placement

## 2014-02-27 NOTE — Progress Notes (Signed)
CRITICAL VALUE ALERT  Critical value received:  Blood sugar 55  Date of notification:  02/27/14  Time of notification:  0635  Critical value read back:yes  Nurse who received alert:  Verdie Drown  MD notified (1st page): Fredirick Maudlin NP  Time of first page:  [phone]   MD notified (2nd page):  Time of second page:  Responding MD:  Fredirick Maudlin NP  Time MD responded: 0645    2 cups of OJ administered along with a cup of applesauce. Will recheck and continue to monitor. Verdie Drown RN BSN

## 2014-02-27 NOTE — Progress Notes (Signed)
Nurse reports pt can't swallow Ranexa. Will change to low dose imdur.  Mikey Bussing PA-C [facility] Pager [phone] 02/27/2014, 4:36 PM

## 2014-02-28 LAB — GLUCOSE, CAPILLARY
GLUCOSE-CAPILLARY: 105 mg/dL — AB (ref 70–99)
GLUCOSE-CAPILLARY: 157 mg/dL — AB (ref 70–99)
Glucose-Capillary: 149 mg/dL — ABNORMAL HIGH (ref 70–99)
Glucose-Capillary: 203 mg/dL — ABNORMAL HIGH (ref 70–99)

## 2014-02-28 NOTE — Social Work (Signed)
Working with family and surrounding counties to seek SNF bed offer for patient- at this time we are awaiting callback from [place] who is likely able to offer per family- will update as I learn more.  Eduard Clos, MSW, [place]

## 2014-02-28 NOTE — Progress Notes (Signed)
Physical Therapy Treatment Patient Details Name: [name] MRN: [id number] DOB: 1966-02-04 Today's Date: 02/28/2014 Time: 1000-1040 PT Time Calculation (min): 40 min  PT Assessment / Plan / Recommendation  History of Present Illness [name] is a 48 y.o. female with a history of previous infarcts who presents with sudden onset aphasia earlier today. She was brought to the ED in [place] where a CT was performed which showed(per report) a new stroke that was not apparent on a CT done 12 days earlier. She was therefore not a tpa candidate and was transferred to [facility] for further evaluation. Here, she was found to be aphasic and a CTA was obtained suggesting left parietal branch M2 occlusion and therefore she was felt to be a possible intervention candidate.    PT Comments   Patient more alert this session and able to transfer to recliner via posterior transfer and 2 assist. RN present during transfer. Continue with current POC  Follow Up Recommendations  SNF     Does the patient have the potential to tolerate intense rehabilitation     Barriers to Discharge        Equipment Recommendations  None recommended by PT    Recommendations for Other Services    Frequency Min 2X/week   Progress towards PT Goals Progress towards PT goals: Progressing toward goals  Plan Current plan remains appropriate    Precautions / Restrictions Precautions Precautions: Fall   Pertinent Vitals/Pain no apparent distress     Mobility  Bed Mobility Bed Mobility: Rolling Rolling: Min assist;Max assist Supine to sit: Mod assist General bed mobility comments: When rolling to L pt requires MaxA, however to R pt uses R UE on bed rail to A.  pt required rolling x3 for changing of wet linens and repositioning.   Transfers Overall transfer level: Needs assistance Transfers: Government social research officer transfers: Total assist;+2 safety/equipment General transfer comment: A with use of  chuck pad to posteriorly transfer to recliner and postioning thereafter    Exercises     PT Diagnosis:    PT Problem List:   PT Treatment Interventions:     PT Goals (current goals can now be found in the care plan section)    Visit Information  Last PT Received On: 02/28/14 Assistance Needed: +1 History of Present Illness: [name] is a 48 y.o. female with a history of previous infarcts who presents with sudden onset aphasia earlier today. She was brought to the ED in [place] where a CT was performed which showed(per report) a new stroke that was not apparent on a CT done 12 days earlier. She was therefore not a tpa candidate and was transferred to [facility] for further evaluation. Here, she was found to be aphasic and a CTA was obtained suggesting left parietal branch M2 occlusion and therefore she was felt to be a possible intervention candidate.     Subjective Data      Cognition  Cognition Arousal/Alertness: Awake/alert Behavior During Therapy: Flat affect Overall Cognitive Status: Impaired/Different from baseline Area of Impairment: Attention;Following commands;Safety/judgement;Problem solving;Awareness Following Commands: Follows one step commands with increased time;Follows multi-step commands inconsistently Safety/Judgement: Decreased awareness of safety Problem Solving: Slow processing;Difficulty sequencing;Requires verbal cues;Requires tactile cues;Decreased initiation    Balance  Balance Sitting-balance support: Single extremity supported Sitting balance-Leahy Scale: Poor Sitting balance - Comments: leans posteriorly.   End of Session PT - End of Session Activity Tolerance: Patient tolerated treatment well Patient left: in chair;with call bell/phone within reach Nurse Communication: Mobility status  GP     [name] 02/28/2014, 12:53 PM 02/28/2014 [name] PTA [phone] pager [phone] office

## 2014-02-28 NOTE — Progress Notes (Addendum)
Stroke Team Progress Note  HISTORY [name] is a 48 y.o. female with a history of previous infarcts who presents with sudden onset aphasia on 02/20/2014 at 1400. She was brought to the ED in [place] where a CT was performed which showed(per report) a new stroke that was not apparent on a CT done 12 days earlier. She was therefore not a tpa candidate and was transferred to [facility] 02/20/2014 1708 for further evaluation. Here, she was found to be aphasic and a CTA was obtained suggesting left parietal branch M2 occlusion and therefore she was felt to be a possible intervention candidate.  She has a history of multiple strokes with left sided weakness. Patient was not administerd TPA . She was admitted for further evaluation and treatment.  SUBJECTIVE No complaints this a.m.  OBJECTIVE Most recent Vital Signs: Filed Vitals:   02/28/14 0148 02/28/14 0559 02/28/14 0927 02/28/14 1027  BP: 107/53 102/44 99/54   Pulse: 61 73 96 78  Temp: 97.8 F (36.6 C) 98.9 F (37.2 C) 97.9 F (36.6 C)   TempSrc: Oral Oral Oral   Resp: 16 16 18    Height:      Weight:      SpO2: 95% 95% 96%    CBG (last 3)   Recent Labs  02/27/14 1708 02/27/14 2107 02/28/14 0627  GLUCAP 193* 228* 105*    IV Fluid Intake:      MEDICATIONS  . aspirin EC  81 mg Oral Daily  . atorvastatin  20 mg Oral q1800  . carvedilol  3.125 mg Oral BID WC  . clopidogrel  75 mg Oral Q breakfast  . digoxin  0.125 mg Oral Q M,W,F  . DULoxetine  60 mg Oral BID  . enoxaparin (LOVENOX) injection  40 mg Subcutaneous Q24H  . fentaNYL  50 mcg Transdermal Q72H  . influenza vac split quadrivalent PF  0.5 mL Intramuscular Tomorrow-1000  . insulin aspart  0-9 Units Subcutaneous TID WC  . insulin detemir  30 Units Subcutaneous QHS  . isosorbide mononitrate  15 mg Oral Daily  . magnesium oxide  400 mg Oral Daily  . midodrine  5 mg Oral TID WC  . nicotine  21 mg Transdermal Q24H  . pantoprazole  40 mg Oral QHS  . pregabalin  50 mg Oral  TID   PRN:    Diet:  Dysphagia 2 thin liquids Activity:  OOB with assistance DVT Prophylaxis:  Lovenox 40 mg sq daily   CLINICALLY SIGNIFICANT STUDIES Basic Metabolic Panel:   Recent Labs Lab 02/23/14 0923 02/27/14 1010  NA 139 139  K 3.4* 4.4  CL 103 102  CO2 18* 26  GLUCOSE 71 71  BUN 9 7  CREATININE 0.31* 0.32*  CALCIUM 8.9 9.2   Liver Function Tests:  No results found for this basename: AST, ALT, ALKPHOS, BILITOT, PROT, ALBUMIN,  in the last 168 hours CBC:   Recent Labs Lab 02/23/14 0923  WBC 11.1*  HGB 12.5  HCT 37.3  MCV 83.1  PLT 331   Coagulation:  No results found for this basename: LABPROT, INR,  in the last 168 hours Cardiac Enzymes: No results found for this basename: CKTOTAL, CKMB, CKMBINDEX, TROPONINI,  in the last 168 hours Urinalysis:   Recent Labs Lab 02/23/14 1542  COLORURINE YELLOW  LABSPEC 1.023  PHURINE 5.5  GLUCOSEU 500*  HGBUR SMALL*  BILIRUBINUR SMALL*  KETONESUR 40*  PROTEINUR NEGATIVE  UROBILINOGEN 0.2  NITRITE NEGATIVE  LEUKOCYTESUR NEGATIVE   Lipid Panel  Component Value Date/Time   CHOL 159 02/21/2014 0255   TRIG 117 02/21/2014 0255   HDL 44 02/21/2014 0255   CHOLHDL 3.6 02/21/2014 0255   VLDL 23 02/21/2014 0255   LDLCALC 92 02/21/2014 0255   HgbA1C  Lab Results  Component Value Date   HGBA1C 8.2* 02/21/2014    Urine Drug Screen:     Component Value Date/Time   LABOPIA NONE DETECTED 02/20/2014 2319   COCAINSCRNUR NONE DETECTED 02/20/2014 2319   LABBENZ POSITIVE* 02/20/2014 2319   AMPHETMU NONE DETECTED 02/20/2014 2319   THCU NONE DETECTED 02/20/2014 2319   LABBARB NONE DETECTED 02/20/2014 2319    Alcohol Level:  No results found for this basename: ETH,  in the last 168 hours  CT angio head and neck  02/20/2014    Acute left MCA infarct with progressive edema in the left insula. CTA suboptimal but does reveal thrombus in the left M2 segment.  Large right chronic MCA infarct with possible extension over the convexity.   Severe intracranial atherosclerotic disease with diffuse vessel calcification and atherosclerotic irregularity.  Atherosclerotic calcification in the carotid bifurcation bilaterally. There appears to be 75 % stenosis on the right and mild stenosis on the left although quantification of stenosis is not possible due to motion and suboptimal arterial enhancement.   Cerebral Angiogram 02/20/2014 L CFA approach. 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 Procedure stopped.  MRI of the brain  The present examination is limited to diffusion and ADC imaging. Patient was not able to complete further imaging despite medication. Moderate to slightly large size acute infarct centered at the left peri operculum region involving the left subinsular region bordering the left lenticular nucleus and extending into the left corona radiata. Limited for evaluating for associated hemorrhage.   MRA of the brain  Could not tolerate  2D Echocardiogram  Technically difficult study with poor acoustic windows. Normal LV size with low normal to mildly reduced systolic function, EF 99991111. In some views, there appeared to be septal and possibly apical hypokinesis but difficult images. Normal RV size and systolic function. TEE would help with assess LV function and wall motion. Grade 1 diastolic dysfunction.  Carotid Doppler  No evidence of hemodynamically significant internal carotid artery stenosis. Vertebral artery flow is antegrade.   CXR  02/21/2014   Borderline cardiomegaly, no acute pulmonary process.    EKG  sinus tachycardia. For complete results please see formal report.   Therapy Recommendations SNF  Physical Exam   Frail middle aged lady not in distress. Has bilateral above knee amputations.Awake alert. Afebrile. Head is nontraumatic. Neck is supple without  bruit. Hearing is normal. Cardiac exam no murmur or gallop. Lungs are clear to auscultation. Distal pulses are not felt in lower extremities.. Neurological Exam : Awake alert. Expressive greater than receptive aphasia. She speaks short sentences only. Follows simple midline and one-step commands only. Left gaze preference but can look to the right past midline. Blinks to threat on the right but not somnolent. Left lower facial weakness. Tongue midline. Fundi not visualized. Left approximating 3/5 weakness with spasticity and increased tone. Mild left hip flexor weakness. Normal strength on the right and able to move right upper extremity and right thigh against   gravity. Plantars cannot be checked. Sensation is intact. Gait cannot be tested.  ASSESSMENT Ms. [name] is a 48 y.o. female presenting with  sudden onset expressive aphasia. Imaging confirms an old right MCA infarct and a new left MCA branch insular infarct in this vasculopath. 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 MRI does confirm new left brain moderate size MCA thronmbotic infarct with cytotoxic mild cerebral edema.. On aspirin 81 mg orally every day and clopidogrel 75 mg orally every day prior to admission. Now on aspirin 81 mg orally every day and clopidogrel 75 mg orally every day for secondary stroke prevention. Patient with resultant global aphasia. Work up completed.   Hypertension  Diabetes, HgbA1c 8.2, goal < 7.0  LDL 92  COPD  Obesity, Body mass index is 34.86 kg/(m^2).   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   Monitor blood  pressure, consider discontinuing/decreasing Coreg dose  Medically ready for discharge    Discharge to planned to skilled nursing facility today, however, lack of Medicare days is restricting bed search.   Burnetta Sabin, MSN, RN, ANVP-BC, ANP-BC, [facility] Pager: [phone] 02/28/2014 6:51 PM  I have personally obtained a history, examined the patient, evaluated imaging results, and formulated the assessment and plan of care. I agree with the above. Antony Contras, MD

## 2014-03-01 LAB — GLUCOSE, CAPILLARY
GLUCOSE-CAPILLARY: 171 mg/dL — AB (ref 70–99)
GLUCOSE-CAPILLARY: 199 mg/dL — AB (ref 70–99)
Glucose-Capillary: 187 mg/dL — ABNORMAL HIGH (ref 70–99)
Glucose-Capillary: 252 mg/dL — ABNORMAL HIGH (ref 70–99)

## 2014-03-01 MED ORDER — ISOSORBIDE MONONITRATE 15 MG HALF TABLET: 15.0000 mg | ORAL_TABLET | Freq: Every day | ORAL | Status: DC

## 2014-03-01 NOTE — Progress Notes (Addendum)
Stroke Team Progress Note  HISTORY [name] is a 48 y.o. female with a history of previous infarcts who presents with sudden onset aphasia on 02/20/2014 at 1400. She was brought to the ED in [place] where a CT was performed which showed(per report) a new stroke that was not apparent on a CT done 12 days earlier. She was therefore not a tpa candidate and was transferred to [facility] 02/20/2014 1708 for further evaluation. Here, she was found to be aphasic and a CTA was obtained suggesting left parietal branch M2 occlusion and therefore she was felt to be a possibleintervention candidate.  She has a history of multiple strokes with left sided weakness. Patient was not administerd TPA . She was admitted for further evaluation and treatment.  SUBJECTIVE No family at bedside. Plans for discharge later today to SNF.  OBJECTIVE Most recent Vital Signs: Filed Vitals:   02/28/14 2121 03/01/14 0118 03/01/14 0457 03/01/14 0941  BP: 112/57 89/49 105/57 103/57  Pulse: 71 76 77 90  Temp: 98 F (36.7 C) 98.1 F (36.7 C) 97.9 F (36.6 C) 97.9 F (36.6 C)  TempSrc: Oral  Oral Oral  Resp: 18 20 18 18   Height:      Weight:      SpO2: 92% 94% 96% 97%   CBG (last 3)   Recent Labs  02/28/14 1634 02/28/14 2258 03/01/14 0638  GLUCAP 149* 203* 171*    IV Fluid Intake:      MEDICATIONS  . aspirin EC  81 mg Oral Daily  . atorvastatin  20 mg Oral q1800  . carvedilol  3.125 mg Oral BID WC  . clopidogrel  75 mg Oral Q breakfast  . digoxin  0.125 mg Oral Q M,W,F  . DULoxetine  60 mg Oral BID  . enoxaparin (LOVENOX) injection  40 mg Subcutaneous Q24H  . fentaNYL  50 mcg Transdermal Q72H  . influenza vac split quadrivalent PF  0.5 mL Intramuscular Tomorrow-1000  . insulin aspart  0-9 Units Subcutaneous TID WC  . insulin detemir  30 Units Subcutaneous QHS  . isosorbide mononitrate  15 mg Oral Daily  . magnesium oxide  400 mg Oral Daily  . midodrine  5 mg Oral TID WC  . nicotine  21 mg Transdermal  Q24H  . pantoprazole  40 mg Oral QHS  . pregabalin  50 mg Oral TID   PRN:    Diet:  Dysphagia 2 thin liquids Activity:  OOB with assistance DVT Prophylaxis:  Lovenox 40 mg sq daily   CLINICALLY SIGNIFICANT STUDIES Basic Metabolic Panel:   Recent Labs Lab 02/23/14 0923 02/27/14 1010  NA 139 139  K 3.4* 4.4  CL 103 102  CO2 18* 26  GLUCOSE 71 71  BUN 9 7  CREATININE 0.31* 0.32*  CALCIUM 8.9 9.2   Liver Function Tests:  No results found for this basename: AST, ALT, ALKPHOS, BILITOT, PROT, ALBUMIN,  in the last 168 hours CBC:   Recent Labs Lab 02/23/14 0923  WBC 11.1*  HGB 12.5  HCT 37.3  MCV 83.1  PLT 331   Coagulation:  No results found for this basename: LABPROT, INR,  in the last 168 hours Cardiac Enzymes: No results found for this basename: CKTOTAL, CKMB, CKMBINDEX, TROPONINI,  in the last 168 hours Urinalysis:   Recent Labs Lab 02/23/14 1542  COLORURINE YELLOW  LABSPEC 1.023  PHURINE 5.5  GLUCOSEU 500*  HGBUR SMALL*  BILIRUBINUR SMALL*  KETONESUR 40*  PROTEINUR NEGATIVE  UROBILINOGEN 0.2  NITRITE NEGATIVE  LEUKOCYTESUR NEGATIVE   Lipid Panel    Component Value Date/Time   CHOL 159 02/21/2014 0255   TRIG 117 02/21/2014 0255   HDL 44 02/21/2014 0255   CHOLHDL 3.6 02/21/2014 0255   VLDL 23 02/21/2014 0255   LDLCALC 92 02/21/2014 0255   HgbA1C  Lab Results  Component Value Date   HGBA1C 8.2* 02/21/2014    Urine Drug Screen:     Component Value Date/Time   LABOPIA NONE DETECTED 02/20/2014 2319   COCAINSCRNUR NONE DETECTED 02/20/2014 2319   LABBENZ POSITIVE* 02/20/2014 2319   AMPHETMU NONE DETECTED 02/20/2014 2319   THCU NONE DETECTED 02/20/2014 2319   LABBARB NONE DETECTED 02/20/2014 2319    Alcohol Level:  No results found for this basename: ETH,  in the last 168 hours  CT angio head and neck  02/20/2014    Acute left MCA infarct with progressive edema in the left insula. CTA suboptimal but does reveal thrombus in the left M2 segment.  Large right  chronic MCA infarct with possible extension over the convexity.  Severe intracranial atherosclerotic disease with diffuse vessel calcification and atherosclerotic irregularity.  Atherosclerotic calcification in the carotid bifurcation bilaterally. There appears to be 75 % stenosis on the right and mild stenosis on the left although quantification of stenosis is not possible due to motion and suboptimal arterial enhancement.   Cerebral Angiogram 02/20/2014 L CFA approach. 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 Procedure stopped.  MRI of the brain  The present examination is limited to diffusion and ADC imaging. Patient was not able to complete further imaging despite medication. Moderate to slightly large size acute infarct centered at the left peri operculum region involving the left subinsular region bordering the left lenticular nucleus and extending into the left corona radiata. Limited for evaluating for associated hemorrhage.   MRA of the brain  Could not tolerate  2D Echocardiogram  Technically difficult study with poor acoustic windows. Normal LV size with low normal to mildly reduced systolic function, EF 99991111. In some views, there appeared to be septal and possibly apical hypokinesis but difficult images. Normal RV size and systolic function. TEE would help with assess LV function and wall motion. Grade 1 diastolic dysfunction.  Carotid Doppler  No evidence of hemodynamically significant internal carotid artery stenosis. Vertebral artery flow is antegrade.   CXR  02/21/2014   Borderline cardiomegaly, no acute pulmonary process.    EKG  sinus tachycardia. For complete results please see formal report.   Therapy Recommendations SNF  Physical Exam   Frail middle aged lady not in distress. Has bilateral above knee amputations.Awake  alert. Afebrile. Head is nontraumatic. Neck is supple without bruit. Hearing is normal. Cardiac exam no murmur or gallop. Lungs are clear to auscultation. Distal pulses are not felt in lower extremities.. Neurological Exam : Awake alert. Expressive greater than receptive aphasia. She speaks short sentences only. Follows simple midline and one-step commands only. Left gaze preference but can look to the right past midline. Blinks to threat on the right but not somnolent. Left lower facial weakness. Tongue midline. Fundi not visualized. Left approximating 3/5 weakness with spasticity and increased tone. Mild left hip flexor weakness. Normal strength on the right and able to move right upper extremity and right thigh against   gravity. Plantars cannot be checked. Sensation is intact. Gait cannot be tested.  ASSESSMENT Ms. Berkleigh Imholte is a 48 y.o. female presenting with sudden onset expressive aphasia. Imaging confirms an old right MCA infarct and a new left MCA branch insular infarct in this vasculopath. 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 MRI does confirm new left brain moderate size MCA thronmbotic infarct with cytotoxic mild cerebral edema.. On aspirin 81 mg orally every day and clopidogrel 75 mg orally every day prior to admission. Now on aspirin 81 mg orally every day and clopidogrel 75 mg orally every day for secondary stroke prevention. Patient with resultant global aphasia. Work up completed.   Hypertension  Diabetes, HgbA1c 8.2, goal < 7.0  LDL 92  COPD  Obesity, Body mass index is 34.86 kg/(m^2).   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   Continue to Monitor blood pressure  Medically ready for discharge    Discharge planned to skilled nursing facility today  Burnetta Sabin, MSN, RN, ANVP-BC, ANP-BC, [facility] Pager: [phone] 03/01/2014 10:40 AM  I have personally obtained a history, examined the patient, evaluated imaging results, and formulated the assessment and plan of care. I agree with the above. Antony Contras, MD

## 2014-03-01 NOTE — Progress Notes (Signed)
Occupational Therapy Treatment Patient Details Name: Oluwaferanmi Mceuen MRN: [id number] DOB: December 17, 1966 Today's Date: 03/01/2014 Time: [id number] OT Time Calculation (min): 23 min  OT Assessment / Plan / Recommendation  History of present illness Sebrina Cadorette is a 48 y.o. female with a history of previous infarcts who presents with sudden onset aphasia earlier today. She was brought to the ED in [place] where a CT was performed which showed(per report) a new stroke that was not apparent on a CT done 12 days earlier. She was therefore not a tpa candidate and was transferred to [facility] for further evaluation. Here, she was found to be aphasic and a CTA was obtained suggesting left parietal branch M2 occlusion and therefore she was felt to be a possible intervention candidate.    OT comments  Pt. Awake and agreeable to participation in skilled o.t. Upon arrival.  Requires total a x 1 for all aspects of ub/lb self care.  Was able to assist some with rolling l/r for changing linen.  Able to self feed lunch after set up and opening of containers.    Follow Up Recommendations  SNF;Supervision/Assistance - 24 hour           Equipment Recommendations  None recommended by OT        Frequency Min 2X/week   Progress towards OT Goals Progress towards OT goals: Progressing toward goals  Plan Discharge plan remains appropriate    Precautions / Restrictions Precautions Precautions: Fall   Pertinent Vitals/Pain No c/o per pt.    ADL  Eating/Feeding: Performed;Set up Where Assessed - Eating/Feeding: Bed level Grooming: Performed;Wash/dry hands;Wash/dry face;Moderate assistance Where Assessed - Grooming: Supine, head of bed up Upper Body Bathing: Performed;+1 Total assistance Where Assessed - Upper Body Bathing: Supine, head of bed up Lower Body Bathing: Performed;+1 Total assistance Where Assessed - Lower Body Bathing: Supine, head of bed up Upper Body Dressing: Performed;+1 Total assistance Where  Assessed - Upper Body Dressing: Supine, head of bed up ADL Comments: pt. currently total a x 1 for all aspects of ub/lb self care, set up for self feeding ie: opened all containers and placed tray in optimal position and pt. was able to feed self with no spillage noted    OT Goals(current goals can now be found in the care plan section)    Visit Information  Last OT Received On: 03/01/14 History of Present Illness: Creina Carleo is a 48 y.o. female with a history of previous infarcts who presents with sudden onset aphasia earlier today. She was brought to the ED in [place] where a CT was performed which showed(per report) a new stroke that was not apparent on a CT done 12 days earlier. She was therefore not a tpa candidate and was transferred to [facility] for further evaluation. Here, she was found to be aphasic and a CTA was obtained suggesting left parietal branch M2 occlusion and therefore she was felt to be a possible intervention candidate.                  Cognition  Cognition Arousal/Alertness: Awake/alert Behavior During Therapy: Flat affect Overall Cognitive Status: Impaired/Different from baseline Area of Impairment: Attention;Following commands;Safety/judgement;Problem solving;Awareness Current Attention Level: Sustained Following Commands: Follows one step commands with increased time;Follows multi-step commands inconsistently Safety/Judgement: Decreased awareness of safety Awareness: Emergent;Anticipatory Problem Solving: Slow processing;Difficulty sequencing;Requires verbal cues;Requires tactile cues;Decreased initiation General Comments: pt alert today, however with very minimal verbalizations.  pt will answer one question verbally, but then simply stare at PT  when asked a second question.      Mobility  Bed Mobility General bed mobility comments: pt. able to roll l/r with mod inst. cues for hand placement and min/mod a to initiate roll.  rolled several times to change chuck  pads, and linen due to incontinence of urine              End of Session OT - End of Session Activity Tolerance: Patient tolerated treatment well Patient left: in bed;with call bell/phone within reach;with bed alarm set REVIEWED WITH RN WOUND IN LEFT GROIN THAT WAS OPEN WITH VISIBLE BLEEDING.   Pt. Also presented with severe odor and need for bathing upon arrival.  Assisted with full bath but informed rn pt. May need additional cleaning to ensure health and cleanliness for pt., also washing hair? All linen, clothing changed after bath from this therapist asst.        Janice Coffin, COTA/L 03/01/2014, 12:44 PM

## 2014-03-01 NOTE — Progress Notes (Signed)
UR complete.  Aubriee Szeto RN, MSN 

## 2014-03-01 NOTE — Social Work (Signed)
I continue to hit roadblocks with SNF's and family regarding patients departure today- each facility that has offered has ended up rescinding in the end. Patient's dc is currently on hold awaiting further determination by SNF in [place]- will advise-  Eduard Clos, MSW, [place]

## 2014-03-02 LAB — GLUCOSE, CAPILLARY
GLUCOSE-CAPILLARY: 216 mg/dL — AB (ref 70–99)
Glucose-Capillary: 110 mg/dL — ABNORMAL HIGH (ref 70–99)
Glucose-Capillary: 201 mg/dL — ABNORMAL HIGH (ref 70–99)

## 2014-03-02 NOTE — Progress Notes (Signed)
Speech Language Pathology Treatment: Dysphagia;Cognitive-Linquistic  Patient Details Name: [name] MRN: [id number] DOB: 1966/03/14 Today's Date: 03/02/2014 Time: [id number] SLP Time Calculation (min): 34 min  Assessment / Plan / Recommendation Clinical Impression  Pt fully alert today, sitting upright and verbalizing.  Pt with good intake of pureed meals and is afebrile.  Observed pt with advanced textures including softened cracker with applesauce and water.  Effective mastication noted with soft foods with only mild right sulci residuals without pt awareness.   Facilitated airway protection by instructing pt to clear oral cavity which was effective.  Recommend advance diet to soft/ground/thin with intermittent supervision for airway protection.    Pt with delayed processing to information at times not responding for approximately 45 seconds.  Advised pt to use nonverbal cue to inform communication partner that she heard information and is processing.   Pt answered 4/5 yes/no questions correctly with mod I.  She followed two step commands but with intermittent significant delay.  Pt states that her children report she "goes off into space" when they are talking to her.  SLP will add goal for nonverbal cue to facilitate social communication- pt agreeable.    Pt will benefit from continued SLP to maximize functional cognitive linguistic and swallowing abilities.  She is making significant gains during this hospital stay.      HPI HPI: 48 year old female admitted 02/20/14 due to sudden onset of aphasia.  PMH significant for multiple CVAs, MI, HTN, COPD, GERD, DM, B AKA.  MRI showed Moderate to slightly large size acute infarct centered at the left peri operculum region.  Pt has been followed for speech and swallow abilities, she has been lethargic during admit but is improving.    Pertinent Vitals Afebrile, decreased  SLP Plan  Continue with current plan of care    Recommendations Diet  recommendations: Dysphagia 3 (mechanical soft);Thin liquid Liquids provided via: Straw;Cup Medication Administration: Whole meds with puree Supervision: Patient able to self feed;Intermittent supervision to cue for compensatory strategies Compensations: Slow rate;Small sips/bites;Check for pocketing Postural Changes and/or Swallow Maneuvers: Seated upright 90 degrees;Upright 30-60 min after meal              Oral Care Recommendations: Oral care BID Follow up Recommendations: 24 hour supervision/assistance Plan: Continue with current plan of care    [place], [place], [facility] SLP [phone]

## 2014-03-02 NOTE — Progress Notes (Signed)
Physical Therapy Treatment Patient Details Name: [name] MRN: [id number] DOB: [birth year] Today's Date: 03/02/2014 Time: [id number] PT Time Calculation (min): 26 min  PT Assessment / Plan / Recommendation  History of Present Illness [name] is a 48 y.o. female with a history of previous infarcts who presents with sudden onset aphasia earlier today. She was brought to the ED in [place] where a CT was performed which showed(per report) a new stroke that was not apparent on a CT done 12 days earlier. She was therefore not a tpa candidate and was transferred to [facility] for further evaluation. Here, she was found to be aphasic and a CTA was obtained suggesting left parietal branch M2 occlusion and therefore she was felt to be a possible intervention candidate.    PT Comments   Patient continues to make steady progress with therapy today. Able to sit up in bed without assistance for support and able to transfer to recliner and assist with R UE. Patient more alert and able to communicate well with staff today. Continue to recommend SNF for ongoing Physical Therapy.     Follow Up Recommendations  SNF     Does the patient have the potential to tolerate intense rehabilitation     Barriers to Discharge        Equipment Recommendations       Recommendations for Other Services    Frequency Min 2X/week   Progress towards PT Goals Progress towards PT goals: Progressing toward goals  Plan Current plan remains appropriate    Precautions / Restrictions Precautions Precautions: Fall   Pertinent Vitals/Pain no apparent distress    Mobility  Bed Mobility Overal bed mobility: Needs Assistance Rolling: Min assist Supine to sit: Mod assist General bed mobility comments: Patient able to come into long sit with Mod A pulling up with R hand. patient able to use R hand to assist with turning on bed for posterior transfer Transfers Overall transfer level: Needs assistance Anterior-Posterior  transfers: +2 physical assistance;Max assist General transfer comment: A with use of chuck pad to posteriorly transfer to recliner and postioning thereafter. Patient able to scoot and use R UE this session to assist    Exercises     PT Diagnosis:    PT Problem List:   PT Treatment Interventions:     PT Goals (current goals can now be found in the care plan section)    Visit Information  Last PT Received On: 03/02/14 Assistance Needed: +1 History of Present Illness: [name] is a 48 y.o. female with a history of previous infarcts who presents with sudden onset aphasia earlier today. She was brought to the ED in [place] where a CT was performed which showed(per report) a new stroke that was not apparent on a CT done 12 days earlier. She was therefore not a tpa candidate and was transferred to [facility] for further evaluation. Here, she was found to be aphasic and a CTA was obtained suggesting left parietal branch M2 occlusion and therefore she was felt to be a possible intervention candidate.     Subjective Data      Cognition  Cognition Arousal/Alertness: Awake/alert Behavior During Therapy: WFL for tasks assessed/performed Overall Cognitive Status: Impaired/Different from baseline Area of Impairment: Memory;Safety/judgement Current Attention Level: Sustained Following Commands: Follows one step commands with increased time;Follows multi-step commands inconsistently Safety/Judgement: Decreased awareness of safety General Comments: Patient more alert again today. Able to converse with staff well today.     Balance  Balance Sitting balance-Leahy  Scale: Fair Sitting balance - Comments: Patient able to sit on bed ~10 min without no LOB and very steady  End of Session PT - End of Session Activity Tolerance: Patient tolerated treatment well Patient left: in chair;with call bell/phone within reach Nurse Communication: Mobility status   GP     Jacqualyn Posey 03/02/2014,  2:36 PM 03/02/2014 Jacqualyn Posey PTA [phone] pager [phone] office

## 2014-03-02 NOTE — Social Work (Signed)
SNF bed offers continue to be unreliable due to concerns related to her Medicare days/coverage left and 2 open accident claims- nothing has been resolved and I have communicated this with the daughter- [name] who states she is planning to consider taking her home this evening if we do not get things arranged- I am continuing to work  cleared out with the concerns related to her Medicare days and if they are cleared I believe [facility] may be able to consider her tomorrow- updated RNCM.      Eduard Clos, MSW, [place]

## 2014-03-02 NOTE — Progress Notes (Signed)
CSW met with pt, her daughter, and cousin prior to pt d/c.  Pt agreeable to returning home with family assisting her with her care.  Pt's daughter explained that she had spoken with Loma Sousa RNCM re: pt's [place] arrangements.  Pt has [facility] number and the number for Eduard Clos, CSW in case questions arise.

## 2014-03-02 NOTE — Progress Notes (Signed)
Results for AMANNDA, KOOB (MRN [id number]) as of 03/02/2014 11:27  Ref. Range 03/01/2014 06:38 03/01/2014 11:34 03/01/2014 16:23 03/01/2014 21:12 03/02/2014 06:31  Glucose-Capillary Latest Range: 70-99 mg/dL 171 (H) 199 (H) 187 (H) 252 (H) 110 (H)   Postprandial blood sugars greater than 180 mg/dl.  May need to add Novolog 4 uiits TID with meals if postprandials continue to be elevated.  Continue Lantus and Novolog correction scale as ordered.  Harvel Ricks RN BSN CDE

## 2014-03-02 NOTE — Plan of Care (Signed)
Pt cleaned up and disposable gown placed on pt for d/c. Waiting for daughter to come and pick pt up.

## 2014-03-02 NOTE — Progress Notes (Signed)
Stroke Team Progress Note  HISTORY [name] is a 48 y.o. female with a history of previous infarcts who presents with sudden onset aphasia on 02/20/2014 at 1400. She was brought to the ED in [place] where a CT was performed which showed(per report) a new stroke that was not apparent on a CT done 12 days earlier. She was therefore not a tpa candidate and was transferred to [facility] 02/20/2014 1708 for further evaluation. Here, she was found to be aphasic and a CTA was obtained suggesting left parietal branch M2 occlusion and therefore she was felt to be a possibleintervention candidate.  She has a history of multiple strokes with left sided weakness. Patient was not administerd TPA . She was admitted for further evaluation and treatment.  SUBJECTIVE No family at bedside. No new complaints or comments.  OBJECTIVE Most recent Vital Signs: Filed Vitals:   03/01/14 2113 03/02/14 0134 03/02/14 0442 03/02/14 0850  BP: 106/73 105/68 116/78 100/66  Pulse: 81 80 91 79  Temp: 98.8 F (37.1 C) 97.1 F (36.2 C) 98.5 F (36.9 C) 97.3 F (36.3 C)  TempSrc: Oral Oral Oral Oral  Resp: 18 18 20 20   Height:      Weight:      SpO2: 94% 99% 95% 95%   CBG (last 3)   Recent Labs  03/01/14 1623 03/01/14 2112 03/02/14 0631  GLUCAP 187* 252* 110*    IV Fluid Intake:      MEDICATIONS  . aspirin EC  81 mg Oral Daily  . atorvastatin  20 mg Oral q1800  . carvedilol  3.125 mg Oral BID WC  . clopidogrel  75 mg Oral Q breakfast  . digoxin  0.125 mg Oral Q M,W,F  . DULoxetine  60 mg Oral BID  . enoxaparin (LOVENOX) injection  40 mg Subcutaneous Q24H  . fentaNYL  50 mcg Transdermal Q72H  . influenza vac split quadrivalent PF  0.5 mL Intramuscular Tomorrow-1000  . insulin aspart  0-9 Units Subcutaneous TID WC  . insulin detemir  30 Units Subcutaneous QHS  . isosorbide mononitrate  15 mg Oral Daily  . magnesium oxide  400 mg Oral Daily  . midodrine  5 mg Oral TID WC  . nicotine  21 mg Transdermal Q24H   . pantoprazole  40 mg Oral QHS  . pregabalin  50 mg Oral TID   PRN:    Diet:  Dysphagia 2 thin liquids Activity:  OOB with assistance DVT Prophylaxis:  Lovenox 40 mg sq daily   CLINICALLY SIGNIFICANT STUDIES Basic Metabolic Panel:   Recent Labs Lab 02/27/14 1010  NA 139  K 4.4  CL 102  CO2 26  GLUCOSE 71  BUN 7  CREATININE 0.32*  CALCIUM 9.2   CBC:  No results found for this basename: WBC, NEUTROABS, HGB, HCT, MCV, PLT,  in the last 168 hours Coagulation:  No results found for this basename: LABPROT, INR,  in the last 168 hours  Physical Exam   Frail middle aged lady not in distress. Has bilateral above knee amputations.Awake alert. Afebrile. Head is nontraumatic. Neck is supple without bruit. Hearing is normal. Cardiac exam no murmur or gallop. Lungs are clear to auscultation. Distal pulses are not felt in lower extremities.. Neurological Exam : Awake alert. Expressive greater than receptive aphasia. She speaks short sentences only. Follows simple midline and one-step commands only. Left gaze preference but can look to the right past midline. Blinks to threat on the right but not somnolent. Left lower  facial weakness. Tongue midline. Fundi not visualized. Left approximating 3/5 weakness with spasticity and increased tone. Mild left hip flexor weakness. Normal strength on the right and able to move right upper extremity and right thigh against   gravity. Plantars cannot be checked. Sensation is intact. Gait cannot be tested.  ASSESSMENT Ms. [name] is a 48 y.o. female presenting with sudden onset expressive aphasia. Imaging confirms an old right MCA infarct and a new left MCA branch insular infarct in this vasculopath. 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 MRI does confirm new left brain moderate size MCA thronmbotic infarct with cytotoxic mild cerebral edema.. On aspirin 81 mg orally every day and  clopidogrel 75 mg orally every day prior to admission. Now on aspirin 81 mg orally every day and clopidogrel 75 mg orally every day for secondary stroke prevention. Patient with resultant global aphasia. Work up completed.   Hypertension  Diabetes, HgbA1c 8.2, goal < 7.0  LDL 92  COPD  Obesity, Body mass index is 34.86 kg/(m^2).   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   Continue to Monitor blood pressure  Medically ready for discharge when bed available. SW following.   Burnetta Sabin, MSN, RN, ANVP-BC, ANP-BC, [facility] Pager: [phone] 03/02/2014 10:07 AM  I have personally obtained a history, examined the patient, evaluated imaging results, and formulated the assessment and plan of care. I agree with the above. Antony Contras, MD

## 2014-03-09 ENCOUNTER — Telehealth: Payer: Self-pay | Admitting: Neurology

## 2014-03-09 NOTE — Telephone Encounter (Signed)
Legrand Como from [facility] calling to state that mother of patient refused home health physical therapy. If questions, please call [facility]

## 2014-03-09 NOTE — Telephone Encounter (Signed)
Okay 

## 2014-03-09 NOTE — Telephone Encounter (Signed)
Legrand Como from [facility] calling to state that the mother of the patient refused home health physical therapy for the patient. If any questions, please call [facility]

## 2014-08-04 NOTE — Telephone Encounter (Signed)
Noted  

## 2015-12-15 ENCOUNTER — Encounter: Payer: Self-pay | Admitting: *Deleted

## 2015-12-15 ENCOUNTER — Emergency Department: Payer: Medicare Other

## 2015-12-15 ENCOUNTER — Emergency Department
Admission: EM | Admit: 2015-12-15 | Discharge: 2015-12-16 | Disposition: A | Discharge status: Home / Self Care | Payer: Medicare Other | Attending: Emergency Medicine | Admitting: Emergency Medicine

## 2015-12-15 DIAGNOSIS — R2241 Localized swelling, mass and lump, right lower limb: Secondary | ICD-10-CM | POA: Diagnosis not present

## 2015-12-15 DIAGNOSIS — Z79891 Long term (current) use of opiate analgesic: Secondary | ICD-10-CM | POA: Insufficient documentation

## 2015-12-15 DIAGNOSIS — M79604 Pain in right leg: Secondary | ICD-10-CM | POA: Insufficient documentation

## 2015-12-15 DIAGNOSIS — Z89612 Acquired absence of left leg above knee: Secondary | ICD-10-CM | POA: Insufficient documentation

## 2015-12-15 DIAGNOSIS — R300 Dysuria: Secondary | ICD-10-CM | POA: Insufficient documentation

## 2015-12-15 DIAGNOSIS — I10 Essential (primary) hypertension: Secondary | ICD-10-CM | POA: Insufficient documentation

## 2015-12-15 DIAGNOSIS — F17210 Nicotine dependence, cigarettes, uncomplicated: Secondary | ICD-10-CM | POA: Diagnosis not present

## 2015-12-15 DIAGNOSIS — G8929 Other chronic pain: Secondary | ICD-10-CM | POA: Insufficient documentation

## 2015-12-15 DIAGNOSIS — Z794 Long term (current) use of insulin: Secondary | ICD-10-CM | POA: Insufficient documentation

## 2015-12-15 DIAGNOSIS — R109 Unspecified abdominal pain: Secondary | ICD-10-CM | POA: Insufficient documentation

## 2015-12-15 DIAGNOSIS — Z89611 Acquired absence of right leg above knee: Secondary | ICD-10-CM | POA: Diagnosis not present

## 2015-12-15 DIAGNOSIS — E119 Type 2 diabetes mellitus without complications: Secondary | ICD-10-CM | POA: Insufficient documentation

## 2015-12-15 DIAGNOSIS — Z7982 Long term (current) use of aspirin: Secondary | ICD-10-CM | POA: Insufficient documentation

## 2015-12-15 DIAGNOSIS — Z7902 Long term (current) use of antithrombotics/antiplatelets: Secondary | ICD-10-CM | POA: Insufficient documentation

## 2015-12-15 DIAGNOSIS — Z79899 Other long term (current) drug therapy: Secondary | ICD-10-CM | POA: Insufficient documentation

## 2015-12-15 LAB — BASIC METABOLIC PANEL
Anion gap: 8 (ref 5–15)
BUN: 7 mg/dL (ref 6–20)
CO2: 26 mmol/L (ref 22–32)
Calcium: 9 mg/dL (ref 8.9–10.3)
Chloride: 103 mmol/L (ref 101–111)
Creatinine, Ser: <0.3 mg/dL — ABNORMAL LOW (ref 0.44–1.00)
GLUCOSE: 169 mg/dL — AB (ref 65–99)
Potassium: 3.4 mmol/L — ABNORMAL LOW (ref 3.5–5.1)
SODIUM: 137 mmol/L (ref 135–145)

## 2015-12-15 LAB — CBC WITH DIFFERENTIAL/PLATELET
Basophils Absolute: 0.1 10*3/uL (ref 0–0.1)
Basophils Relative: 1 %
Eosinophils Absolute: 0 10*3/uL (ref 0–0.7)
Eosinophils Relative: 0 %
HCT: 44.5 % (ref 35.0–47.0)
HEMOGLOBIN: 15.4 g/dL (ref 12.0–16.0)
Lymphocytes Relative: 31 %
Lymphs Abs: 3.6 10*3/uL (ref 1.0–3.6)
MCH: 33.7 pg (ref 26.0–34.0)
MCHC: 34.6 g/dL (ref 32.0–36.0)
MCV: 97.5 fL (ref 80.0–100.0)
MONO ABS: 0.4 10*3/uL (ref 0.2–0.9)
Monocytes Relative: 3 %
NEUTROS ABS: 7.6 10*3/uL — AB (ref 1.4–6.5)
Neutrophils Relative %: 65 %
Platelets: 185 10*3/uL (ref 150–440)
RBC: 4.56 MIL/uL (ref 3.80–5.20)
RDW: 12.2 % (ref 11.5–14.5)
WBC: 11.7 10*3/uL — ABNORMAL HIGH (ref 3.6–11.0)

## 2015-12-15 LAB — URINALYSIS COMPLETE WITH MICROSCOPIC (ARMC ONLY)
Bilirubin Urine: NEGATIVE
Glucose, UA: NEGATIVE mg/dL
Ketones, ur: NEGATIVE mg/dL
Nitrite: NEGATIVE
Protein, ur: NEGATIVE mg/dL
Specific Gravity, Urine: 1.003 — ABNORMAL LOW (ref 1.005–1.030)
pH: 7 (ref 5.0–8.0)

## 2015-12-15 MED ORDER — MORPHINE SULFATE (PF) 4 MG/ML IV SOLN
4.0000 mg | Freq: Once | INTRAVENOUS | Status: AC
  Administered 2015-12-15: 4 mg via INTRAVENOUS
  Filled 2015-12-15: qty 1

## 2015-12-15 MED ORDER — ONDANSETRON HCL 4 MG/2ML IJ SOLN
4.0000 mg | Freq: Once | INTRAMUSCULAR | Status: AC
  Administered 2015-12-15: 4 mg via INTRAVENOUS
  Filled 2015-12-15: qty 2

## 2015-12-15 NOTE — ED Notes (Signed)
Pt arrived to ED from home reporting right sided nub swelling x 1 month and right sided abd and flank pain beginning 2.5 hours ago that increases with urination. No warmth norted to area and abd pain does not increase with palpation. No fevers reported by pt.

## 2015-12-15 NOTE — Discharge Instructions (Signed)
You were evaluated for right leg pain and swelling, and although no certain cause was found, you examine evaluation are reassuring.  Return to the emergency department for any worsening condition including any worsening pain, trouble breathing, abdominal pain, fever, or any other symptoms concerning to you.   Chronic Pain Chronic pain can be defined as pain that is off and on and lasts for 3-6 months or longer. Many things cause chronic pain, which can make it difficult to make a diagnosis. There are many treatment options available for chronic pain. However, finding a treatment that works well for you may require trying various approaches until the right one is found. Many people benefit from a combination of two or more types of treatment to control their pain. SYMPTOMS  Chronic pain can occur anywhere in the body and can range from mild to very severe. Some types of chronic pain include:  Headache.  Low back pain.  Cancer pain.  Arthritis pain.  Neurogenic pain. This is pain resulting from damage to nerves. People with chronic pain may also have other symptoms such as:  Depression.  Anger.  Insomnia.  Anxiety. DIAGNOSIS  Your health care provider will help diagnose your condition over time. In many cases, the initial focus will be on excluding possible conditions that could be causing the pain. Depending on your symptoms, your health care provider may order tests to diagnose your condition. Some of these tests may include:   Blood tests.   CT scan.   MRI.   X-rays.   Ultrasounds.   Nerve conduction studies.  You may need to see a specialist.  TREATMENT  Finding treatment that works well may take time. You may be referred to a pain specialist. He or she may prescribe medicine or therapies, such as:   Mindful meditation or yoga.  Shots (injections) of numbing or pain-relieving medicines into the spine or area of pain.  Local electrical  stimulation.  Acupuncture.   Massage therapy.   Aroma, color, light, or sound therapy.   Biofeedback.   Working with a physical therapist to keep from getting stiff.   Regular, gentle exercise.   Cognitive or behavioral therapy.   Group support.  Sometimes, surgery may be recommended.  HOME CARE INSTRUCTIONS   Take all medicines as directed by your health care provider.   Lessen stress in your life by relaxing and doing things such as listening to calming music.   Exercise or be active as directed by your health care provider.   Eat a healthy diet and include things such as vegetables, fruits, fish, and lean meats in your diet.   Keep all follow-up appointments with your health care provider.   Attend a support group with others suffering from chronic pain. SEEK MEDICAL CARE IF:   Your pain gets worse.   You develop a new pain that was not there before.   You cannot tolerate medicines given to you by your health care provider.   You have new symptoms since your last visit with your health care provider.  SEEK IMMEDIATE MEDICAL CARE IF:   You feel weak.   You have decreased sensation or numbness.   You lose control of bowel or bladder function.   Your pain suddenly gets much worse.   You develop shaking.  You develop chills.  You develop confusion.  You develop chest pain.  You develop shortness of breath.  MAKE SURE YOU:  Understand these instructions.  Will watch your condition.  Will  get help right away if you are not doing well or get worse.   This information is not intended to replace advice given to you by your health care provider. Make sure you discuss any questions you have with your health care provider.   Document Released: 09/07/2002 Document Revised: 08/18/2013 Document Reviewed: 06/11/2013 Elsevier Interactive Patient Education [employer].

## 2015-12-15 NOTE — ED Provider Notes (Signed)
[facility] Emergency Department Provider Note   ____________________________________________  Time seen:  I have reviewed the triage vital signs and the triage nursing note.  HISTORY  Chief Complaint Leg Swelling and Flank Pain   Historian Patient  HPI [name] is a 49 y.o. female with a history of terrible vascular disease including bilateral AKA and a history of vascular stenting per the patient, is here for evaluation of right thigh pain and swelling of the stump for approximately one month. She also has chronic right-sided abdominal pain, and states that she came tonight because she was not getting relief from herpain medications at home. She's not had a fever. She denies any cough or trouble breathing or chest pain. She states that the right-sided abdominal pain is chronic and comes from her "stent ".  She's had no skin rash. She states that she stopped exercising her right lower extremity about a month ago and since then she's had some chronic pain there. Those are moderate. No exacerbating or alleviating factors.    Past Medical History  Diagnosis Date  . Stroke ([place])   . MI (myocardial infarction) ([place])   . Hypertension   . Asthma   . COPD (chronic obstructive pulmonary disease) ([place])   . GERD (gastroesophageal reflux disease)   . Anemia   . Diabetes mellitus without complication ([place])   . Anxiety   . Cigarette smoker 02/25/2014  . Obesity, unspecified 02/25/2014  . Hx of ischemic right MCA stroke 02/25/2014    Patient Active Problem List   Diagnosis Date Noted  . Type II or unspecified type diabetes mellitus with unspecified complication, uncontrolled 02/25/2014  . Unspecified essential hypertension 02/25/2014  . Cigarette smoker 02/25/2014  . Obesity, unspecified 02/25/2014  . Hx of ischemic right MCA stroke, 1998 02/25/2014  . CVA (cerebral infarction) 02/20/2014    Past Surgical History  Procedure Laterality Date  . Above knee  leg amputation Bilateral   . Angioplasty      Current Outpatient Rx  Name  Route  Sig  Dispense  Refill  . acetaminophen (TYLENOL) 650 MG CR tablet   Oral   Take 650 mg by mouth every 6 (six) hours as needed for pain.         Marland Kitchen ALPRAZolam (XANAX) 1 MG tablet   Oral   Take 1 mg by mouth 3 (three) times daily as needed for anxiety.         Marland Kitchen alum & mag hydroxide-simeth (MAALOX PLUS) 400-400-40 MG/5ML suspension   Oral   Take 30 mLs by mouth every 3 (three) hours as needed for indigestion.         Marland Kitchen aspirin EC 81 MG tablet   Oral   Take 81 mg by mouth daily.         . carvedilol (COREG) 3.125 MG tablet   Oral   Take 3.125 mg by mouth 2 (two) times daily with a meal.         . clopidogrel (PLAVIX) 75 MG tablet   Oral   Take 75 mg by mouth daily with breakfast.         . digoxin (LANOXIN) 0.125 MG tablet   Oral   Take 0.125 mg by mouth daily.         Marland Kitchen docusate sodium (COLACE) 100 MG capsule   Oral   Take 100 mg by mouth 2 (two) times daily as needed for mild constipation.         Marland Kitchen  DULoxetine (CYMBALTA) 60 MG capsule   Oral   Take 60 mg by mouth 2 (two) times daily.         . fentaNYL (DURAGESIC - DOSED MCG/HR) 50 MCG/HR   Transdermal   Place 50 mcg onto the skin every 3 (three) days.         . furosemide (LASIX) 20 MG tablet   Oral   Take 20 mg by mouth daily as needed for fluid.         Marland Kitchen insulin detemir (LEVEMIR) 100 UNIT/ML injection   Subcutaneous   Inject 30 Units into the skin at bedtime.         . isosorbide mononitrate (IMDUR) 15 mg TB24 24 hr tablet   Oral   Take 0.5 tablets (15 mg total) by mouth daily.   30 tablet   2   . magnesium hydroxide (MILK OF MAGNESIA) 400 MG/5ML suspension   Oral   Take 30 mLs by mouth daily as needed for mild constipation.         . magnesium oxide (MAG-OX) 400 MG tablet   Oral   Take 400 mg by mouth daily.         . midodrine (PROAMATINE) 5 MG tablet   Oral   Take 5 mg by mouth 3  (three) times daily with meals.         . nicotine (NICODERM CQ - DOSED IN MG/24 HOURS) 21 mg/24hr patch   Transdermal   Place 21 mg onto the skin daily.         . nitroGLYCERIN (NITROSTAT) 0.4 MG SL tablet   Sublingual   Place 0.4 mg under the tongue every 5 (five) minutes as needed for chest pain.         . pantoprazole (PROTONIX) 40 MG tablet   Oral   Take 40 mg by mouth daily.         . pregabalin (LYRICA) 50 MG capsule   Oral   Take 50 mg by mouth 3 (three) times daily.         . ranolazine (RANEXA) 500 MG 12 hr tablet   Oral   Take 500 mg by mouth 2 (two) times daily.         . rosuvastatin (CRESTOR) 10 MG tablet   Oral   Take 10 mg by mouth at bedtime.           Allergies Ace inhibitors; Flexeril; and Paxil  History reviewed. No pertinent family history.  Social History Social History  Substance Use Topics  . Smoking status: Current Every Day Smoker -- 0.50 packs/day    Types: Cigarettes  . Smokeless tobacco: None  . Alcohol Use: No    Review of Systems  Constitutional: Negative for fever. Eyes: Negative for visual changes. ENT: Negative for sore throat. Cardiovascular: Negative for chest pain. Respiratory: Negative for shortness of breath. Gastrointestinal: Negative for vomiting and diarrhea. Genitourinary: Positive for dysuria. Musculoskeletal: Negative for back pain. Skin: Negative for rash. Neurological: Negative for headache. 10 point Review of Systems otherwise negative ____________________________________________   PHYSICAL EXAM:  VITAL SIGNS: ED Triage Vitals  Enc Vitals Group     BP 12/15/15 1752 102/76 mmHg     Pulse Rate 12/15/15 1752 130     Resp 12/15/15 2051 14     Temp 12/15/15 1752 98 F (36.7 C)     Temp Source 12/15/15 1752 Oral     SpO2 12/15/15 1752 97 %  Weight 12/15/15 1752 119 lb 4.3 oz (54.1 kg)     Height 12/15/15 1752 4' (1.219 m)     Head Cir --      Peak Flow --      Pain Score 12/15/15 1754  9     Pain Loc --      Pain Edu? --      Excl. in [place]? --      Constitutional: Alert and oriented. Well appearing and in no distress. Eyes: Conjunctivae are normal. PERRL. Normal extraocular movements. ENT   Head: Normocephalic and atraumatic.   Nose: No congestion/rhinnorhea.   Mouth/Throat: Mucous membranes are moist.   Neck: No stridor. Cardiovascular/Chest: Normal rate, regular rhythm.  No murmurs, rubs, or gallops. Respiratory: Normal respiratory effort without tachypnea nor retractions. Breath sounds are clear and equal bilaterally. No wheezes/rales/rhonchi. Gastrointestinal: Soft. No distention, no guarding, no rebound. Nontender abdomen in 4 quadrants.  Genitourinary/rectal:Deferred Musculoskeletal: Above knee amputations bilaterally. Right stump is slightly larger than left stump, and muscles are slightly tender to palpation, but there is no skin rash, redness, warmth, or induration. Neurologic:  Normal speech and language. No gross or focal neurologic deficits are appreciated. Skin:  Skin is warm, dry and intact. No rash noted. Psychiatric: Mood and affect are normal. Speech and behavior are normal. Patient exhibits appropriate insight and judgment.  ____________________________________________   EKG I, Lisa Roca, MD, the attending physician have personally viewed and interpreted all ECGs.  none ____________________________________________  LABS (pertinent positives/negatives)  Basic metabolic panel without significant abnormality White blood cell count 11.7, hemoglobin 15.4 and platelet count 185 Urinalysis trace leukocytes, rare bacteria and 6-30 squamous epithelial cells Urine culture sent/pending  ____________________________________________  RADIOLOGY All Xrays were viewed by me. Imaging interpreted by Radiologist.  Ultrasound right lower extremity: No evidence of deep venous thrombosis. Right above-the-knee  amputation __________________________________________  PROCEDURES  Procedure(s) performed: None  Critical Care performed: None  ____________________________________________   ED COURSE / ASSESSMENT AND PLAN  CONSULTATIONS: None  Pertinent labs & imaging results that were available during my care of the patient were reviewed by me and considered in my medical decision making (see chart for details).   Patient is not exactly clear with me what drove her to come in for evaluation tonight, and states that both symptoms of the right sided flank pain and the right lower extremity pain have been ongoing now for about a month. Both are waxing and waning. She states the right flank pain is chronic. The right lower extremity has been sore and she was questioning whether or not she could have a blood clot. There is been no skin rash.  The right lower extremity is a little larger than the left lower extremity, and there is no evidence of cellulitis. Ultrasound was performed and there are no evidence of DVT.  On exam her abdomen is soft and nontender palpation.  Patient / Family / Caregiver informed of clinical course, medical decision-making process, and agree with plan.   I discussed return precautions, follow-up instructions, and discharged instructions with patient and/or family.  ___________________________________________   FINAL CLINICAL IMPRESSION(S) / ED DIAGNOSES   Final diagnoses:  Right leg pain  Chronic pain       Lisa Roca, MD 12/15/15 2357

## 2015-12-18 LAB — URINE CULTURE: CULTURE: NO GROWTH

## 2015-12-20 ENCOUNTER — Encounter: Payer: Self-pay | Admitting: Internal Medicine

## 2015-12-20 ENCOUNTER — Inpatient Hospital Stay
Admission: EM | Admit: 2015-12-20 | Discharge: 2015-12-23 | DRG: 690 | Disposition: A | Discharge status: Skilled Nursing Facility | Payer: Medicare Other | Attending: Internal Medicine | Admitting: Internal Medicine

## 2015-12-20 DIAGNOSIS — I959 Hypotension, unspecified: Secondary | ICD-10-CM | POA: Diagnosis present

## 2015-12-20 DIAGNOSIS — Z8673 Personal history of transient ischemic attack (TIA), and cerebral infarction without residual deficits: Secondary | ICD-10-CM

## 2015-12-20 DIAGNOSIS — F17210 Nicotine dependence, cigarettes, uncomplicated: Secondary | ICD-10-CM | POA: Diagnosis present

## 2015-12-20 DIAGNOSIS — K219 Gastro-esophageal reflux disease without esophagitis: Secondary | ICD-10-CM | POA: Diagnosis present

## 2015-12-20 DIAGNOSIS — E119 Type 2 diabetes mellitus without complications: Secondary | ICD-10-CM | POA: Diagnosis present

## 2015-12-20 DIAGNOSIS — E039 Hypothyroidism, unspecified: Secondary | ICD-10-CM | POA: Diagnosis present

## 2015-12-20 DIAGNOSIS — Z794 Long term (current) use of insulin: Secondary | ICD-10-CM

## 2015-12-20 DIAGNOSIS — J449 Chronic obstructive pulmonary disease, unspecified: Secondary | ICD-10-CM | POA: Diagnosis present

## 2015-12-20 DIAGNOSIS — Z89612 Acquired absence of left leg above knee: Secondary | ICD-10-CM | POA: Diagnosis not present

## 2015-12-20 DIAGNOSIS — J45909 Unspecified asthma, uncomplicated: Secondary | ICD-10-CM | POA: Diagnosis present

## 2015-12-20 DIAGNOSIS — Z79899 Other long term (current) drug therapy: Secondary | ICD-10-CM

## 2015-12-20 DIAGNOSIS — Z7982 Long term (current) use of aspirin: Secondary | ICD-10-CM | POA: Diagnosis not present

## 2015-12-20 DIAGNOSIS — Z89611 Acquired absence of right leg above knee: Secondary | ICD-10-CM

## 2015-12-20 DIAGNOSIS — I10 Essential (primary) hypertension: Secondary | ICD-10-CM | POA: Diagnosis present

## 2015-12-20 DIAGNOSIS — D649 Anemia, unspecified: Secondary | ICD-10-CM | POA: Diagnosis present

## 2015-12-20 DIAGNOSIS — I252 Old myocardial infarction: Secondary | ICD-10-CM | POA: Diagnosis not present

## 2015-12-20 DIAGNOSIS — E1169 Type 2 diabetes mellitus with other specified complication: Secondary | ICD-10-CM

## 2015-12-20 DIAGNOSIS — A419 Sepsis, unspecified organism: Secondary | ICD-10-CM | POA: Diagnosis present

## 2015-12-20 DIAGNOSIS — N390 Urinary tract infection, site not specified: Secondary | ICD-10-CM | POA: Diagnosis not present

## 2015-12-20 DIAGNOSIS — I441 Atrioventricular block, second degree: Secondary | ICD-10-CM | POA: Diagnosis present

## 2015-12-20 DIAGNOSIS — Z7901 Long term (current) use of anticoagulants: Secondary | ICD-10-CM

## 2015-12-20 LAB — CBC WITH DIFFERENTIAL/PLATELET
Basophils Absolute: 0.1 10*3/uL (ref 0–0.1)
Basophils Relative: 0 %
Eosinophils Absolute: 0.1 10*3/uL (ref 0–0.7)
Eosinophils Relative: 0 %
HEMATOCRIT: 49.6 % — AB (ref 35.0–47.0)
HEMOGLOBIN: 16.5 g/dL — AB (ref 12.0–16.0)
LYMPHS ABS: 4.7 10*3/uL — AB (ref 1.0–3.6)
MCH: 33 pg (ref 26.0–34.0)
MCHC: 33.3 g/dL (ref 32.0–36.0)
MCV: 99.2 fL (ref 80.0–100.0)
Monocytes Absolute: 0.7 10*3/uL (ref 0.2–0.9)
Neutro Abs: 12.4 10*3/uL — ABNORMAL HIGH (ref 1.4–6.5)
Neutrophils Relative %: 70 %
Platelets: 239 10*3/uL (ref 150–440)
RBC: 5 MIL/uL (ref 3.80–5.20)
RDW: 12.1 % (ref 11.5–14.5)
WBC: 17.9 10*3/uL — ABNORMAL HIGH (ref 3.6–11.0)

## 2015-12-20 LAB — COMPREHENSIVE METABOLIC PANEL
ALT: 16 U/L (ref 14–54)
AST: 22 U/L (ref 15–41)
Albumin: 4.1 g/dL (ref 3.5–5.0)
Alkaline Phosphatase: 93 U/L (ref 38–126)
Anion gap: 11 (ref 5–15)
BUN: 10 mg/dL (ref 6–20)
CHLORIDE: 101 mmol/L (ref 101–111)
CO2: 22 mmol/L (ref 22–32)
Calcium: 9.3 mg/dL (ref 8.9–10.3)
Creatinine, Ser: <0.3 mg/dL — ABNORMAL LOW (ref 0.44–1.00)
Glucose, Bld: 132 mg/dL — ABNORMAL HIGH (ref 65–99)
POTASSIUM: 3.4 mmol/L — AB (ref 3.5–5.1)
Sodium: 134 mmol/L — ABNORMAL LOW (ref 135–145)
Total Bilirubin: 0.9 mg/dL (ref 0.3–1.2)
Total Protein: 7.5 g/dL (ref 6.5–8.1)

## 2015-12-20 LAB — URINALYSIS COMPLETE WITH MICROSCOPIC (ARMC ONLY)
BILIRUBIN URINE: NEGATIVE
Glucose, UA: NEGATIVE mg/dL
KETONES UR: NEGATIVE mg/dL
Nitrite: NEGATIVE
PROTEIN: 30 mg/dL — AB
SPECIFIC GRAVITY, URINE: 1.01 (ref 1.005–1.030)
pH: 6 (ref 5.0–8.0)

## 2015-12-20 LAB — LACTIC ACID, PLASMA: Lactic Acid, Venous: 1.2 mmol/L (ref 0.5–2.0)

## 2015-12-20 LAB — GLUCOSE, CAPILLARY: Glucose-Capillary: 215 mg/dL — ABNORMAL HIGH (ref 65–99)

## 2015-12-20 MED ORDER — ACETAMINOPHEN 650 MG RE SUPP: 650.0000 mg | Freq: Four times a day (QID) | RECTAL | Status: DC | PRN

## 2015-12-20 MED ORDER — LEVOTHYROXINE SODIUM 50 MCG PO TABS
25.0000 ug | ORAL_TABLET | Freq: Every day | ORAL | Status: DC
  Administered 2015-12-21 – 2015-12-23 (×3): 25 ug via ORAL
  Filled 2015-12-20 (×3): qty 1

## 2015-12-20 MED ORDER — DEXTROSE 5 % IV SOLN
1.0000 g | INTRAVENOUS | Status: DC
  Administered 2015-12-21: 1 g via INTRAVENOUS
  Filled 2015-12-20 (×2): qty 10

## 2015-12-20 MED ORDER — MORPHINE SULFATE (PF) 2 MG/ML IV SOLN
2.0000 mg | Freq: Once | INTRAVENOUS | Status: AC
  Administered 2015-12-20: 2 mg via INTRAVENOUS
  Filled 2015-12-20: qty 1

## 2015-12-20 MED ORDER — INSULIN ASPART 100 UNIT/ML ~~LOC~~ SOLN
0.0000 [IU] | Freq: Every day | SUBCUTANEOUS | Status: DC
  Administered 2015-12-20: 3 [IU] via SUBCUTANEOUS
  Filled 2015-12-20: qty 2

## 2015-12-20 MED ORDER — DULOXETINE HCL 30 MG PO CPEP
60.0000 mg | ORAL_CAPSULE | Freq: Two times a day (BID) | ORAL | Status: DC
  Administered 2015-12-21 – 2015-12-23 (×5): 60 mg via ORAL
  Filled 2015-12-20 (×6): qty 2

## 2015-12-20 MED ORDER — ONDANSETRON HCL 4 MG/2ML IJ SOLN
4.0000 mg | Freq: Four times a day (QID) | INTRAMUSCULAR | Status: DC | PRN
  Administered 2015-12-22: 4 mg via INTRAVENOUS
  Filled 2015-12-20: qty 2

## 2015-12-20 MED ORDER — SODIUM CHLORIDE 0.9 % IV SOLN
INTRAVENOUS | Status: AC
  Administered 2015-12-20 – 2015-12-21 (×2): via INTRAVENOUS

## 2015-12-20 MED ORDER — ONDANSETRON HCL 4 MG PO TABS: 4.0000 mg | ORAL_TABLET | Freq: Four times a day (QID) | ORAL | Status: DC | PRN

## 2015-12-20 MED ORDER — DIGOXIN 125 MCG PO TABS
0.1250 mg | ORAL_TABLET | ORAL | Status: DC
  Administered 2015-12-22: 0.125 mg via ORAL
  Filled 2015-12-20: qty 1

## 2015-12-20 MED ORDER — SODIUM CHLORIDE 0.9 % IJ SOLN
3.0000 mL | Freq: Two times a day (BID) | INTRAMUSCULAR | Status: DC
  Administered 2015-12-21 – 2015-12-23 (×4): 3 mL via INTRAVENOUS

## 2015-12-20 MED ORDER — INSULIN ASPART 100 UNIT/ML ~~LOC~~ SOLN
0.0000 [IU] | Freq: Three times a day (TID) | SUBCUTANEOUS | Status: DC
  Administered 2015-12-21 (×2): 2 [IU] via SUBCUTANEOUS
  Administered 2015-12-21 – 2015-12-23 (×4): 1 [IU] via SUBCUTANEOUS
  Filled 2015-12-20 (×3): qty 1
  Filled 2015-12-20 (×2): qty 2
  Filled 2015-12-20: qty 1

## 2015-12-20 MED ORDER — DEXTROSE 5 % IV SOLN
1.0000 g | Freq: Once | INTRAVENOUS | Status: AC
  Administered 2015-12-20: 1 g via INTRAVENOUS
  Filled 2015-12-20: qty 10

## 2015-12-20 MED ORDER — SODIUM CHLORIDE 0.9 % IV BOLUS (SEPSIS)
1000.0000 mL | Freq: Once | INTRAVENOUS | Status: AC
  Administered 2015-12-20: 1000 mL via INTRAVENOUS

## 2015-12-20 MED ORDER — APIXABAN 5 MG PO TABS
5.0000 mg | ORAL_TABLET | Freq: Two times a day (BID) | ORAL | Status: DC
  Administered 2015-12-21 – 2015-12-23 (×5): 5 mg via ORAL
  Filled 2015-12-20 (×6): qty 1

## 2015-12-20 MED ORDER — ROSUVASTATIN CALCIUM 20 MG PO TABS
20.0000 mg | ORAL_TABLET | Freq: Every day | ORAL | Status: DC
  Administered 2015-12-21 – 2015-12-23 (×3): 20 mg via ORAL
  Filled 2015-12-20 (×3): qty 1

## 2015-12-20 MED ORDER — ACETAMINOPHEN 325 MG PO TABS: 650.0000 mg | ORAL_TABLET | Freq: Four times a day (QID) | ORAL | Status: DC | PRN

## 2015-12-20 MED ORDER — ISOSORBIDE MONONITRATE ER 60 MG PO TB24
60.0000 mg | ORAL_TABLET | Freq: Every day | ORAL | Status: DC
Start: 2015-12-21 — End: 2015-12-21
  Administered 2015-12-21: 60 mg via ORAL
  Filled 2015-12-20: qty 1

## 2015-12-20 MED ORDER — OXYCODONE HCL 5 MG PO TABS
5.0000 mg | ORAL_TABLET | Freq: Four times a day (QID) | ORAL | Status: DC | PRN
  Administered 2015-12-20 – 2015-12-21 (×5): 5 mg via ORAL
  Filled 2015-12-20 (×5): qty 1

## 2015-12-20 MED ORDER — IPRATROPIUM-ALBUTEROL 0.5-2.5 (3) MG/3ML IN SOLN: 3.0000 mL | RESPIRATORY_TRACT | Status: DC | PRN

## 2015-12-20 NOTE — ED Provider Notes (Addendum)
[facility] Emergency Department Provider Note  Time seen: 5:46 PM  I have reviewed the triage vital signs and the nursing notes.   HISTORY  Chief Complaint Anxiety    HPI [name] is a 49 y.o. female for the past medical history of CVA affecting her left side, MI, hypertension, COPD, gastric reflux, diabetes, bilateral AKA, who presents the emergency department as she is unable to adequately care for self. According to the patient she lives with a couple who she pays to care for her however she states the husband was recently jailed last week and the wife is gone most of the day. Today the patient called EMS as she had urinated and had a bowel movement, no weight to change herself, had no access to water, food, or medications. Patient has no medical complaints tonight but signs not being able to care for herself and not having adequate care at home.    Past Medical History  Diagnosis Date  . Stroke ([place])   . MI (myocardial infarction) ([place])   . Hypertension   . Asthma   . COPD (chronic obstructive pulmonary disease) ([place])   . GERD (gastroesophageal reflux disease)   . Anemia   . Diabetes mellitus without complication ([place])   . Anxiety   . Cigarette smoker 02/25/2014  . Obesity, unspecified 02/25/2014  . Hx of ischemic right MCA stroke 02/25/2014    Patient Active Problem List   Diagnosis Date Noted  . Type II or unspecified type diabetes mellitus with unspecified complication, uncontrolled 02/25/2014  . Unspecified essential hypertension 02/25/2014  . Cigarette smoker 02/25/2014  . Obesity, unspecified 02/25/2014  . Hx of ischemic right MCA stroke, 1998 02/25/2014  . CVA (cerebral infarction) 02/20/2014    Past Surgical History  Procedure Laterality Date  . Above knee leg amputation Bilateral   . Angioplasty      Current Outpatient Rx  Name  Route  Sig  Dispense  Refill  . acetaminophen (TYLENOL) 650 MG CR tablet   Oral   Take 650 mg by  mouth every 6 (six) hours as needed for pain.         Marland Kitchen ALPRAZolam (XANAX) 1 MG tablet   Oral   Take 1 mg by mouth 3 (three) times daily as needed for anxiety.         Marland Kitchen alum & mag hydroxide-simeth (MAALOX PLUS) 400-400-40 MG/5ML suspension   Oral   Take 30 mLs by mouth every 3 (three) hours as needed for indigestion.         Marland Kitchen aspirin EC 81 MG tablet   Oral   Take 81 mg by mouth daily.         . carvedilol (COREG) 3.125 MG tablet   Oral   Take 3.125 mg by mouth 2 (two) times daily with a meal.         . clopidogrel (PLAVIX) 75 MG tablet   Oral   Take 75 mg by mouth daily with breakfast.         . digoxin (LANOXIN) 0.125 MG tablet   Oral   Take 0.125 mg by mouth daily.         Marland Kitchen docusate sodium (COLACE) 100 MG capsule   Oral   Take 100 mg by mouth 2 (two) times daily as needed for mild constipation.         . DULoxetine (CYMBALTA) 60 MG capsule   Oral   Take 60 mg by mouth 2 (two)  times daily.         . fentaNYL (DURAGESIC - DOSED MCG/HR) 50 MCG/HR   Transdermal   Place 50 mcg onto the skin every 3 (three) days.         . furosemide (LASIX) 20 MG tablet   Oral   Take 20 mg by mouth daily as needed for fluid.         Marland Kitchen insulin detemir (LEVEMIR) 100 UNIT/ML injection   Subcutaneous   Inject 30 Units into the skin at bedtime.         . isosorbide mononitrate (IMDUR) 15 mg TB24 24 hr tablet   Oral   Take 0.5 tablets (15 mg total) by mouth daily.   30 tablet   2   . magnesium hydroxide (MILK OF MAGNESIA) 400 MG/5ML suspension   Oral   Take 30 mLs by mouth daily as needed for mild constipation.         . magnesium oxide (MAG-OX) 400 MG tablet   Oral   Take 400 mg by mouth daily.         . midodrine (PROAMATINE) 5 MG tablet   Oral   Take 5 mg by mouth 3 (three) times daily with meals.         . nicotine (NICODERM CQ - DOSED IN MG/24 HOURS) 21 mg/24hr patch   Transdermal   Place 21 mg onto the skin daily.         .  nitroGLYCERIN (NITROSTAT) 0.4 MG SL tablet   Sublingual   Place 0.4 mg under the tongue every 5 (five) minutes as needed for chest pain.         . pantoprazole (PROTONIX) 40 MG tablet   Oral   Take 40 mg by mouth daily.         . pregabalin (LYRICA) 50 MG capsule   Oral   Take 50 mg by mouth 3 (three) times daily.         . ranolazine (RANEXA) 500 MG 12 hr tablet   Oral   Take 500 mg by mouth 2 (two) times daily.         . rosuvastatin (CRESTOR) 10 MG tablet   Oral   Take 10 mg by mouth at bedtime.           Allergies Ace inhibitors; Flexeril; and Paxil  No family history on file.  Social History Social History  Substance Use Topics  . Smoking status: Current Every Day Smoker -- 0.50 packs/day    Types: Cigarettes  . Smokeless tobacco: Not on file  . Alcohol Use: No    Review of Systems Constitutional: Negative for fever. Cardiovascular: Negative for chest pain. Respiratory: Negative for shortness of breath. Gastrointestinal: Negative for abdominal pain Musculoskeletal: Bilateral AKA Neurological: Negative for headache 10-point ROS otherwise negative.  ____________________________________________   PHYSICAL EXAM:  VITAL SIGNS: ED Triage Vitals  Enc Vitals Group     BP 12/20/15 1704 101/76 mmHg     Pulse Rate 12/20/15 1704 133     Resp --      Temp 12/20/15 1704 98.8 F (37.1 C)     Temp Source 12/20/15 1704 Oral     SpO2 12/20/15 1704 96 %     Weight 12/20/15 1704 119 lb (53.978 kg)     Height --      Head Cir --      Peak Flow --      Pain Score 12/20/15 1734 10  Pain Loc --      Pain Edu? --      Excl. in [place]? --     Constitutional: Alert and oriented. Well appearing and in no distress. Eyes: Normal exam ENT   Head: Normocephalic and atraumatic.   Mouth/Throat: Mucous membranes are moist. Cardiovascular: Normal rate, regular rhythm. No murmur Respiratory: Normal respiratory effort without tachypnea nor retractions. Breath  sounds are clear  Gastrointestinal: Soft and nontender. No distention.   Musculoskeletal: bilateral AKA. Left-sided deficit Neurologic:  Normal speech and lanCVA affecting the patient's left side skin:  Skin is warm, dry and intact.  Psychiatric: Mood and affect are normal. Speech and behavior are normal.   ____________________________________________    EKG EKG reviewed and interpreted by myself shows sinus tachycardia 131 bpm, narrow QRS, normal axis, normal interval, besides a prolonged QTC. Nonspecific ST changes.  ____________________________________________    INITIAL IMPRESSION / ASSESSMENT AND PLAN / ED COURSE  Pertinent labs & imaging results that were available during my care of the patient were reviewed by me and considered in my medical decision making (see chart for details).  Patient presents to the emergency department being unable to adequately care for herself, and in the living situation which is inappropriate for her condition. We'll check basic labs and monitor in the emergency department. I discussed the patient with social work however they are unable to do much tonight as it is after 5 PM, they state tomorrow they would be able to contact social services, [employer] protection services, and workup and possible home health, etc. we will watch the patient in the emergency department, if she does not meet admission criteria she'll be bordered overnight until social worker can manage in the morning.  Patient's labs showed an elevated white blood cell count of 17, patient remains tachycardic at 110 bpm, with a urinalysis showing 2 numerous to count red and white blood cells. I discussed the patient with the hospitalist. We'll admit the patient to the hospital for IV antibiotics as she meets sepsis criteria, and have social work see the patient in the hospital. Patient states her normal blood pressure 90/60.  ____________________________________________   FINAL CLINICAL  IMPRESSION(S) / ED DIAGNOSES  Sepsis Urinary tract infection   Harvest Dark, MD 12/20/15 [place], MD 12/20/15 [id number]  Harvest Dark, MD 12/20/15 2055

## 2015-12-20 NOTE — H&P (Signed)
[facility] [place] NAME: [name]    MR#:  [id number]  DATE OF BIRTH:  29-Mar-1966  DATE OF ADMISSION:  12/20/2015  PRIMARY CARE PHYSICIAN: Pcp Not In System   REQUESTING/REFERRING PHYSICIAN: Kerman Passey, MD  CHIEF COMPLAINT:   Chief Complaint  Patient presents with  . Anxiety    HISTORY OF PRESENT ILLNESS:  [name]  is a 49 y.o. female who presents with anxiety, difficulty caring herself. Patient has significant medical history, as below, with bilateral AKA. She recently has had difficulty caring for herself, one of her primary caregivers has been jailed, and the other one has been unable to care for her at night for stretches of 8-12 hours at a time. Patient called EMS to be brought in and evaluated today. Here in the ED she was found to have an elevated white blood cell count, she was tachycardic. Workup revealed likely urinary source for her sepsis. Patient has no outright urinary symptoms, though she has had some nausea and malaise. Hospitalists were called for admission for the same.  PAST MEDICAL HISTORY:   Past Medical History  Diagnosis Date  . Stroke ([place])   . MI (myocardial infarction) ([place])   . Hypertension   . Asthma   . COPD (chronic obstructive pulmonary disease) ([place])   . GERD (gastroesophageal reflux disease)   . Anemia   . Diabetes mellitus without complication ([place])   . Anxiety   . Cigarette smoker 02/25/2014  . Obesity, unspecified 02/25/2014  . Hx of ischemic right MCA stroke 02/25/2014    PAST SURGICAL HISTORY:   Past Surgical History  Procedure Laterality Date  . Above knee leg amputation Bilateral   . Angioplasty      SOCIAL HISTORY:   Social History  Substance Use Topics  . Smoking status: Current Every Day Smoker -- 0.50 packs/day    Types: Cigarettes  . Smokeless tobacco: Not on file  . Alcohol Use: No    FAMILY HISTORY:   Family History  Problem Relation Age of Onset   . Vascular Disease      DRUG ALLERGIES:   Allergies  Allergen Reactions  . Ace Inhibitors   . Flexeril [Cyclobenzaprine]   . Paxil [Paroxetine Hcl]     MEDICATIONS AT HOME:   Prior to Admission medications   Medication Sig Start Date End Date Taking? Authorizing Provider  apixaban (ELIQUIS) 5 MG TABS tablet Take 5 mg by mouth 2 (two) times daily.   Yes Historical Provider, MD  digoxin (LANOXIN) 0.125 MG tablet Take 0.125 mg by mouth 3 (three) times a week.    Yes Historical Provider, MD  DULoxetine (CYMBALTA) 60 MG capsule Take 60 mg by mouth 2 (two) times daily.   Yes Historical Provider, MD  gabapentin (NEURONTIN) 300 MG capsule Take 300 mg by mouth 3 (three) times daily.   Yes Historical Provider, MD  isosorbide mononitrate (IMDUR) 60 MG 24 hr tablet Take 60 mg by mouth daily.   Yes Historical Provider, MD  levothyroxine (SYNTHROID, LEVOTHROID) 25 MCG tablet Take 25 mcg by mouth daily before breakfast.   Yes Historical Provider, MD  lubiprostone (AMITIZA) 24 MCG capsule Take 24 mcg by mouth 2 (two) times daily with a meal.   Yes Historical Provider, MD  nitroGLYCERIN (NITROSTAT) 0.4 MG SL tablet Place 0.4 mg under the tongue every 5 (five) minutes as needed for chest pain.   Yes Historical Provider, MD  oxyCODONE (OXY IR/ROXICODONE) 5 MG  immediate release tablet Take 5 mg by mouth 3 (three) times daily.   Yes Historical Provider, MD  rosuvastatin (CRESTOR) 20 MG tablet Take 20 mg by mouth daily.   Yes Historical Provider, MD    REVIEW OF SYSTEMS:  Review of Systems  Constitutional: Positive for malaise/fatigue. Negative for fever, chills and weight loss.  HENT: Negative for ear pain, hearing loss and tinnitus.   Eyes: Negative for blurred vision, double vision, pain and redness.  Respiratory: Negative for cough, hemoptysis and shortness of breath.   Cardiovascular: Negative for chest pain, palpitations, orthopnea and leg swelling.  Gastrointestinal: Positive for nausea.  Negative for vomiting, abdominal pain, diarrhea and constipation.  Genitourinary: Negative for dysuria, frequency and hematuria.  Musculoskeletal: Negative for back pain, joint pain and neck pain.  Skin:       No acne, rash, or lesions  Neurological: Negative for dizziness, tremors, focal weakness and weakness.  Endo/Heme/Allergies: Negative for polydipsia. Does not bruise/bleed easily.  Psychiatric/Behavioral: Negative for depression. The patient is not nervous/anxious and does not have insomnia.      VITAL SIGNS:   Filed Vitals:   12/20/15 1704 12/20/15 1715 12/20/15 1730 12/20/15 1930  BP: 101/76 108/72 117/97 99/60  Pulse: 133 129  110  Temp: 98.8 F (37.1 C)     TempSrc: Oral     Resp:   18   Weight: 53.978 kg (119 lb)     SpO2: 96% 96%  96%   Wt Readings from Last 3 Encounters:  12/20/15 53.978 kg (119 lb)  12/15/15 54.1 kg (119 lb 4.3 oz)  02/20/14 51.8 kg (114 lb 3.2 oz)    PHYSICAL EXAMINATION:  Physical Exam  Vitals reviewed. Constitutional: She is oriented to person, place, and time. She appears well-developed and well-nourished. No distress.  HENT:  Head: Normocephalic and atraumatic.  Mouth/Throat: Oropharynx is clear and moist.  Eyes: Conjunctivae and EOM are normal. Pupils are equal, round, and reactive to light. No scleral icterus.  Neck: Normal range of motion. Neck supple. No JVD present. No thyromegaly present.  Cardiovascular: Regular rhythm and intact distal pulses.  Exam reveals no gallop and no friction rub.   No murmur heard. Tachycardic  Respiratory: Effort normal and breath sounds normal. No respiratory distress. She has no wheezes. She has no rales.  GI: Soft. Bowel sounds are normal. She exhibits no distension. There is no tenderness.  Musculoskeletal: Normal range of motion. She exhibits no edema.  Bilateral AKA. No arthritis, no gout  Lymphadenopathy:    She has no cervical adenopathy.  Neurological: She is alert and oriented to person,  place, and time. A cranial nerve deficit is present.  No dysarthria, no aphasia  Skin: Skin is warm and dry. No rash noted. No erythema.  Psychiatric: She has a normal mood and affect. Her behavior is normal. Judgment and thought content normal.    LABORATORY PANEL:   CBC  Recent Labs Lab 12/20/15 1708  WBC 17.9*  HGB 16.5*  HCT 49.6*  PLT 239   ------------------------------------------------------------------------------------------------------------------  Chemistries   Recent Labs Lab 12/20/15 1708  NA 134*  K 3.4*  CL 101  CO2 22  GLUCOSE 132*  BUN 10  CREATININE <0.30*  CALCIUM 9.3  AST 22  ALT 16  ALKPHOS 93  BILITOT 0.9   ------------------------------------------------------------------------------------------------------------------  Cardiac Enzymes No results for input(s): TROPONINI in the last 168 hours. ------------------------------------------------------------------------------------------------------------------  RADIOLOGY:  No results found.  EKG:   Orders placed or performed during the hospital  encounter of 02/20/14  . ED EKG  . ED EKG  . EKG 12-Lead  . EKG 12-Lead    IMPRESSION AND PLAN:  Principal Problem:   Sepsis ([place]) - tachycardia which seems responding well to fluid resuscitation, as well as leukocytosis. UA looks like urinary source for his sepsis. Antibiotics given in the ED, and blood in urine cultures ordered. Chest x-ray pending, the patient has had no respiratory complaints. Continue IV antibiotics and follow-up cultures. Lactic acid ordered, pending. Active Problems:   UTI (lower urinary tract infection) - IV antibiotics and cultures as above.   Type 2 diabetes mellitus (HCC) - sliding scale insulin with corresponding glucose checks before meals and at bedtime and heart healthy/carb modified diet.   HTN (hypertension) - blood pressures on low end, we'll hold antihypertensives for now, fluids for blood pressure support    COPD (chronic obstructive pulmonary disease) ([place]) - not listed on any home meds for this. We'll have duo nebs when necessary available here needed.   GERD (gastroesophageal reflux disease) - not currently on any chronic medications for this. Will treat as needed.  All the records are reviewed and case discussed with ED provider. Management plans discussed with the patient and/or family.  DVT PROPHYLAXIS: Systemic anticoagulation  GI PROPHYLAXIS: None  ADMISSION STATUS: Inpatient  CODE STATUS: Full  TOTAL TIME TAKING CARE OF THIS PATIENT: 45 minutes.    Dartanyan Deasis Sutton 12/20/2015, 9:19 PM  [facility]  Office  [phone]  CC: Primary care physician; Pcp Not In System

## 2015-12-20 NOTE — ED Notes (Signed)
Pt called out reporting lower back pain. Pt rolled to left side and head of bed laid back. Pt verbalized understanding to allow time to see if this position will help decrease pain. RN will reassess shortly.

## 2015-12-20 NOTE — Progress Notes (Signed)
ANTIBIOTIC CONSULT NOTE - INITIAL  Pharmacy Consult for ceftriaxone Indication: UTI  Allergies  Allergen Reactions  . Ace Inhibitors   . Flexeril [Cyclobenzaprine]   . Paxil [Paroxetine Hcl]     Patient Measurements: Weight: 119 lb (53.978 kg) Adjusted Body Weight:   Vital Signs: Temp: 98.8 F (37.1 C) (12/21 1704) Temp Source: Oral (12/21 1704) BP: 94/64 mmHg (12/21 2030) Pulse Rate: 114 (12/21 2030) Intake/Output from previous day:   Intake/Output from this shift:    Labs:  Recent Labs  12/20/15 1708  WBC 17.9*  HGB 16.5*  PLT 239  CREATININE <0.30*   CrCl cannot be calculated (Patient has no serum creatinine result on file.). No results for input(s): VANCOTROUGH, VANCOPEAK, VANCORANDOM, GENTTROUGH, GENTPEAK, GENTRANDOM, TOBRATROUGH, TOBRAPEAK, TOBRARND, AMIKACINPEAK, AMIKACINTROU, AMIKACIN in the last 72 hours.   Microbiology: Recent Results (from the past 720 hour(s))  Urine culture     Status: None   Collection Time: 12/15/15  7:46 PM  Result Value Ref Range Status   Specimen Description URINE, RANDOM  Final   Special Requests NONE  Final   Culture NO GROWTH 2 DAYS  Final   Report Status 12/18/2015 FINAL  Final    Medical History: Past Medical History  Diagnosis Date  . Stroke ([place])   . MI (myocardial infarction) ([place])   . Hypertension   . Asthma   . COPD (chronic obstructive pulmonary disease) ([place])   . GERD (gastroesophageal reflux disease)   . Anemia   . Diabetes mellitus without complication ([place])   . Anxiety   . Cigarette smoker 02/25/2014  . Obesity, unspecified 02/25/2014  . Hx of ischemic right MCA stroke 02/25/2014    Medications:  Infusions:  . cefTRIAXone (ROCEPHIN)  IV 1 g (12/20/15 2144)   Assessment: 49 yof cc anxiety, hx bilat AKA. ED workup revealed urosepsis with no obvious UTI symptoms. Starting ceftriaxone for UTI.   Goal of Therapy:    Plan:  Expected duration 5 days with resolution of temperature and/or  normalization of WBC. Ceftriaxone 1 gm IV Q24H.  Laural Benes, Pharm.D., BCPS Clinical Pharmacist 12/20/2015,10:01 PM
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
## 2015-12-21 ENCOUNTER — Inpatient Hospital Stay: Payer: Medicare Other

## 2015-12-21 LAB — CBC
HEMATOCRIT: 42.3 % (ref 35.0–47.0)
Hemoglobin: 14.4 g/dL (ref 12.0–16.0)
MCH: 33.9 pg (ref 26.0–34.0)
MCHC: 34 g/dL (ref 32.0–36.0)
MCV: 99.7 fL (ref 80.0–100.0)
PLATELETS: 190 10*3/uL (ref 150–440)
RBC: 4.24 MIL/uL (ref 3.80–5.20)
RDW: 12.1 % (ref 11.5–14.5)
WBC: 9.9 10*3/uL (ref 3.6–11.0)

## 2015-12-21 LAB — BASIC METABOLIC PANEL
Anion gap: 5 (ref 5–15)
BUN: 9 mg/dL (ref 6–20)
CALCIUM: 8.3 mg/dL — AB (ref 8.9–10.3)
CO2: 24 mmol/L (ref 22–32)
Chloride: 103 mmol/L (ref 101–111)
Creatinine, Ser: 0.36 mg/dL — ABNORMAL LOW (ref 0.44–1.00)
GFR calc Af Amer: >60 mL/min (ref >60)
Glucose, Bld: 142 mg/dL — ABNORMAL HIGH (ref 65–99)
POTASSIUM: 3.2 mmol/L — AB (ref 3.5–5.1)
SODIUM: 132 mmol/L — AB (ref 135–145)

## 2015-12-21 LAB — GLUCOSE, CAPILLARY
GLUCOSE-CAPILLARY: 163 mg/dL — AB (ref 65–99)
GLUCOSE-CAPILLARY: 122 mg/dL — AB (ref 65–99)
Glucose-Capillary: 116 mg/dL — ABNORMAL HIGH (ref 65–99)

## 2015-12-21 LAB — HEMOGLOBIN A1C: HEMOGLOBIN A1C: 6.3 % — AB (ref 4.0–6.0)

## 2015-12-21 MED ORDER — OXYCODONE HCL 5 MG PO TABS
5.0000 mg | ORAL_TABLET | ORAL | Status: DC | PRN
  Administered 2015-12-22: 5 mg via ORAL
  Filled 2015-12-21: qty 1

## 2015-12-21 MED ORDER — INSULIN GLARGINE 100 UNIT/ML ~~LOC~~ SOLN
30.0000 [IU] | Freq: Every day | SUBCUTANEOUS | Status: DC
  Administered 2015-12-21 – 2015-12-23 (×3): 30 [IU] via SUBCUTANEOUS
  Filled 2015-12-21 (×3): qty 0.3

## 2015-12-21 MED ORDER — POTASSIUM CHLORIDE CRYS ER 20 MEQ PO TBCR
40.0000 meq | EXTENDED_RELEASE_TABLET | Freq: Once | ORAL | Status: AC
  Administered 2015-12-21: 40 meq via ORAL
  Filled 2015-12-21: qty 2

## 2015-12-21 MED ORDER — ZOLPIDEM TARTRATE 5 MG PO TABS
5.0000 mg | ORAL_TABLET | Freq: Every evening | ORAL | Status: DC | PRN
  Administered 2015-12-21 – 2015-12-22 (×2): 5 mg via ORAL
  Filled 2015-12-21 (×2): qty 1

## 2015-12-21 MED ORDER — FUROSEMIDE 20 MG PO TABS
10.0000 mg | ORAL_TABLET | Freq: Two times a day (BID) | ORAL | Status: DC
  Administered 2015-12-21 – 2015-12-23 (×3): 10 mg via ORAL
  Filled 2015-12-21 (×3): qty 1

## 2015-12-21 NOTE — Progress Notes (Signed)
Spoke with dr. Posey Pronto to clarify stop order for normal saline. Per md okay to discontinue fluids

## 2015-12-21 NOTE — Care Management (Signed)
Patient admitted from home with sepsis.  Patient lives at home with a care giver.  Care giver works 3rd shift 8-12 hours a night.  Caregivers boyfriend had been staying during the night to help, however he is now in jail.  Patient states that often time she is also along during the day.  Patient states that her medications come from [facility] in [place].  Patient has a wheelchair at home, and an Transport planner but it does not work.  APS active with the patient.  History of bilateral AKA.  CSW working with patient on placement.  RNCM available if needed for discharge disposition.

## 2015-12-21 NOTE — Clinical Social Work Placement (Signed)
   CLINICAL SOCIAL WORK PLACEMENT  NOTE  Date:  12/21/2015  Patient Details  Name: [name] MRN: [id number] Date of Birth: [birth year]  Clinical Social Work is seeking post-discharge placement for this patient at the [place] level of care (*CSW will initial, date and re-position this form in  chart as items are completed):  Yes   Patient/family provided with [facility] Work Department's list of facilities offering this level of care within the geographic area requested by the patient (or if unable, by the patient's family).  Yes   Patient/family informed of their freedom to choose among providers that offer the needed level of care, that participate in Medicare, Medicaid or managed care program needed by the patient, have an available bed and are willing to accept the patient.  Yes   Patient/family informed of [facility]'s ownership interest in [facility] and [facility], as well as of the fact that they are under no obligation to receive care at these facilities.  PASRR submitted to EDS on 12/21/15     PASRR number received on       Existing PASRR number confirmed on       FL2 transmitted to all facilities in geographic area requested by pt/family on 12/21/15     FL2 transmitted to all facilities within larger geographic area on       Patient informed that his/her managed care company has contracts with or will negotiate with certain facilities, including the following:            Patient/family informed of bed offers received.  Patient chooses bed at       Physician recommends and patient chooses bed at      Patient to be transferred to   on  .  Patient to be transferred to facility by       Patient family notified on   of transfer.  Name of family member notified:        PHYSICIAN       Additional Comment:    _______________________________________________ Loralyn Freshwater, LCSW 12/21/2015, 3:42 PM

## 2015-12-21 NOTE — NC FL2 (Signed)
[place] LEVEL OF CARE SCREENING TOOL     IDENTIFICATION  Patient Name: [name] Birthdate: 1966/04/17 Sex: female Admission Date (Current Location): 12/20/2015  [facility] [place] Number:  Selena Lesser  ([id number]) Facility and Address:  [facility] [address] [place], [place] [address]      Provider Number: [id number]  Attending Physician Name and Address:  Dustin Flock, MD  Relative Name and Phone Number:       Current Level of Care: Hospital Recommended Level of Care: [place] Prior Approval Number:    Date Approved/Denied:   PASRR Number:    Discharge Plan: SNF    Current Diagnoses: Patient Active Problem List   Diagnosis Date Noted  . Sepsis ([place]) 12/20/2015  . UTI (lower urinary tract infection) 12/20/2015  . COPD (chronic obstructive pulmonary disease) ([place]) 12/20/2015  . GERD (gastroesophageal reflux disease) 12/20/2015  . Type 2 diabetes mellitus ([place]) 02/25/2014  . HTN (hypertension) 02/25/2014  . Cigarette smoker 02/25/2014  . Obesity, unspecified 02/25/2014  . Hx of ischemic right MCA stroke, 1998 02/25/2014  . CVA (cerebral infarction) 02/20/2014  Bilateral AKA    Orientation RESPIRATION BLADDER Height & Weight    Self, Time, Situation, Place  Normal Incontinent 4' (121.9 cm) 114 lbs.  BEHAVIORAL SYMPTOMS/MOOD NEUROLOGICAL BOWEL NUTRITION STATUS   (none )  (none ) Continent Diet (Diet: Heart Healthy/ Carb Modified )  AMBULATORY STATUS COMMUNICATION OF NEEDS Skin   Extensive Assist Verbally Other (Comment) (Bilateral Above knee leg amputation)                       Personal Care Assistance Level of Assistance  Bathing, Feeding, Dressing Bathing Assistance: Limited assistance Feeding assistance: Independent Dressing Assistance: Limited assistance     Functional Limitations Info  Sight, Hearing, Speech Sight Info: Adequate Hearing Info: Adequate Speech Info: Adequate     SPECIAL CARE FACTORS FREQUENCY  PT (By licensed PT), OT (By licensed OT)     PT Frequency:  (5) OT Frequency:  (5)            Contractures      Additional Factors Info  Code Status, Allergies, Insulin Sliding Scale Code Status Info:  (Full Code. ) Allergies Info:  (Ace Inhibitors, Flexeril Cyclobenzaprine, Paxil Paroxetine Hcl)   Insulin Sliding Scale Info:  (Novolog Insulin Injections 3 times daily )       Current Medications (12/21/2015):  This is the current hospital active medication list Current Facility-Administered Medications  Medication Dose Route Frequency Provider Last Rate Last Dose  . acetaminophen (TYLENOL) tablet 650 mg  650 mg Oral Q6H PRN Lance Coon, MD       Or  . acetaminophen (TYLENOL) suppository 650 mg  650 mg Rectal Q6H PRN Lance Coon, MD      . apixaban Arne Cleveland) tablet 5 mg  5 mg Oral BID Lance Coon, MD   5 mg at 12/21/15 [id number]  . cefTRIAXone (ROCEPHIN) 1 g in dextrose 5 % 50 mL IVPB  1 g Intravenous Q24H Lance Coon, MD      . Derrill Memo ON 12/22/2015] digoxin (LANOXIN) tablet 0.125 mg  0.125 mg Oral Once per day on Mon Wed Fri Lance Coon, MD      . DULoxetine (CYMBALTA) DR capsule 60 mg  60 mg Oral BID Lance Coon, MD   60 mg at 12/21/15 [id number]  . furosemide (LASIX) tablet 10 mg  10 mg Oral BID Dustin Flock, MD      .  insulin aspart (novoLOG) injection 0-5 Units  0-5 Units Subcutaneous QHS Lance Coon, MD   3 Units at 12/20/15 2354  . insulin aspart (novoLOG) injection 0-9 Units  0-9 Units Subcutaneous TID WC Lance Coon, MD   1 Units at 12/21/15 1224  . insulin glargine (LANTUS) injection 30 Units  30 Units Subcutaneous Daily Dustin Flock, MD   30 Units at 12/21/15 1223  . ipratropium-albuterol (DUONEB) 0.5-2.5 (3) MG/3ML nebulizer solution 3 mL  3 mL Nebulization Q4H PRN Lance Coon, MD      . levothyroxine (SYNTHROID, LEVOTHROID) tablet 25 mcg  25 mcg Oral QAC breakfast Lance Coon, MD   25 mcg at 12/21/15 0840  . ondansetron  (ZOFRAN) tablet 4 mg  4 mg Oral Q6H PRN Lance Coon, MD       Or  . ondansetron [facility]) injection 4 mg  4 mg Intravenous Q6H PRN Lance Coon, MD      . oxyCODONE (Oxy IR/ROXICODONE) immediate release tablet 5 mg  5 mg Oral Q6H PRN Harvest Dark, MD   5 mg at 12/21/15 1336  . rosuvastatin (CRESTOR) tablet 20 mg  20 mg Oral Daily Lance Coon, MD   20 mg at 12/21/15 0840  . sodium chloride 0.9 % injection 3 mL  3 mL Intravenous Q12H Lance Coon, MD   3 mL at 12/20/15 2330     Discharge Medications: Please see discharge summary for a list of discharge medications.  Relevant Imaging Results:  Relevant Lab Results:   Additional Information  (SSN: [id number])  Loralyn Freshwater, LCSW
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
## 2015-12-21 NOTE — Plan of Care (Signed)
Problem: Health Behavior/Discharge Planning: Goal: Ability to manage health-related needs will improve Outcome: Not Met (add Reason) Patient needs placement due to, patient not able to provide for self.

## 2015-12-21 NOTE — Care Management (Signed)
[name] from [place] on the unit in response to an APS report that was called 12/21.
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
## 2015-12-21 NOTE — Progress Notes (Signed)
[place] at [facility]                                                                                                                                                                                            Patient Demographics   [name], is a 49 y.o. female, DOB - [birth year], [id number]  Admit date - 12/20/2015   Admitting Physician Lance Coon, MD  Outpatient Primary MD for the patient is Pcp Not In System   LOS - 1  Subjective: Patient admitted with sepsis with low blood pressure noted to have urinary tract infection. Overnight blood pressure was low. Complains of swelling in the right thigh region. 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 He feels that maybe she has too much fluid in her body. Has pain all over. Complains of generalized weakness     Review of Systems:   CONSTITUTIONAL: No documented fever. No fatigue, weakness. No weight gain, no weight loss.  EYES: No blurry or double vision.  ENT: No tinnitus. No postnasal drip. No redness of the oropharynx.  RESPIRATORY: No cough, no wheeze, no hemoptysis. No dyspnea.  CARDIOVASCULAR: No chest pain. No orthopnea. No palpitations. No syncope.  GASTROINTESTINAL: No nausea, no vomiting or diarrhea. No abdominal pain. No melena or hematochezia.  GENITOURINARY: No dysuria or hematuria.  ENDOCRINE: No polyuria or nocturia. No heat or cold intolerance.  HEMATOLOGY: No anemia. No bruising. No bleeding.  INTEGUMENTARY: No rashes. No lesions.  MUSCULOSKELETAL: No arthritis. No swelling. No gout.  NEUROLOGIC: No numbness, tingling, or ataxia. No seizure-type activity.  PSYCHIATRIC: No anxiety. No insomnia. No ADD.    Vitals:   Filed Vitals:   12/21/15 0600 12/21/15 0814 12/21/15 1202 12/21/15 1335  BP: 90/66 92/59 91/57  152/78  Pulse:   94   Temp:   97.5 F (36.4 C)   TempSrc:      Resp:   20   Height:      Weight:      SpO2:   96%     Wt Readings from Last 3 Encounters:   12/20/15 52.073 kg (114 lb 12.8 oz)  12/15/15 54.1 kg (119 lb 4.3 oz)  02/20/14 51.8 kg (114 lb 3.2 oz)     Intake/Output Summary (Last 24 hours) at 12/21/15 1413 Last data filed at 12/21/15 1254  Gross per 24 hour  Intake 1051.67 ml  Output    650 ml  Net 401.67 ml    Physical Exam:   GENERAL: Pleasant-appearing in no apparent distress.  HEAD, EYES, EARS, NOSE AND THROAT: Atraumatic, normocephalic. Extraocular muscles are intact. Pupils equal and  reactive to light. Sclerae anicteric. No conjunctival injection. No oro-pharyngeal erythema.  NECK: Supple. There is no jugular venous distention. No bruits, no lymphadenopathy, no thyromegaly.  HEART: Regular rate and rhythm,. No murmurs, no rubs, no clicks.  LUNGS: Clear to auscultation bilaterally. No rales or rhonchi. No wheezes.  ABDOMEN: Soft, flat, nontender, nondistended. Has good bowel sounds. No hepatosplenomegaly appreciated.  EXTREMITIES: Bilateral AKA   NEUROLOGIC: The patient is alert, awake, and oriented x3 with no focal motor or sensory deficits appreciated bilaterally.  SKIN: Moist and warm with no rashes appreciated.  Psych: Not anxious, depressed LN: No inguinal LN enlargement    Antibiotics   Anti-infectives    Start     Dose/Rate Route Frequency Ordered Stop   12/21/15 2100  cefTRIAXone (ROCEPHIN) 1 g in dextrose 5 % 50 mL IVPB     1 g 100 mL/hr over 30 Minutes Intravenous Every 24 hours 12/20/15 2309     12/20/15 2100  cefTRIAXone (ROCEPHIN) 1 g in dextrose 5 % 50 mL IVPB     1 g 100 mL/hr over 30 Minutes Intravenous  Once 12/20/15 2048 12/20/15 2214      Medications   Scheduled Meds: . apixaban  5 mg Oral BID  . cefTRIAXone (ROCEPHIN)  IV  1 g Intravenous Q24H  . [START ON 12/22/2015] digoxin  0.125 mg Oral Once per day on Mon Wed Fri  . DULoxetine  60 mg Oral BID  . furosemide  10 mg Oral BID  . insulin aspart  0-5 Units Subcutaneous QHS  . insulin aspart  0-9 Units Subcutaneous TID WC  .  insulin glargine  30 Units Subcutaneous Daily  . levothyroxine  25 mcg Oral QAC breakfast  . rosuvastatin  20 mg Oral Daily  . sodium chloride  3 mL Intravenous Q12H   Continuous Infusions:  PRN Meds:.acetaminophen **OR** acetaminophen, ipratropium-albuterol, ondansetron **OR** ondansetron (ZOFRAN) IV, oxyCODONE   Data Review:   Micro Results Recent Results (from the past 240 hour(s))  Urine culture     Status: None   Collection Time: 12/15/15  7:46 PM  Result Value Ref Range Status   Specimen Description URINE, RANDOM  Final   Special Requests NONE  Final   Culture NO GROWTH 2 DAYS  Final   Report Status 12/18/2015 FINAL  Final  Urine culture     Status: None (Preliminary result)   Collection Time: 12/20/15  7:39 PM  Result Value Ref Range Status   Specimen Description URINE, RANDOM  Final   Special Requests NONE  Final   Culture NO GROWTH < 12 HOURS  Final   Report Status PENDING  Incomplete    Radiology Reports Dg Chest 1 View  12/21/2015  CLINICAL DATA:  Sepsis EXAM: CHEST 1 VIEW COMPARISON:  None. FINDINGS: Normal heart size and mediastinal contours. No acute infiltrate or edema. No effusion or pneumothorax. No acute osseous findings. IMPRESSION: No active disease. Electronically Signed   By: Monte Fantasia M.D.   On: 12/21/2015 06:04   US Venous Img Lower Unilateral Right  12/15/2015  CLINICAL DATA:  Right leg pain and swelling for 1 month. History of pain, edema, tobacco use, and aspirin therapy. Right above the knee amputation. EXAM: LOWER EXTREMITY VENOUS DOPPLER ULTRASOUND TECHNIQUE: Gray-scale sonography with graded compression, as well as color Doppler and duplex ultrasound were performed to evaluate the lower extremity deep venous systems from the level of the common femoral vein and including the common femoral, femoral, profunda femoral, popliteal and calf  veins including the posterior tibial, peroneal and gastrocnemius veins when visible. The superficial great  saphenous vein was also interrogated. Spectral Doppler was utilized to evaluate flow at rest and with distal augmentation maneuvers in the common femoral, femoral and popliteal veins. COMPARISON:  None. FINDINGS: Contralateral Common Femoral Vein: Respiratory phasicity is normal and symmetric with the symptomatic side. No evidence of thrombus. Normal compressibility. Common Femoral Vein: No evidence of thrombus. Normal compressibility, respiratory phasicity and response to augmentation. Saphenofemoral Junction: No evidence of thrombus. Normal compressibility and flow on color Doppler imaging. Profunda Femoral Vein: No evidence of thrombus. Normal compressibility and flow on color Doppler imaging. Femoral Vein: Limited visualization. Small vein diameter. No thrombus demonstrated where visualized. Popliteal Vein: Surgically absent. Calf Veins: Surgically absent. Venous Reflux:  None. Other Findings:  Right above the knee amputation. IMPRESSION: No evidence of deep venous thrombosis. Right above-the-knee amputation. Electronically Signed   By: Lucienne Capers M.D.   On: 12/15/2015 22:44     CBC  Recent Labs Lab 12/15/15 2046 12/20/15 1708 12/21/15 0449  WBC 11.7* 17.9* 9.9  HGB 15.4 16.5* 14.4  HCT 44.5 49.6* 42.3  PLT 185 239 190  MCV 97.5 99.2 99.7  MCH 33.7 33.0 33.9  MCHC 34.6 33.3 34.0  RDW 12.2 12.1 12.1  LYMPHSABS 3.6 4.7*  --   MONOABS 0.4 0.7  --   EOSABS 0.0 0.1  --   BASOSABS 0.1 0.1  --     Chemistries   Recent Labs Lab 12/15/15 2046 12/20/15 1708 12/21/15 0449  NA 137 134* 132*  K 3.4* 3.4* 3.2*  CL 103 101 103  CO2 26 22 24   GLUCOSE 169* 132* 142*  BUN 7 10 9   CREATININE <0.30* <0.30* 0.36*  CALCIUM 9.0 9.3 8.3*  AST  --  22  --   ALT  --  16  --   ALKPHOS  --  93  --   BILITOT  --  0.9  --    ------------------------------------------------------------------------------------------------------------------ estimated creatinine clearance is 42.9 mL/min (by C-G  formula based on Cr of 0.36). ------------------------------------------------------------------------------------------------------------------  Recent Labs  12/20/15 1708  HGBA1C 6.3*   ------------------------------------------------------------------------------------------------------------------ No results for input(s): CHOL, HDL, LDLCALC, TRIG, CHOLHDL, LDLDIRECT in the last 72 hours. ------------------------------------------------------------------------------------------------------------------ No results for input(s): TSH, T4TOTAL, T3FREE, THYROIDAB in the last 72 hours.  Invalid input(s): FREET3 ------------------------------------------------------------------------------------------------------------------ No results for input(s): VITAMINB12, FOLATE, FERRITIN, TIBC, IRON, RETICCTPCT in the last 72 hours.  Coagulation profile No results for input(s): INR, PROTIME in the last 168 hours.  No results for input(s): DDIMER in the last 72 hours.  Cardiac Enzymes No results for input(s): CKMB, TROPONINI, MYOGLOBIN in the last 168 hours.  Invalid input(s): CK ------------------------------------------------------------------------------------------------------------------ Invalid input(s): [place]  Patient is a 49 year old white female admitted with sepsis.  1. Sepsis ([place]) due to urinary tract infection continue IV ceftriaxone follow cultures.  2.  Type 2 diabetes mellitus ([place]) continue sliding scale insulin and Lantus. 3. HTN (hypertension) hypotensive overnight hold imdure 4.  UTI (lower urinary tract infection) present on admission continue ceftriaxone and await cultures 5. When necessary nebulizers  COPD (chronic obstructive pulmonary disease) ([place]) 6.   GERD (gastroesophageal reflux disease) 7. Hypothryroid: contnue synthroid     Code Status Orders        Start     Ordered   12/20/15 2317  Full code   Continuous     12/20/15 2316  Consults pt  DVT Prophylaxis  eliquis  Lab Results  Component Value Date   PLT 190 12/21/2015     Time Spent in minutes  84min  Dustin Flock M.D on 12/21/2015 at 2:13 PM  Between 7am to 6pm - Pager - [phone]  After 6pm go to [email or web address] - password [email or web address] [place] [facility]   Office  [phone]

## 2015-12-22 LAB — BASIC METABOLIC PANEL
Anion gap: 7 (ref 5–15)
Anion gap: 5 (ref 5–15)
BUN: 10 mg/dL (ref 6–20)
BUN: 11 mg/dL (ref 6–20)
CALCIUM: 9.2 mg/dL (ref 8.9–10.3)
CO2: 28 mmol/L (ref 22–32)
CO2: 28 mmol/L (ref 22–32)
CREATININE: 0.3 mg/dL — AB (ref 0.44–1.00)
CREATININE: 0.37 mg/dL — AB (ref 0.44–1.00)
Calcium: 9.5 mg/dL (ref 8.9–10.3)
Chloride: 103 mmol/L (ref 101–111)
Chloride: 100 mmol/L — ABNORMAL LOW (ref 101–111)
GFR calc Af Amer: >60 mL/min (ref >60)
GFR calc Af Amer: >60 mL/min (ref >60)
GLUCOSE: 125 mg/dL — AB (ref 65–99)
Glucose, Bld: 161 mg/dL — ABNORMAL HIGH (ref 65–99)
POTASSIUM: 3.8 mmol/L (ref 3.5–5.1)
Potassium: 4.2 mmol/L (ref 3.5–5.1)
SODIUM: 133 mmol/L — AB (ref 135–145)
Sodium: 138 mmol/L (ref 135–145)

## 2015-12-22 LAB — GLUCOSE, CAPILLARY
Glucose-Capillary: 130 mg/dL — ABNORMAL HIGH (ref 65–99)
Glucose-Capillary: 141 mg/dL — ABNORMAL HIGH (ref 65–99)
Glucose-Capillary: 119 mg/dL — ABNORMAL HIGH (ref 65–99)
Glucose-Capillary: 98 mg/dL (ref 65–99)

## 2015-12-22 LAB — URINE CULTURE: Culture: NO GROWTH

## 2015-12-22 LAB — MAGNESIUM: MAGNESIUM: 1.7 mg/dL (ref 1.7–2.4)

## 2015-12-22 LAB — DIGOXIN LEVEL: DIGOXIN LVL: 0.3 ng/mL — AB (ref 0.8–2.0)

## 2015-12-22 MED ORDER — GABAPENTIN 300 MG PO CAPS
300.0000 mg | ORAL_CAPSULE | Freq: Three times a day (TID) | ORAL | Status: DC
  Administered 2015-12-22 – 2015-12-23 (×3): 300 mg via ORAL
  Filled 2015-12-22 (×3): qty 1

## 2015-12-22 MED ORDER — OXYCODONE HCL 5 MG PO TABS
10.0000 mg | ORAL_TABLET | ORAL | Status: DC | PRN
  Administered 2015-12-22 – 2015-12-23 (×5): 10 mg via ORAL
  Filled 2015-12-22 (×5): qty 2

## 2015-12-22 MED ORDER — CEFUROXIME AXETIL 250 MG PO TABS
500.0000 mg | ORAL_TABLET | Freq: Two times a day (BID) | ORAL | Status: DC
  Administered 2015-12-22 – 2015-12-23 (×2): 500 mg via ORAL
  Filled 2015-12-22 (×2): qty 2

## 2015-12-22 MED ORDER — DOCUSATE SODIUM 100 MG PO CAPS: 100.0000 mg | ORAL_CAPSULE | Freq: Every day | ORAL | Status: DC | PRN

## 2015-12-22 NOTE — Progress Notes (Signed)
[place] at [facility]                                                                                                                                                                                            Patient Demographics   [name], is a 49 y.o. female, DOB - 09/07/1966, [id number]  Admit date - 12/20/2015   Admitting Physician Lance Coon, MD  Outpatient Primary MD for the patient is Pcp Not In System   LOS - 2  Subjective: Feeling better. She is noted to be on telemetry with intermittent second-degree heart block. Denies any complaints.   Review of Systems:   CONSTITUTIONAL: No documented fever. No fatigue, weakness. No weight gain, no weight loss.  EYES: No blurry or double vision.  ENT: No tinnitus. No postnasal drip. No redness of the oropharynx.  RESPIRATORY: No cough, no wheeze, no hemoptysis. No dyspnea.  CARDIOVASCULAR: No chest pain. No orthopnea. No palpitations. No syncope.  GASTROINTESTINAL: No nausea, no vomiting or diarrhea. No abdominal pain. No melena or hematochezia.  GENITOURINARY: No dysuria or hematuria.  ENDOCRINE: No polyuria or nocturia. No heat or cold intolerance.  HEMATOLOGY: No anemia. No bruising. No bleeding.  INTEGUMENTARY: No rashes. No lesions.  MUSCULOSKELETAL: No arthritis. No swelling. No gout.  NEUROLOGIC: No numbness, tingling, or ataxia. No seizure-type activity.  PSYCHIATRIC: No anxiety. No insomnia. No ADD.    Vitals:   Filed Vitals:   12/21/15 1925 12/22/15 0528 12/22/15 0758 12/22/15 1111  BP: 98/64 100/55 100/58 109/60  Pulse: 88 73  74  Temp: 98.1 F (36.7 C) 98.6 F (37 C)  98.1 F (36.7 C)  TempSrc: Oral Oral  Oral  Resp: 20 21  20   Height:      Weight:      SpO2: 98% 96%  98%    Wt Readings from Last 3 Encounters:  12/20/15 52.073 kg (114 lb 12.8 oz)  12/15/15 54.1 kg (119 lb 4.3 oz)  02/20/14 51.8 kg (114 lb 3.2 oz)     Intake/Output Summary (Last 24 hours)  at 12/22/15 1336 Last data filed at 12/22/15 0830  Gross per 24 hour  Intake    240 ml  Output    650 ml  Net   -410 ml    Physical Exam:   GENERAL: Pleasant-appearing in no apparent distress.  HEAD, EYES, EARS, NOSE AND THROAT: Atraumatic, normocephalic. Extraocular muscles are intact. Pupils equal and reactive to light. Sclerae anicteric. No conjunctival injection. No oro-pharyngeal erythema.  NECK: Supple. There is no jugular venous distention. No bruits, no lymphadenopathy, no thyromegaly.  HEART:  Regular rate and rhythm,. No murmurs, no rubs, no clicks.  LUNGS: Clear to auscultation bilaterally. No rales or rhonchi. No wheezes.  ABDOMEN: Soft, flat, nontender, nondistended. Has good bowel sounds. No hepatosplenomegaly appreciated.  EXTREMITIES: Bilateral AKA   NEUROLOGIC: The patient is alert, awake, and oriented x3 with no focal motor or sensory deficits appreciated bilaterally.  SKIN: Moist and warm with no rashes appreciated.  Psych: Not anxious, depressed LN: No inguinal LN enlargement    Antibiotics   Anti-infectives    Start     Dose/Rate Route Frequency Ordered Stop   12/22/15 1700  cefUROXime (CEFTIN) tablet 500 mg     500 mg Oral 2 times daily with meals 12/22/15 1335     12/21/15 2100  cefTRIAXone (ROCEPHIN) 1 g in dextrose 5 % 50 mL IVPB  Status:  Discontinued     1 g 100 mL/hr over 30 Minutes Intravenous Every 24 hours 12/20/15 2309 12/22/15 1335   12/20/15 2100  cefTRIAXone (ROCEPHIN) 1 g in dextrose 5 % 50 mL IVPB     1 g 100 mL/hr over 30 Minutes Intravenous  Once 12/20/15 2048 12/20/15 2214      Medications   Scheduled Meds: . apixaban  5 mg Oral BID  . cefUROXime  500 mg Oral BID WC  . DULoxetine  60 mg Oral BID  . furosemide  10 mg Oral BID  . gabapentin  300 mg Oral TID  . insulin aspart  0-5 Units Subcutaneous QHS  . insulin aspart  0-9 Units Subcutaneous TID WC  . insulin glargine  30 Units Subcutaneous Daily  . levothyroxine  25 mcg Oral  QAC breakfast  . rosuvastatin  20 mg Oral Daily  . sodium chloride  3 mL Intravenous Q12H   Continuous Infusions:  PRN Meds:.acetaminophen **OR** acetaminophen, docusate sodium, ipratropium-albuterol, ondansetron **OR** ondansetron (ZOFRAN) IV, oxyCODONE, zolpidem   Data Review:   Micro Results Recent Results (from the past 240 hour(s))  Urine culture     Status: None   Collection Time: 12/15/15  7:46 PM  Result Value Ref Range Status   Specimen Description URINE, RANDOM  Final   Special Requests NONE  Final   Culture NO GROWTH 2 DAYS  Final   Report Status 12/18/2015 FINAL  Final  Urine culture     Status: None   Collection Time: 12/20/15  7:39 PM  Result Value Ref Range Status   Specimen Description URINE, RANDOM  Final   Special Requests NONE  Final   Culture NO GROWTH 2 DAYS  Final   Report Status 12/22/2015 FINAL  Final  Culture, blood (routine x 2)     Status: None (Preliminary result)   Collection Time: 12/20/15  9:16 PM  Result Value Ref Range Status   Specimen Description BLOOD RIGHT HAND  Final   Special Requests BOTTLES DRAWN AEROBIC AND ANAEROBIC 5ML  Final   Culture NO GROWTH 2 DAYS  Final   Report Status PENDING  Incomplete  Culture, blood (routine x 2)     Status: None (Preliminary result)   Collection Time: 12/20/15  9:33 PM  Result Value Ref Range Status   Specimen Description BLOOD LEFT WRIST  Final   Special Requests BOTTLES DRAWN AEROBIC AND ANAEROBIC 5ML  Final   Culture NO GROWTH 2 DAYS  Final   Report Status PENDING  Incomplete    Radiology Reports Dg Chest 1 View  12/21/2015  CLINICAL DATA:  Sepsis EXAM: CHEST 1 VIEW COMPARISON:  None. FINDINGS:  Normal heart size and mediastinal contours. No acute infiltrate or edema. No effusion or pneumothorax. No acute osseous findings. IMPRESSION: No active disease. Electronically Signed   By: Monte Fantasia M.D.   On: 12/21/2015 06:04   US Venous Img Lower Unilateral Right  12/15/2015  CLINICAL DATA:   Right leg pain and swelling for 1 month. History of pain, edema, tobacco use, and aspirin therapy. Right above the knee amputation. EXAM: LOWER EXTREMITY VENOUS DOPPLER ULTRASOUND TECHNIQUE: Gray-scale sonography with graded compression, as well as color Doppler and duplex ultrasound were performed to evaluate the lower extremity deep venous systems from the level of the common femoral vein and including the common femoral, femoral, profunda femoral, popliteal and calf veins including the posterior tibial, peroneal and gastrocnemius veins when visible. The superficial great saphenous vein was also interrogated. Spectral Doppler was utilized to evaluate flow at rest and with distal augmentation maneuvers in the common femoral, femoral and popliteal veins. COMPARISON:  None. FINDINGS: Contralateral Common Femoral Vein: Respiratory phasicity is normal and symmetric with the symptomatic side. No evidence of thrombus. Normal compressibility. Common Femoral Vein: No evidence of thrombus. Normal compressibility, respiratory phasicity and response to augmentation. Saphenofemoral Junction: No evidence of thrombus. Normal compressibility and flow on color Doppler imaging. Profunda Femoral Vein: No evidence of thrombus. Normal compressibility and flow on color Doppler imaging. Femoral Vein: Limited visualization. Small vein diameter. No thrombus demonstrated where visualized. Popliteal Vein: Surgically absent. Calf Veins: Surgically absent. Venous Reflux:  None. Other Findings:  Right above the knee amputation. IMPRESSION: No evidence of deep venous thrombosis. Right above-the-knee amputation. Electronically Signed   By: Lucienne Capers M.D.   On: 12/15/2015 22:44     CBC  Recent Labs Lab 12/15/15 2046 12/20/15 1708 12/21/15 0449  WBC 11.7* 17.9* 9.9  HGB 15.4 16.5* 14.4  HCT 44.5 49.6* 42.3  PLT 185 239 190  MCV 97.5 99.2 99.7  MCH 33.7 33.0 33.9  MCHC 34.6 33.3 34.0  RDW 12.2 12.1 12.1  LYMPHSABS 3.6 4.7*   --   MONOABS 0.4 0.7  --   EOSABS 0.0 0.1  --   BASOSABS 0.1 0.1  --     Chemistries   Recent Labs Lab 12/15/15 2046 12/20/15 1708 12/21/15 0449 12/22/15 0511  NA 137 134* 132* 138  K 3.4* 3.4* 3.2* 3.8  CL 103 101 103 103  CO2 26 22 24 28   GLUCOSE 169* 132* 142* 125*  BUN 7 10 9 10   CREATININE <0.30* <0.30* 0.36* 0.30*  CALCIUM 9.0 9.3 8.3* 9.2  AST  --  22  --   --   ALT  --  16  --   --   ALKPHOS  --  93  --   --   BILITOT  --  0.9  --   --    ------------------------------------------------------------------------------------------------------------------ estimated creatinine clearance is 42.9 mL/min (by C-G formula based on Cr of 0.3). ------------------------------------------------------------------------------------------------------------------  Recent Labs  12/20/15 1708  HGBA1C 6.3*   ------------------------------------------------------------------------------------------------------------------ No results for input(s): CHOL, HDL, LDLCALC, TRIG, CHOLHDL, LDLDIRECT in the last 72 hours. ------------------------------------------------------------------------------------------------------------------ No results for input(s): TSH, T4TOTAL, T3FREE, THYROIDAB in the last 72 hours.  Invalid input(s): FREET3 ------------------------------------------------------------------------------------------------------------------ No results for input(s): VITAMINB12, FOLATE, FERRITIN, TIBC, IRON, RETICCTPCT in the last 72 hours.  Coagulation profile No results for input(s): INR, PROTIME in the last 168 hours.  No results for input(s): DDIMER in the last 72 hours.  Cardiac Enzymes No results for input(s): CKMB, TROPONINI, MYOGLOBIN in  the last 168 hours.  Invalid input(s): CK ------------------------------------------------------------------------------------------------------------------ Invalid input(s): [place]  Patient is a 49 year old  white female admitted with sepsis.  1. Sepsis ([place]) due to urinary tract infection  urine culture no growth change ceftriaxone to Ceftin 2.  Type 2 diabetes mellitus (H[facility]) continue sliding scale insulin and Lantus. Blood glucose stable 3. HTN (hypertension) hypotensive overnight hold imdure 4.  UTI (lower urinary tract infection) present on admission Ceftin 5.  COPD (chronic obstructive pulmonary disease) (H[facility]) when necessary nebulizer 6.   GERD (gastroesophageal reflux disease) Place patient on PPIs 7. Hypothryroid: contnue synthroid 8. Intermittent heart block will check a magnesium level stop digoxin     Code Status Orders        Start     Ordered   12/20/15 2317  Full code   Continuous     12/20/15 2316           Consults pt  DVT Prophylaxis  eliquis  Lab Results  Component Value Date   PLT 190 12/21/2015     Time Spent in minutes  39min  Takerra Lupinacci M.D on 12/22/2015 at 1:36 PM  Between 7am to 6pm - Pager - [phone]  After 6pm go to [email or web address] - password [email or web address] [place] [facility]   Office  [phone]

## 2015-12-22 NOTE — Progress Notes (Signed)
CSW presented bed offers to patient. Patient accepted offer from [facility]. There were no bed offers in [facility]. Plan is for patient to discharge to [facility] tomorrow (12/23/15). Per Helene Kelp, Admissions Coordinator at [facility] patient will go to room 87- B. Per Helene Kelp she will accept patient with a pending [facility] number. CSW faxed the additional information to [facility] that was requested. [facility] will re-open Tuesday 12/26/15. Per [facility]PS they will assist with getting patient her clothes when they re-open on 12/26/15. CSW will continue to follow and assist as needed.   Ernest Pine, MSW, [place] Social Work Department [phone]

## 2015-12-22 NOTE — Progress Notes (Signed)
ANTIBIOTIC CONSULT NOTE - Follow Up  Pharmacy Consult for ceftriaxone Indication: UTI  Allergies  Allergen Reactions  . Ace Inhibitors   . Flexeril [Cyclobenzaprine]   . Paxil [Paroxetine Hcl]     Patient Measurements: Height: 4' (121.9 cm) Weight: 114 lb 12.8 oz (52.073 kg) IBW/kg (Calculated) : 17.9   Vital Signs: Temp: 98.6 F (37 C) (12/23 0528) Temp Source: Oral (12/23 0528) BP: 100/58 mmHg (12/23 0758) Pulse Rate: 73 (12/23 0528) Intake/Output from previous day: 12/22 0701 - 12/23 0700 In: 821.7 [P.O.:480; I.V.:341.7] Out: 700 [Urine:700] Intake/Output from this shift:    Labs:  Recent Labs  12/20/15 1708 12/21/15 0449 12/22/15 0511  WBC 17.9* 9.9  --   HGB 16.5* 14.4  --   PLT 239 190  --   CREATININE <0.30* 0.36* 0.30*   Estimated Creatinine Clearance: 42.9 mL/min (by C-G formula based on Cr of 0.3). No results for input(s): VANCOTROUGH, VANCOPEAK, VANCORANDOM, GENTTROUGH, GENTPEAK, GENTRANDOM, TOBRATROUGH, TOBRAPEAK, TOBRARND, AMIKACINPEAK, AMIKACINTROU, AMIKACIN in the last 72 hours.   Microbiology: Recent Results (from the past 720 hour(s))  Urine culture     Status: None   Collection Time: 12/15/15  7:46 PM  Result Value Ref Range Status   Specimen Description URINE, RANDOM  Final   Special Requests NONE  Final   Culture NO GROWTH 2 DAYS  Final   Report Status 12/18/2015 FINAL  Final  Urine culture     Status: None (Preliminary result)   Collection Time: 12/20/15  7:39 PM  Result Value Ref Range Status   Specimen Description URINE, RANDOM  Final   Special Requests NONE  Final   Culture NO GROWTH < 12 HOURS  Final   Report Status PENDING  Incomplete  Culture, blood (routine x 2)     Status: None (Preliminary result)   Collection Time: 12/20/15  9:16 PM  Result Value Ref Range Status   Specimen Description BLOOD RIGHT HAND  Final   Special Requests BOTTLES DRAWN AEROBIC AND ANAEROBIC 5ML  Final   Culture NO GROWTH 2 DAYS  Final   Report  Status PENDING  Incomplete  Culture, blood (routine x 2)     Status: None (Preliminary result)   Collection Time: 12/20/15  9:33 PM  Result Value Ref Range Status   Specimen Description BLOOD LEFT WRIST  Final   Special Requests BOTTLES DRAWN AEROBIC AND ANAEROBIC 5ML  Final   Culture NO GROWTH 2 DAYS  Final   Report Status PENDING  Incomplete    Medical History: Past Medical History  Diagnosis Date  . Stroke ([place])   . MI (myocardial infarction) ([place])   . Hypertension   . Asthma   . COPD (chronic obstructive pulmonary disease) ([place])   . GERD (gastroesophageal reflux disease)   . Anemia   . Diabetes mellitus without complication ([place])   . Anxiety   . Cigarette smoker 02/25/2014  . Obesity, unspecified 02/25/2014  . Hx of ischemic right MCA stroke 02/25/2014    Medications:  Ceftriaxone 1 gm IV q24h   Assessment: 49 yof cc anxiety, hx bilat AKA. ED workup revealed urosepsis with no obvious UTI symptoms. Starting ceftriaxone for UTI.   Goal of Therapy:  Resolution of infection  Plan:  Expected duration 5 days with resolution of temperature and/or normalization of WBC. Continue Ceftriaxone 1 gm IV Q24H.  Aowyn Rozeboom G, Pharm.D., BCPS Clinical Pharmacist 12/22/2015,9:46 AM

## 2015-12-22 NOTE — Progress Notes (Signed)
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 Spoke with dr. Posey Pronto to make aware. Per md will order lab work, no other orders received. Will continue to monitor closely

## 2015-12-22 NOTE — Care Management Important Message (Signed)
Important Message  Patient Details  Name: [name] MRN: [id number] Date of Birth: 08-24-1966   Medicare Important Message Given:  Yes    Juliann Pulse A Lewi Drost 12/22/2015, 11:21 AM

## 2015-12-23 LAB — GLUCOSE, CAPILLARY: GLUCOSE-CAPILLARY: 126 mg/dL — AB (ref 65–99)

## 2015-12-23 MED ORDER — DOCUSATE SODIUM 100 MG PO CAPS: 100.0000 mg | ORAL_CAPSULE | Freq: Every day | ORAL | Status: DC | PRN

## 2015-12-23 MED ORDER — CEFUROXIME AXETIL 500 MG PO TABS: 500.0000 mg | ORAL_TABLET | Freq: Two times a day (BID) | ORAL | Status: DC

## 2015-12-23 MED ORDER — FENTANYL 25 MCG/HR TD PT72
50.0000 ug | MEDICATED_PATCH | TRANSDERMAL | Status: DC
  Administered 2015-12-23: 50 ug via TRANSDERMAL
  Filled 2015-12-23: qty 2

## 2015-12-23 MED ORDER — OXYCODONE HCL 10 MG PO TABS: 10.0000 mg | ORAL_TABLET | ORAL | Status: DC | PRN

## 2015-12-23 MED ORDER — FENTANYL 50 MCG/HR TD PT72: 50.0000 ug | MEDICATED_PATCH | TRANSDERMAL | Status: DC

## 2015-12-23 NOTE — Clinical Social Work Placement (Signed)
   CLINICAL SOCIAL WORK PLACEMENT  NOTE  Date:  12/23/2015  Patient Details  Name: [name] MRN: [id number] Date of Birth: [birth year]  Clinical Social Work is seeking post-discharge placement for this patient at the [place] level of care (*CSW will initial, date and re-position this form in  chart as items are completed):  Yes   Patient/family provided with [place] Work Department's list of facilities offering this level of care within the geographic area requested by the patient (or if unable, by the patient's family).  Yes   Patient/family informed of their freedom to choose among providers that offer the needed level of care, that participate in Medicare, Medicaid or managed care program needed by the patient, have an available bed and are willing to accept the patient.  Yes   Patient/family informed of [place]'s ownership interest in [facility] and [facility], as well as of the fact that they are under no obligation to receive care at these facilities.  PASRR submitted to EDS on 12/21/15     PASRR number received on 12/23/15     Existing PASRR number confirmed on       FL2 transmitted to all facilities in geographic area requested by pt/family on 12/21/15     FL2 transmitted to all facilities within larger geographic area on       Patient informed that his/her managed care company has contracts with or will negotiate with certain facilities, including the following:        Yes   Patient/family informed of bed offers received.  Patient chooses bed at  [facility])     Physician recommends and patient chooses bed at      Patient to be transferred to  [facility]) on 12/23/15.  Patient to be transferred to facility by  [facility] )     Patient family notified on 12/23/15 of transfer.  Name of family member notified:   (Adrian left voicemial with APS. )     PHYSICIAN       Additional  Comment:    _______________________________________________ Loralyn Freshwater, LCSW 12/23/2015, 11:07 AM

## 2015-12-23 NOTE — Progress Notes (Signed)
Patient is medically stable for D/C to [facility] today. Per [facility] admissions coordinator at [facility] patient is going to room 87-B. Clinical Education officer, museum (CSW) prepared D/C packet and faxed D/C Summary to facility. CSW spoke with Caryl Pina at facility who reported that patient can come today. Patient is aware of above. RN will call report and arrange EMS for transport. RN has agreed to send an extra gown with patient. CSW left a voicemail for APS worker [name] making her aware of above. Please reconsult if future social work needs arise. CSW signing off.   Blima Rich, [place] [phone]

## 2015-12-23 NOTE — Discharge Instructions (Signed)
°  DIET:  Diabetic diet, cardiac diet  DISCHARGE CONDITION:  Stable  ACTIVITY:  Activity as tolerated, pt eval and treat  OXYGEN:  Home Oxygen: No.   Oxygen Delivery: room air  DISCHARGE LOCATION:  nursing home    ADDITIONAL DISCHARGE INSTRUCTION:   If you experience worsening of your admission symptoms, develop shortness of breath, life threatening emergency, suicidal or homicidal thoughts you must seek medical attention immediately by calling 911 or calling your MD immediately  if symptoms less severe.  You Must read complete instructions/literature along with all the possible adverse reactions/side effects for all the Medicines you take and that have been prescribed to you. Take any new Medicines after you have completely understood and accpet all the possible adverse reactions/side effects.   Please note  You were cared for by a hospitalist during your hospital stay. If you have any questions about your discharge medications or the care you received while you were in the hospital after you are discharged, you can call the unit and asked to speak with the hospitalist on call if the hospitalist that took care of you is not available. Once you are discharged, your primary care physician will handle any further medical issues. Please note that NO REFILLS for any discharge medications will be authorized once you are discharged, as it is imperative that you return to your primary care physician (or establish a relationship with a primary care physician if you do not have one) for your aftercare needs so that they can reassess your need for medications and monitor your lab values.

## 2015-12-23 NOTE — NC FL2 (Signed)
[place] LEVEL OF CARE SCREENING TOOL     IDENTIFICATION  Patient Name: [name] Birthdate: 03-04-1966 Sex: female Admission Date (Current Location): 12/20/2015  [facility] and [place] Number:  Selena Lesser  ([id number]) Facility and Address:  [facility], [address] [place], [place] [address]      Provider Number: [id number]  Attending Physician Name and Address:  Dustin Flock, MD  Relative Name and Phone Number:       Current Level of Care: Hospital Recommended Level of Care: [place] Prior Approval Number:    Date Approved/Denied:   PASRR Number:    Discharge Plan: SNF    Current Diagnoses: Patient Active Problem List   Diagnosis Date Noted  . Sepsis ([place]) 12/20/2015  . UTI (lower urinary tract infection) 12/20/2015  . COPD (chronic obstructive pulmonary disease) ([place]) 12/20/2015  . GERD (gastroesophageal reflux disease) 12/20/2015  . Type 2 diabetes mellitus ([place]) 02/25/2014  . HTN (hypertension) 02/25/2014  . Cigarette smoker 02/25/2014  . Obesity, unspecified 02/25/2014  . Hx of ischemic right MCA stroke, 1998 02/25/2014  . CVA (cerebral infarction) 02/20/2014    Orientation RESPIRATION BLADDER Height & Weight    Self, Time, Situation, Place  Normal Incontinent 4' (121.9 cm) 114 lbs.  BEHAVIORAL SYMPTOMS/MOOD NEUROLOGICAL BOWEL NUTRITION STATUS   (none )  (none ) Continent Diet (Diet: Heart Healthy/ Carb Modified )  AMBULATORY STATUS COMMUNICATION OF NEEDS Skin   Extensive Assist Verbally Other (Comment) (Bilateral Above knee leg amputation)                       Personal Care Assistance Level of Assistance  Bathing, Feeding, Dressing Bathing Assistance: Limited assistance Feeding assistance: Independent Dressing Assistance: Limited assistance     Functional Limitations Info  Sight, Hearing, Speech Sight Info: Adequate Hearing Info: Adequate Speech Info: Adequate    SPECIAL  CARE FACTORS FREQUENCY  PT (By licensed PT), OT (By licensed OT)     PT Frequency:  (5) OT Frequency:  (5)            Contractures      Additional Factors Info  Code Status, Allergies, Insulin Sliding Scale Code Status Info:  (Full Code. ) Allergies Info:  (Ace Inhibitors, Flexeril Cyclobenzaprine, Paxil Paroxetine Hcl)   Insulin Sliding Scale Info:  (Novolog Insulin Injections 3 times daily )       Current Medications (12/23/2015):  This is the current hospital active medication list Current Facility-Administered Medications  Medication Dose Route Frequency Provider Last Rate Last Dose  . acetaminophen (TYLENOL) tablet 650 mg  650 mg Oral Q6H PRN Lance Coon, MD       Or  . acetaminophen (TYLENOL) suppository 650 mg  650 mg Rectal Q6H PRN Lance Coon, MD      . apixaban Arne Cleveland) tablet 5 mg  5 mg Oral BID Lance Coon, MD   5 mg at 12/23/15 [id number]  . cefUROXime (CEFTIN) tablet 500 mg  500 mg Oral BID WC Dustin Flock, MD   500 mg at 12/23/15 0924  . docusate sodium (COLACE) capsule 100 mg  100 mg Oral Daily PRN Dustin Flock, MD      . DULoxetine (CYMBALTA) DR capsule 60 mg  60 mg Oral BID Lance Coon, MD   60 mg at 12/23/15 [id number]  . furosemide (LASIX) tablet 10 mg  10 mg Oral BID Dustin Flock, MD   10 mg at 12/23/15 0924  . gabapentin (NEURONTIN) capsule 300  mg  300 mg Oral TID Dustin Flock, MD   300 mg at 12/23/15 [id number]  . insulin aspart (novoLOG) injection 0-5 Units  0-5 Units Subcutaneous QHS Lance Coon, MD   3 Units at 12/20/15 2354  . insulin aspart (novoLOG) injection 0-9 Units  0-9 Units Subcutaneous TID WC Lance Coon, MD   1 Units at 12/23/15 [id number]  . insulin glargine (LANTUS) injection 30 Units  30 Units Subcutaneous Daily Dustin Flock, MD   30 Units at 12/23/15 [phone]  . ipratropium-albuterol (DUONEB) 0.5-2.5 (3) MG/3ML nebulizer solution 3 mL  3 mL Nebulization Q4H PRN Lance Coon, MD      . levothyroxine (SYNTHROID, LEVOTHROID) tablet 25 mcg  25 mcg Oral  QAC breakfast Lance Coon, MD   25 mcg at 12/23/15 [id number]  . ondansetron (ZOFRAN) tablet 4 mg  4 mg Oral Q6H PRN Lance Coon, MD       Or  . ondansetron [facility]) injection 4 mg  4 mg Intravenous Q6H PRN Lance Coon, MD   4 mg at 12/22/15 1558  . oxyCODONE (Oxy IR/ROXICODONE) immediate release tablet 10 mg  10 mg Oral Q4H PRN Dustin Flock, MD   10 mg at 12/22/15 2148  . rosuvastatin (CRESTOR) tablet 20 mg  20 mg Oral Daily Lance Coon, MD   20 mg at 12/23/15 [id number]  . sodium chloride 0.9 % injection 3 mL  3 mL Intravenous Q12H Lance Coon, MD   3 mL at 12/23/15 [id number]  . zolpidem (AMBIEN) tablet 5 mg  5 mg Oral QHS PRN Lance Coon, MD   5 mg at 12/22/15 2148     Discharge Medications: Please see discharge summary for a list of discharge medications.  Relevant Imaging Results:  Relevant Lab Results:   Additional Information  (SSN: [id number])  Loralyn Freshwater, LCSW

## 2015-12-23 NOTE — Progress Notes (Signed)
Patient discharged to [facility] via ems. Called report to [place]. Patient discharge package sent via ems with prescription. IV was removed with no signs of infection. Dressing clean, dry intact. No skin tears or wounds present. No further needs from care management team.

## 2015-12-23 NOTE — Discharge Summary (Signed)
[name], 49 y.o., DOB 1966/05/14, MRN [id number]. Admission date: 12/20/2015 Discharge Date 12/23/2015 Primary MD Pcp Not In System Admitting Physician Lance Coon, MD  Admission Diagnosis  UTI (lower urinary tract infection) [N39.0] Sepsis ([place]) [A41.9]  Discharge Diagnosis   Principal Problem: Sepsis ([name]) ruled out Hypotension now resolved Type 2 diabetes mellitus ([place]) HTN (hypertension) UTI (lower urinary tract infection) COPD (chronic obstructive pulmonary disease) (HCC) GERD (gastroesophageal reflux disease) History of CVA Coronary artery disease with MI COPD GERD Anemia Anxiety Status post bilateral Harrold is a 49 y.o. female who presents with anxiety, difficulty caring herself. Patient has significant medical history, as below, with bilateral AKA. She recently has had difficulty caring for herself, one of her primary caregivers has been jailed, and the other one has been unable to care for her at night for stretches of 8-12 hours at a time. Patient called EMS to be brought in and evaluated. Patient in the emergency room was noted to have hypotension and urinary tract infection. Initially she was thought to have sepsis related to urinary tract infection however her blood cultures and urine cultures have been negative. Patient is doing much better blood hypotension resolved she is very weak and deconditioned and currently being discharged to rehabilitation.            Consults  None  Significant Tests:  See full reports for all details      Dg Chest 1 View  12/21/2015  CLINICAL DATA:  Sepsis EXAM: CHEST 1 VIEW COMPARISON:  None. FINDINGS: Normal heart size and mediastinal contours. No acute infiltrate or edema. No effusion or pneumothorax. No acute osseous findings. IMPRESSION: No active disease. Electronically Signed   By: Monte Fantasia M.D.   On: 12/21/2015 06:04   US Venous Img Lower Unilateral Right  12/15/2015   CLINICAL DATA:  Right leg pain and swelling for 1 month. History of pain, edema, tobacco use, and aspirin therapy. Right above the knee amputation. EXAM: LOWER EXTREMITY VENOUS DOPPLER ULTRASOUND TECHNIQUE: Gray-scale sonography with graded compression, as well as color Doppler and duplex ultrasound were performed to evaluate the lower extremity deep venous systems from the level of the common femoral vein and including the common femoral, femoral, profunda femoral, popliteal and calf veins including the posterior tibial, peroneal and gastrocnemius veins when visible. The superficial great saphenous vein was also interrogated. Spectral Doppler was utilized to evaluate flow at rest and with distal augmentation maneuvers in the common femoral, femoral and popliteal veins. COMPARISON:  None. FINDINGS: Contralateral Common Femoral Vein: Respiratory phasicity is normal and symmetric with the symptomatic side. No evidence of thrombus. Normal compressibility. Common Femoral Vein: No evidence of thrombus. Normal compressibility, respiratory phasicity and response to augmentation. Saphenofemoral Junction: No evidence of thrombus. Normal compressibility and flow on color Doppler imaging. Profunda Femoral Vein: No evidence of thrombus. Normal compressibility and flow on color Doppler imaging. Femoral Vein: Limited visualization. Small vein diameter. No thrombus demonstrated where visualized. Popliteal Vein: Surgically absent. Calf Veins: Surgically absent. Venous Reflux:  None. Other Findings:  Right above the knee amputation. IMPRESSION: No evidence of deep venous thrombosis. Right above-the-knee amputation. Electronically Signed   By: Lucienne Capers M.D.   On: 12/15/2015 22:44       Today   Subjective:   Yakeline Grajewski  feeling well denies any complaints  Objective:   Blood pressure 102/67, pulse 102, temperature 98.8 F (37.1 C), temperature source Oral, resp. rate  24, height 4' (1.219 m), weight 52.073 kg  (114 lb 12.8 oz), SpO2 100 %.  .  Intake/Output Summary (Last 24 hours) at 12/23/15 0957 Last data filed at 12/23/15 0659  Gross per 24 hour  Intake    240 ml  Output    500 ml  Net   -260 ml    Exam VITAL SIGNS: Blood pressure 102/67, pulse 102, temperature 98.8 F (37.1 C), temperature source Oral, resp. rate 24, height 4' (1.219 m), weight 52.073 kg (114 lb 12.8 oz), SpO2 100 %.  GENERAL:  49 y.o.-year-old patient lying in the bed with no acute distress.  EYES: Pupils equal, round, reactive to light and accommodation. No scleral icterus. Extraocular muscles intact.  HEENT: Head atraumatic, normocephalic. Oropharynx and nasopharynx clear.  NECK:  Supple, no jugular venous distention. No thyroid enlargement, no tenderness.  LUNGS: Normal breath sounds bilaterally, no wheezing, rales,rhonchi or crepitation. No use of accessory muscles of respiration.  CARDIOVASCULAR: S1, S2 normal. No murmurs, rubs, or gallops.  ABDOMEN: Soft, nontender, nondistended. Bowel sounds present. No organomegaly or mass.  EXTREMITIES: No pedal edema, cyanosis, or clubbing. Bilateral AKA  NEUROLOGIC: Cranial nerves II through XII are intact. Muscle strength 5/5 in all extremities. Sensation intact. Gait not checked.  PSYCHIATRIC: The patient is alert and oriented x 3.  SKIN: No obvious rash, lesion, or ulcer.   Data Review     CBC w Diff: Lab Results  Component Value Date   WBC 9.9 12/21/2015   HGB 14.4 12/21/2015   HCT 42.3 12/21/2015   PLT 190 12/21/2015   LYMPHOPCT 26% 12/20/2015   MONOPCT 4% 12/20/2015   EOSPCT 0% 12/20/2015   BASOPCT 0% 12/20/2015   CMP: Lab Results  Component Value Date   NA 133* 12/22/2015   K 4.2 12/22/2015   CL 100* 12/22/2015   CO2 28 12/22/2015   BUN 11 12/22/2015   CREATININE 0.37* 12/22/2015   PROT 7.5 12/20/2015   ALBUMIN 4.1 12/20/2015   BILITOT 0.9 12/20/2015   ALKPHOS 93 12/20/2015   AST 22 12/20/2015   ALT 16 12/20/2015  .  Micro Results Recent  Results (from the past 240 hour(s))  Urine culture     Status: None   Collection Time: 12/15/15  7:46 PM  Result Value Ref Range Status   Specimen Description URINE, RANDOM  Final   Special Requests NONE  Final   Culture NO GROWTH 2 DAYS  Final   Report Status 12/18/2015 FINAL  Final  Urine culture     Status: None   Collection Time: 12/20/15  7:39 PM  Result Value Ref Range Status   Specimen Description URINE, RANDOM  Final   Special Requests NONE  Final   Culture NO GROWTH 2 DAYS  Final   Report Status 12/22/2015 FINAL  Final  Culture, blood (routine x 2)     Status: None (Preliminary result)   Collection Time: 12/20/15  9:16 PM  Result Value Ref Range Status   Specimen Description BLOOD RIGHT HAND  Final   Special Requests BOTTLES DRAWN AEROBIC AND ANAEROBIC 5ML  Final   Culture NO GROWTH 3 DAYS  Final   Report Status PENDING  Incomplete  Culture, blood (routine x 2)     Status: None (Preliminary result)   Collection Time: 12/20/15  9:33 PM  Result Value Ref Range Status   Specimen Description BLOOD LEFT WRIST  Final   Special Requests BOTTLES DRAWN AEROBIC AND ANAEROBIC 5ML  Final   Culture  NO GROWTH 3 DAYS  Final   Report Status PENDING  Incomplete        Code Status Orders        Start     Ordered   12/20/15 2317  Full code   Continuous     12/20/15 2316          Follow-up Information    Follow up with [facility] .   Specialty:  [place] information:   Mystic Appling [phone]      Discharge Medications     Medication List    STOP taking these medications        isosorbide mononitrate 60 MG 24 hr tablet  Commonly known as:  IMDUR      TAKE these medications        apixaban 5 MG Tabs tablet  Commonly known as:  ELIQUIS  Take 5 mg by mouth 2 (two) times daily.     cefUROXime 500 MG tablet  Commonly known as:  CEFTIN  Take 1 tablet (500 mg total) by mouth 2  (two) times daily with a meal.     digoxin 0.125 MG tablet  Commonly known as:  LANOXIN  Take 0.125 mg by mouth 3 (three) times a week.     docusate sodium 100 MG capsule  Commonly known as:  COLACE  Take 1 capsule (100 mg total) by mouth daily as needed for mild constipation.     DULoxetine 60 MG capsule  Commonly known as:  CYMBALTA  Take 60 mg by mouth 2 (two) times daily.     fentaNYL 50 MCG/HR  Commonly known as:  DURAGESIC - dosed mcg/hr  Place 1 patch (50 mcg total) onto the skin every 3 (three) days.     gabapentin 300 MG capsule  Commonly known as:  NEURONTIN  Take 300 mg by mouth 3 (three) times daily.     insulin glargine 100 UNIT/ML injection  Commonly known as:  LANTUS  Inject 30 Units into the skin daily.     levothyroxine 25 MCG tablet  Commonly known as:  SYNTHROID, LEVOTHROID  Take 25 mcg by mouth daily before breakfast.     lubiprostone 24 MCG capsule  Commonly known as:  AMITIZA  Take 24 mcg by mouth 2 (two) times daily with a meal.     nitroGLYCERIN 0.4 MG SL tablet  Commonly known as:  NITROSTAT  Place 0.4 mg under the tongue every 5 (five) minutes as needed for chest pain.     Oxycodone HCl 10 MG Tabs  Take 1 tablet (10 mg total) by mouth every 4 (four) hours as needed for moderate pain.     rosuvastatin 20 MG tablet  Commonly known as:  CRESTOR  Take 20 mg by mouth daily.           Total Time in preparing paper work, data evaluation and todays exam - 35 minutes  Dustin Flock M.D on 12/23/2015 at 9:57 AM  [facility]   Office  [phone]

## 2015-12-25 LAB — CULTURE, BLOOD (ROUTINE X 2)
Culture: NO GROWTH
Culture: NO GROWTH

## 2016-10-08 IMAGING — CR DG CHEST 1V
1 series · 1 of 1 positions shown · non-contrast
Comparison: None.

CLINICAL DATA: Sepsis

EXAM:
CHEST 1 VIEW

[portable]
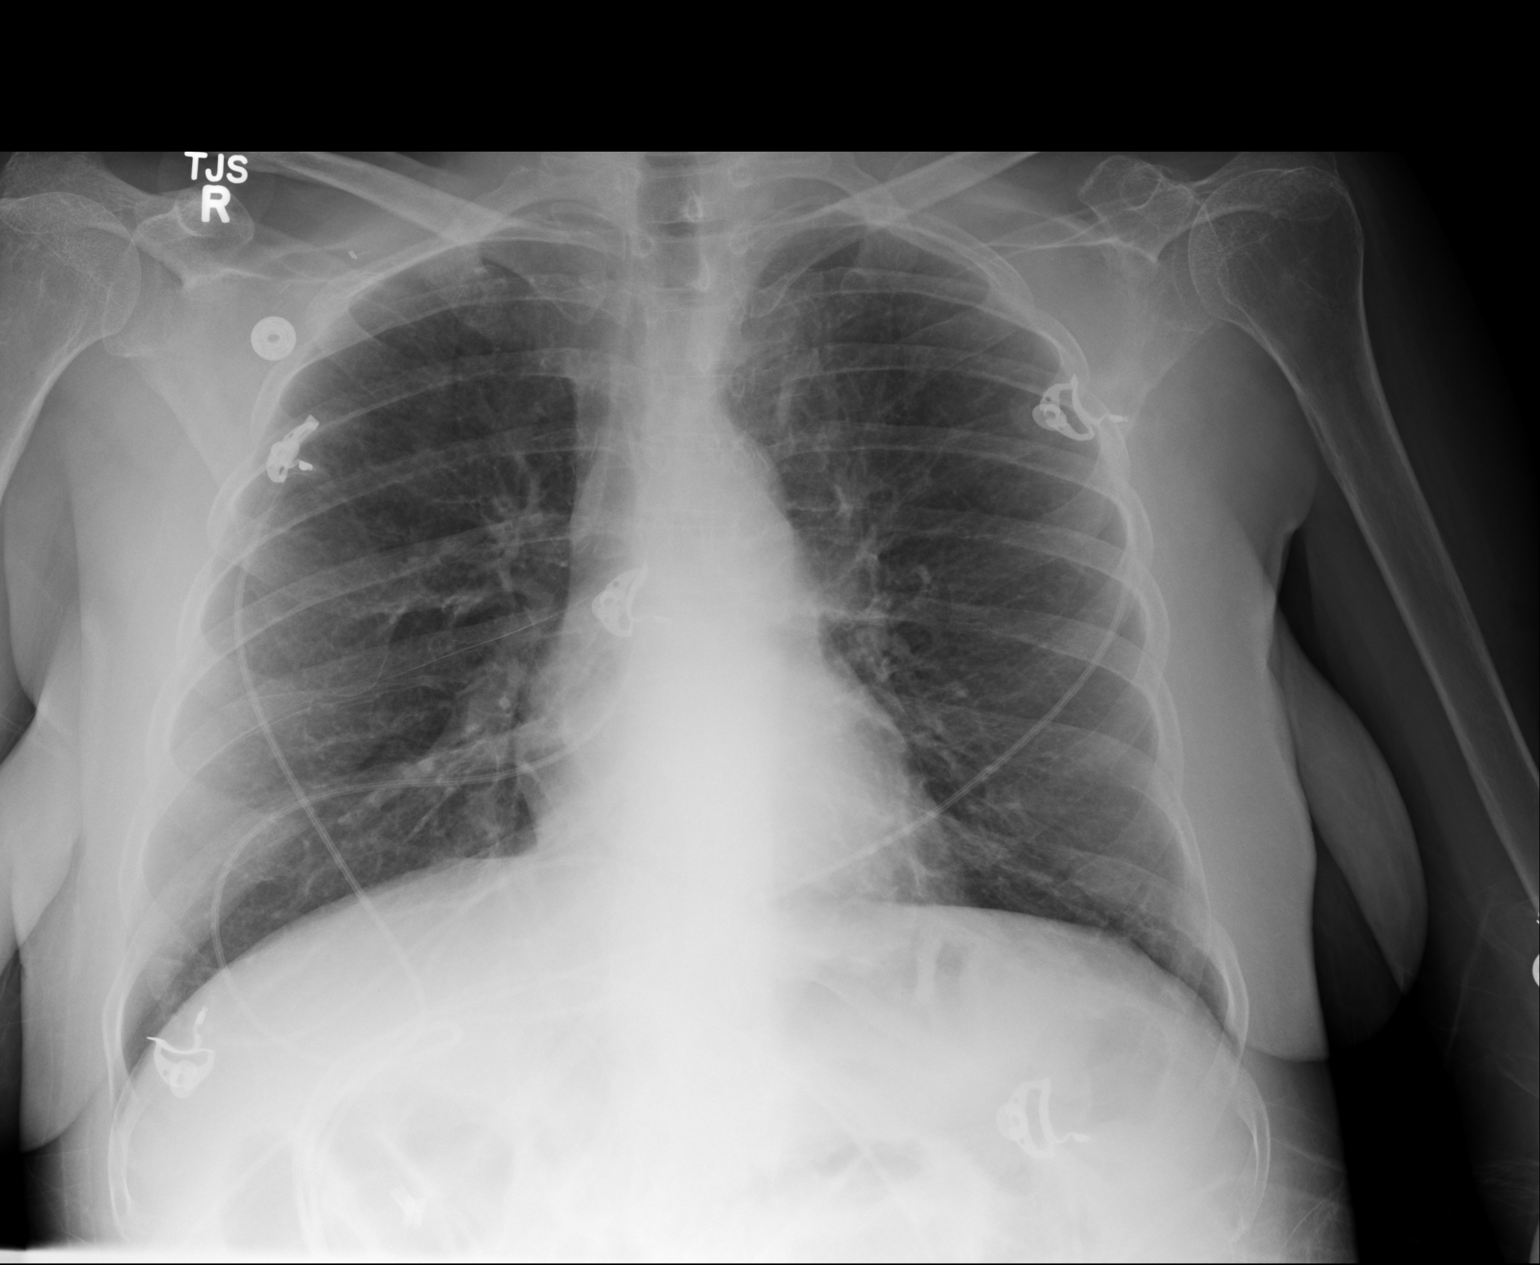

[1 of 1 positions shown; findings below may reference images not displayed]

FINDINGS: Normal heart size and mediastinal contours. No acute infiltrate or
edema. No effusion or pneumothorax. No acute osseous findings.
IMPRESSION: No active disease.

## 2016-10-14 ENCOUNTER — Inpatient Hospital Stay
Admission: AD | Admit: 2016-10-14 | Discharge: 2016-11-15 | Disposition: A | Discharge status: Skilled Nursing Facility | Payer: Medicare Other | Source: Ambulatory Visit | Attending: Internal Medicine | Admitting: Internal Medicine

## 2016-10-14 ENCOUNTER — Institutional Professional Consult (permissible substitution) (HOSPITAL_COMMUNITY): Payer: Self-pay

## 2016-10-14 DIAGNOSIS — F17210 Nicotine dependence, cigarettes, uncomplicated: Secondary | ICD-10-CM | POA: Diagnosis present

## 2016-10-14 DIAGNOSIS — A419 Sepsis, unspecified organism: Secondary | ICD-10-CM | POA: Diagnosis present

## 2016-10-14 DIAGNOSIS — I5022 Chronic systolic (congestive) heart failure: Secondary | ICD-10-CM | POA: Diagnosis present

## 2016-10-14 DIAGNOSIS — K5649 Other impaction of intestine: Secondary | ICD-10-CM

## 2016-10-14 DIAGNOSIS — Z8673 Personal history of transient ischemic attack (TIA), and cerebral infarction without residual deficits: Secondary | ICD-10-CM

## 2016-10-14 DIAGNOSIS — K219 Gastro-esophageal reflux disease without esophagitis: Secondary | ICD-10-CM | POA: Diagnosis present

## 2016-10-14 DIAGNOSIS — I639 Cerebral infarction, unspecified: Secondary | ICD-10-CM | POA: Diagnosis present

## 2016-10-14 DIAGNOSIS — I252 Old myocardial infarction: Secondary | ICD-10-CM

## 2016-10-14 DIAGNOSIS — I10 Essential (primary) hypertension: Secondary | ICD-10-CM | POA: Diagnosis present

## 2016-10-14 DIAGNOSIS — E119 Type 2 diabetes mellitus without complications: Secondary | ICD-10-CM

## 2016-10-14 DIAGNOSIS — J449 Chronic obstructive pulmonary disease, unspecified: Secondary | ICD-10-CM | POA: Diagnosis present

## 2016-10-14 DIAGNOSIS — E669 Obesity, unspecified: Secondary | ICD-10-CM | POA: Diagnosis present

## 2016-10-14 DIAGNOSIS — I236 Thrombosis of atrium, auricular appendage, and ventricle as current complications following acute myocardial infarction: Secondary | ICD-10-CM | POA: Diagnosis present

## 2016-10-14 DIAGNOSIS — R000 Tachycardia, unspecified: Secondary | ICD-10-CM | POA: Diagnosis present

## 2016-10-14 DIAGNOSIS — E1169 Type 2 diabetes mellitus with other specified complication: Secondary | ICD-10-CM

## 2016-10-14 DIAGNOSIS — I255 Ischemic cardiomyopathy: Secondary | ICD-10-CM | POA: Diagnosis present

## 2016-10-14 LAB — VANCOMYCIN, TROUGH: VANCOMYCIN TR: 16 ug/mL (ref 15–20)

## 2016-10-15 LAB — COMPREHENSIVE METABOLIC PANEL
ALT: 12 U/L — ABNORMAL LOW (ref 14–54)
ANION GAP: 5 (ref 5–15)
AST: 20 U/L (ref 15–41)
Albumin: 1.4 g/dL — ABNORMAL LOW (ref 3.5–5.0)
Alkaline Phosphatase: 69 U/L (ref 38–126)
BUN: 6 mg/dL (ref 6–20)
CHLORIDE: 101 mmol/L (ref 101–111)
CO2: 30 mmol/L (ref 22–32)
Calcium: 7.9 mg/dL — ABNORMAL LOW (ref 8.9–10.3)
Creatinine, Ser: <0.3 mg/dL — ABNORMAL LOW (ref 0.44–1.00)
Glucose, Bld: 84 mg/dL (ref 65–99)
POTASSIUM: 4.4 mmol/L (ref 3.5–5.1)
Sodium: 136 mmol/L (ref 135–145)
TOTAL PROTEIN: 4.4 g/dL — AB (ref 6.5–8.1)
Total Bilirubin: 0.4 mg/dL (ref 0.3–1.2)

## 2016-10-15 LAB — CBC WITH DIFFERENTIAL/PLATELET
BASOS ABS: 0 10*3/uL (ref 0.0–0.1)
Basophils Relative: 0 %
EOS ABS: 0.2 10*3/uL (ref 0.0–0.7)
Eosinophils Relative: 2 %
HCT: 23.9 % — ABNORMAL LOW (ref 36.0–46.0)
Hemoglobin: 7.9 g/dL — ABNORMAL LOW (ref 12.0–15.0)
LYMPHS ABS: 2.9 10*3/uL (ref 0.7–4.0)
Lymphocytes Relative: 27 %
MCH: 30.5 pg (ref 26.0–34.0)
MCHC: 33.1 g/dL (ref 30.0–36.0)
MCV: 92.3 fL (ref 78.0–100.0)
MONO ABS: 0.7 10*3/uL (ref 0.1–1.0)
Monocytes Relative: 6 %
NEUTROS PCT: 65 %
Neutro Abs: 7.1 10*3/uL (ref 1.7–7.7)
PLATELETS: 327 10*3/uL (ref 150–400)
RBC: 2.59 MIL/uL — AB (ref 3.87–5.11)
RDW: 14.6 % (ref 11.5–15.5)
WBC: 10.9 10*3/uL — AB (ref 4.0–10.5)

## 2016-10-15 LAB — TSH: TSH: 7.787 u[IU]/mL — AB (ref 0.350–4.500)

## 2016-10-15 LAB — PROTIME-INR
INR: 1.11
PROTHROMBIN TIME: 14.3 s (ref 11.4–15.2)

## 2016-10-15 LAB — MAGNESIUM: MAGNESIUM: 1.9 mg/dL (ref 1.7–2.4)

## 2016-10-15 LAB — VANCOMYCIN, TROUGH: VANCOMYCIN TR: 12 ug/mL — AB (ref 15–20)

## 2016-10-15 LAB — PHOSPHORUS: PHOSPHORUS: 3.5 mg/dL (ref 2.5–4.6)

## 2016-10-15 LAB — PROCALCITONIN

## 2016-10-16 LAB — BASIC METABOLIC PANEL
ANION GAP: 8 (ref 5–15)
BUN: 7 mg/dL (ref 6–20)
CALCIUM: 8 mg/dL — AB (ref 8.9–10.3)
CO2: 29 mmol/L (ref 22–32)
Chloride: 99 mmol/L — ABNORMAL LOW (ref 101–111)
Creatinine, Ser: <0.3 mg/dL — ABNORMAL LOW (ref 0.44–1.00)
GLUCOSE: 84 mg/dL (ref 65–99)
POTASSIUM: 3.8 mmol/L (ref 3.5–5.1)
SODIUM: 136 mmol/L (ref 135–145)

## 2016-10-16 LAB — CBC WITH DIFFERENTIAL/PLATELET
BASOS ABS: 0 10*3/uL (ref 0.0–0.1)
Basophils Relative: 0 %
EOS ABS: 0.2 10*3/uL (ref 0.0–0.7)
EOS PCT: 2 %
HCT: 22.9 % — ABNORMAL LOW (ref 36.0–46.0)
Hemoglobin: 7.5 g/dL — ABNORMAL LOW (ref 12.0–15.0)
LYMPHS ABS: 2.3 10*3/uL (ref 0.7–4.0)
Lymphocytes Relative: 22 %
MCH: 30.2 pg (ref 26.0–34.0)
MCHC: 32.8 g/dL (ref 30.0–36.0)
MCV: 92.3 fL (ref 78.0–100.0)
MONO ABS: 0.7 10*3/uL (ref 0.1–1.0)
Monocytes Relative: 7 %
NEUTROS PCT: 69 %
Neutro Abs: 7.1 10*3/uL (ref 1.7–7.7)
PLATELETS: 370 10*3/uL (ref 150–400)
RBC: 2.48 MIL/uL — AB (ref 3.87–5.11)
RDW: 15.2 % (ref 11.5–15.5)
WBC: 10.3 10*3/uL (ref 4.0–10.5)

## 2016-10-16 LAB — HEMOGLOBIN A1C
HEMOGLOBIN A1C: 9.4 % — AB (ref 4.8–5.6)
MEAN PLASMA GLUCOSE: 223 mg/dL

## 2016-10-16 LAB — T4, FREE: FREE T4: 1.24 ng/dL — AB (ref 0.61–1.12)

## 2016-10-17 LAB — VANCOMYCIN, TROUGH: Vancomycin Tr: 18 ug/mL (ref 15–20)

## 2016-10-18 LAB — CBC WITH DIFFERENTIAL/PLATELET
Basophils Absolute: 0 10*3/uL (ref 0.0–0.1)
Basophils Relative: 0 %
EOS ABS: 0.3 10*3/uL (ref 0.0–0.7)
EOS PCT: 3 %
HCT: 27.6 % — ABNORMAL LOW (ref 36.0–46.0)
Hemoglobin: 8.8 g/dL — ABNORMAL LOW (ref 12.0–15.0)
LYMPHS ABS: 2.2 10*3/uL (ref 0.7–4.0)
Lymphocytes Relative: 23 %
MCH: 30.3 pg (ref 26.0–34.0)
MCHC: 31.9 g/dL (ref 30.0–36.0)
MCV: 95.2 fL (ref 78.0–100.0)
MONOS PCT: 5 %
Monocytes Absolute: 0.5 10*3/uL (ref 0.1–1.0)
Neutro Abs: 6.7 10*3/uL (ref 1.7–7.7)
Neutrophils Relative %: 69 %
PLATELETS: 411 10*3/uL — AB (ref 150–400)
RBC: 2.9 MIL/uL — AB (ref 3.87–5.11)
RDW: 15.8 % — ABNORMAL HIGH (ref 11.5–15.5)
WBC: 9.7 10*3/uL (ref 4.0–10.5)

## 2016-10-18 LAB — BASIC METABOLIC PANEL
Anion gap: 5 (ref 5–15)
BUN: 8 mg/dL (ref 6–20)
CALCIUM: 8.3 mg/dL — AB (ref 8.9–10.3)
CO2: 31 mmol/L (ref 22–32)
Chloride: 102 mmol/L (ref 101–111)
Glucose, Bld: 92 mg/dL (ref 65–99)
Potassium: 3.8 mmol/L (ref 3.5–5.1)
SODIUM: 138 mmol/L (ref 135–145)

## 2016-10-18 LAB — VANCOMYCIN, TROUGH: Vancomycin Tr: 50 ug/mL (ref 15–20)

## 2016-10-18 LAB — PHOSPHORUS: Phosphorus: 3.9 mg/dL (ref 2.5–4.6)

## 2016-10-18 LAB — MAGNESIUM: MAGNESIUM: 1.9 mg/dL (ref 1.7–2.4)

## 2016-10-18 LAB — T4, FREE: Free T4: 0.97 ng/dL (ref 0.61–1.12)

## 2016-10-18 LAB — TSH: TSH: 4.783 u[IU]/mL — ABNORMAL HIGH (ref 0.350–4.500)

## 2016-10-19 LAB — CULTURE, BLOOD (ROUTINE X 2)
Culture: NO GROWTH
Culture: NO GROWTH

## 2016-10-19 LAB — VANCOMYCIN, TROUGH: VANCOMYCIN TR: 5 ug/mL — AB (ref 15–20)

## 2016-10-21 LAB — CBC WITH DIFFERENTIAL/PLATELET
BASOS ABS: 0 10*3/uL (ref 0.0–0.1)
Basophils Relative: 0 %
EOS PCT: 2 %
Eosinophils Absolute: 0.3 10*3/uL (ref 0.0–0.7)
HCT: 28.1 % — ABNORMAL LOW (ref 36.0–46.0)
Hemoglobin: 9 g/dL — ABNORMAL LOW (ref 12.0–15.0)
LYMPHS ABS: 2.5 10*3/uL (ref 0.7–4.0)
LYMPHS PCT: 19 %
MCH: 30.2 pg (ref 26.0–34.0)
MCHC: 32 g/dL (ref 30.0–36.0)
MCV: 94.3 fL (ref 78.0–100.0)
MONO ABS: 0.6 10*3/uL (ref 0.1–1.0)
MONOS PCT: 5 %
Neutro Abs: 9.4 10*3/uL — ABNORMAL HIGH (ref 1.7–7.7)
Neutrophils Relative %: 74 %
PLATELETS: 456 10*3/uL — AB (ref 150–400)
RBC: 2.98 MIL/uL — ABNORMAL LOW (ref 3.87–5.11)
RDW: 16.1 % — AB (ref 11.5–15.5)
WBC: 12.7 10*3/uL — ABNORMAL HIGH (ref 4.0–10.5)

## 2016-10-21 LAB — BASIC METABOLIC PANEL
Anion gap: 9 (ref 5–15)
BUN: 10 mg/dL (ref 6–20)
CALCIUM: 8.6 mg/dL — AB (ref 8.9–10.3)
CO2: 28 mmol/L (ref 22–32)
Chloride: 96 mmol/L — ABNORMAL LOW (ref 101–111)
GLUCOSE: 148 mg/dL — AB (ref 65–99)
Potassium: 4.1 mmol/L (ref 3.5–5.1)
Sodium: 133 mmol/L — ABNORMAL LOW (ref 135–145)

## 2016-10-21 LAB — PHOSPHORUS: Phosphorus: 3.7 mg/dL (ref 2.5–4.6)

## 2016-10-21 LAB — MAGNESIUM: Magnesium: 1.6 mg/dL — ABNORMAL LOW (ref 1.7–2.4)

## 2016-10-21 LAB — VANCOMYCIN, TROUGH: VANCOMYCIN TR: 9 ug/mL — AB (ref 15–20)

## 2016-10-22 LAB — CBC
HCT: 25.9 % — ABNORMAL LOW (ref 36.0–46.0)
Hemoglobin: 8.1 g/dL — ABNORMAL LOW (ref 12.0–15.0)
MCH: 30.3 pg (ref 26.0–34.0)
MCHC: 31.3 g/dL (ref 30.0–36.0)
MCV: 97 fL (ref 78.0–100.0)
PLATELETS: 396 10*3/uL (ref 150–400)
RBC: 2.67 MIL/uL — AB (ref 3.87–5.11)
RDW: 16.7 % — ABNORMAL HIGH (ref 11.5–15.5)
WBC: 11.5 10*3/uL — ABNORMAL HIGH (ref 4.0–10.5)

## 2016-10-22 LAB — BASIC METABOLIC PANEL
Anion gap: 8 (ref 5–15)
BUN: 10 mg/dL (ref 6–20)
CHLORIDE: 98 mmol/L — AB (ref 101–111)
CO2: 26 mmol/L (ref 22–32)
Calcium: 8.4 mg/dL — ABNORMAL LOW (ref 8.9–10.3)
GLUCOSE: 123 mg/dL — AB (ref 65–99)
POTASSIUM: 4.1 mmol/L (ref 3.5–5.1)
Sodium: 132 mmol/L — ABNORMAL LOW (ref 135–145)

## 2016-10-22 LAB — VANCOMYCIN, TROUGH: Vancomycin Tr: 20 ug/mL (ref 15–20)

## 2016-10-22 LAB — MAGNESIUM: Magnesium: 1.7 mg/dL (ref 1.7–2.4)

## 2016-10-22 LAB — PHOSPHORUS: Phosphorus: 3.6 mg/dL (ref 2.5–4.6)

## 2016-10-23 LAB — CBC WITH DIFFERENTIAL/PLATELET
Basophils Absolute: 0 10*3/uL (ref 0.0–0.1)
Basophils Relative: 0 %
Eosinophils Absolute: 0.3 10*3/uL (ref 0.0–0.7)
Eosinophils Relative: 2 %
HEMATOCRIT: 27.7 % — AB (ref 36.0–46.0)
Hemoglobin: 8.7 g/dL — ABNORMAL LOW (ref 12.0–15.0)
LYMPHS ABS: 3 10*3/uL (ref 0.7–4.0)
LYMPHS PCT: 23 %
MCH: 30.7 pg (ref 26.0–34.0)
MCHC: 31.4 g/dL (ref 30.0–36.0)
MCV: 97.9 fL (ref 78.0–100.0)
MONOS PCT: 6 %
Monocytes Absolute: 0.8 10*3/uL (ref 0.1–1.0)
NEUTROS PCT: 69 %
Neutro Abs: 8.8 10*3/uL — ABNORMAL HIGH (ref 1.7–7.7)
PLATELETS: 363 10*3/uL (ref 150–400)
RBC: 2.83 MIL/uL — ABNORMAL LOW (ref 3.87–5.11)
RDW: 16.9 % — AB (ref 11.5–15.5)
WBC: 12.9 10*3/uL — AB (ref 4.0–10.5)

## 2016-10-23 LAB — BASIC METABOLIC PANEL
ANION GAP: 8 (ref 5–15)
BUN: 12 mg/dL (ref 6–20)
CALCIUM: 9 mg/dL (ref 8.9–10.3)
CO2: 31 mmol/L (ref 22–32)
CREATININE: 0.31 mg/dL — AB (ref 0.44–1.00)
Chloride: 97 mmol/L — ABNORMAL LOW (ref 101–111)
Glucose, Bld: 108 mg/dL — ABNORMAL HIGH (ref 65–99)
Potassium: 3.9 mmol/L (ref 3.5–5.1)
SODIUM: 136 mmol/L (ref 135–145)

## 2016-10-23 LAB — MAGNESIUM: MAGNESIUM: 1.7 mg/dL (ref 1.7–2.4)

## 2016-10-23 LAB — PHOSPHORUS: PHOSPHORUS: 4.2 mg/dL (ref 2.5–4.6)

## 2016-10-24 DIAGNOSIS — Z8673 Personal history of transient ischemic attack (TIA), and cerebral infarction without residual deficits: Secondary | ICD-10-CM

## 2016-10-24 DIAGNOSIS — I236 Thrombosis of atrium, auricular appendage, and ventricle as current complications following acute myocardial infarction: Secondary | ICD-10-CM | POA: Diagnosis present

## 2016-10-24 DIAGNOSIS — I5022 Chronic systolic (congestive) heart failure: Secondary | ICD-10-CM | POA: Diagnosis not present

## 2016-10-24 DIAGNOSIS — J449 Chronic obstructive pulmonary disease, unspecified: Secondary | ICD-10-CM

## 2016-10-24 DIAGNOSIS — I255 Ischemic cardiomyopathy: Secondary | ICD-10-CM | POA: Diagnosis not present

## 2016-10-24 DIAGNOSIS — F17210 Nicotine dependence, cigarettes, uncomplicated: Secondary | ICD-10-CM | POA: Diagnosis not present

## 2016-10-24 DIAGNOSIS — I213 ST elevation (STEMI) myocardial infarction of unspecified site: Secondary | ICD-10-CM

## 2016-10-24 DIAGNOSIS — R000 Tachycardia, unspecified: Secondary | ICD-10-CM | POA: Diagnosis present

## 2016-10-24 DIAGNOSIS — I513 Intracardiac thrombosis, not elsewhere classified: Secondary | ICD-10-CM

## 2016-10-24 DIAGNOSIS — I252 Old myocardial infarction: Secondary | ICD-10-CM

## 2016-10-24 LAB — CBC
HEMATOCRIT: 26.6 % — AB (ref 36.0–46.0)
Hemoglobin: 8.3 g/dL — ABNORMAL LOW (ref 12.0–15.0)
MCH: 30.1 pg (ref 26.0–34.0)
MCHC: 31.2 g/dL (ref 30.0–36.0)
MCV: 96.4 fL (ref 78.0–100.0)
Platelets: 364 10*3/uL (ref 150–400)
RBC: 2.76 MIL/uL — ABNORMAL LOW (ref 3.87–5.11)
RDW: 16.9 % — AB (ref 11.5–15.5)
WBC: 12.4 10*3/uL — ABNORMAL HIGH (ref 4.0–10.5)

## 2016-10-24 LAB — URINE MICROSCOPIC-ADD ON

## 2016-10-24 LAB — URINALYSIS, ROUTINE W REFLEX MICROSCOPIC
Bilirubin Urine: NEGATIVE
Glucose, UA: NEGATIVE mg/dL
Ketones, ur: NEGATIVE mg/dL
NITRITE: NEGATIVE
PH: 6 (ref 5.0–8.0)
Protein, ur: NEGATIVE mg/dL
SPECIFIC GRAVITY, URINE: 1.009 (ref 1.005–1.030)

## 2016-10-24 LAB — URINE CULTURE

## 2016-10-24 LAB — TYPE AND SCREEN
ABO/RH(D): B NEG
Antibody Screen: NEGATIVE

## 2016-10-24 LAB — VANCOMYCIN, TROUGH: Vancomycin Tr: 23 ug/mL (ref 15–20)

## 2016-10-24 LAB — ABO/RH: ABO/RH(D): B NEG
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
## 2016-10-25 LAB — CBC
HEMATOCRIT: 27.2 % — AB (ref 36.0–46.0)
HEMOGLOBIN: 8.5 g/dL — AB (ref 12.0–15.0)
MCH: 30.5 pg (ref 26.0–34.0)
MCHC: 31.3 g/dL (ref 30.0–36.0)
MCV: 97.5 fL (ref 78.0–100.0)
Platelets: 339 10*3/uL (ref 150–400)
RBC: 2.79 MIL/uL — ABNORMAL LOW (ref 3.87–5.11)
RDW: 17.2 % — ABNORMAL HIGH (ref 11.5–15.5)
WBC: 11.7 10*3/uL — ABNORMAL HIGH (ref 4.0–10.5)

## 2016-10-25 LAB — BASIC METABOLIC PANEL
Anion gap: 11 (ref 5–15)
BUN: 6 mg/dL (ref 6–20)
CHLORIDE: 98 mmol/L — AB (ref 101–111)
CO2: 28 mmol/L (ref 22–32)
Calcium: 8.9 mg/dL (ref 8.9–10.3)
GLUCOSE: 130 mg/dL — AB (ref 65–99)
Potassium: 3.6 mmol/L (ref 3.5–5.1)
Sodium: 137 mmol/L (ref 135–145)

## 2016-10-25 LAB — VANCOMYCIN, TROUGH: Vancomycin Tr: 5 ug/mL — ABNORMAL LOW (ref 15–20)

## 2016-10-25 LAB — PROTIME-INR
INR: 1.14
Prothrombin Time: 14.7 seconds (ref 11.4–15.2)

## 2016-10-26 LAB — BASIC METABOLIC PANEL
Anion gap: 7 (ref 5–15)
BUN: 10 mg/dL (ref 6–20)
CALCIUM: 8.1 mg/dL — AB (ref 8.9–10.3)
CHLORIDE: 102 mmol/L (ref 101–111)
CO2: 28 mmol/L (ref 22–32)
Glucose, Bld: 120 mg/dL — ABNORMAL HIGH (ref 65–99)
Potassium: 3.7 mmol/L (ref 3.5–5.1)
SODIUM: 137 mmol/L (ref 135–145)

## 2016-10-26 LAB — CBC
HCT: 26.1 % — ABNORMAL LOW (ref 36.0–46.0)
HEMOGLOBIN: 8 g/dL — AB (ref 12.0–15.0)
MCH: 30.1 pg (ref 26.0–34.0)
MCHC: 30.7 g/dL (ref 30.0–36.0)
MCV: 98.1 fL (ref 78.0–100.0)
PLATELETS: 379 10*3/uL (ref 150–400)
RBC: 2.66 MIL/uL — ABNORMAL LOW (ref 3.87–5.11)
RDW: 17.3 % — AB (ref 11.5–15.5)
WBC: 10.1 10*3/uL (ref 4.0–10.5)

## 2016-10-26 LAB — VANCOMYCIN, TROUGH: VANCOMYCIN TR: 17 ug/mL (ref 15–20)

## 2016-10-28 LAB — PROTIME-INR
INR: 1.04
PROTHROMBIN TIME: 13.6 s (ref 11.4–15.2)

## 2016-10-29 LAB — CBC WITH DIFFERENTIAL/PLATELET
BASOS ABS: 0 10*3/uL (ref 0.0–0.1)
BASOS PCT: 0 %
EOS ABS: 0.3 10*3/uL (ref 0.0–0.7)
Eosinophils Relative: 2 %
HEMATOCRIT: 25.7 % — AB (ref 36.0–46.0)
Hemoglobin: 8 g/dL — ABNORMAL LOW (ref 12.0–15.0)
Lymphocytes Relative: 20 %
Lymphs Abs: 2.2 10*3/uL (ref 0.7–4.0)
MCH: 30.7 pg (ref 26.0–34.0)
MCHC: 31.1 g/dL (ref 30.0–36.0)
MCV: 98.5 fL (ref 78.0–100.0)
MONO ABS: 0.5 10*3/uL (ref 0.1–1.0)
Monocytes Relative: 5 %
NEUTROS ABS: 8 10*3/uL — AB (ref 1.7–7.7)
NEUTROS PCT: 73 %
Platelets: 388 10*3/uL (ref 150–400)
RBC: 2.61 MIL/uL — ABNORMAL LOW (ref 3.87–5.11)
RDW: 17.1 % — AB (ref 11.5–15.5)
WBC: 10.9 10*3/uL — ABNORMAL HIGH (ref 4.0–10.5)

## 2016-10-29 LAB — BASIC METABOLIC PANEL
ANION GAP: 5 (ref 5–15)
BUN: 7 mg/dL (ref 6–20)
CALCIUM: 8.2 mg/dL — AB (ref 8.9–10.3)
CO2: 26 mmol/L (ref 22–32)
Chloride: 106 mmol/L (ref 101–111)
Glucose, Bld: 64 mg/dL — ABNORMAL LOW (ref 65–99)
Potassium: 3.4 mmol/L — ABNORMAL LOW (ref 3.5–5.1)
SODIUM: 137 mmol/L (ref 135–145)

## 2016-10-29 LAB — PROTIME-INR
INR: 1.05
PROTHROMBIN TIME: 13.7 s (ref 11.4–15.2)

## 2016-10-29 LAB — MAGNESIUM: MAGNESIUM: 1.6 mg/dL — AB (ref 1.7–2.4)

## 2016-10-29 LAB — PHOSPHORUS: PHOSPHORUS: 4 mg/dL (ref 2.5–4.6)

## 2016-10-30 LAB — CBC WITH DIFFERENTIAL/PLATELET
Basophils Absolute: 0 10*3/uL (ref 0.0–0.1)
Basophils Relative: 0 %
EOS PCT: 3 %
Eosinophils Absolute: 0.3 10*3/uL (ref 0.0–0.7)
HEMATOCRIT: 26.6 % — AB (ref 36.0–46.0)
Hemoglobin: 8.1 g/dL — ABNORMAL LOW (ref 12.0–15.0)
LYMPHS ABS: 3.4 10*3/uL (ref 0.7–4.0)
LYMPHS PCT: 25 %
MCH: 30 pg (ref 26.0–34.0)
MCHC: 30.5 g/dL (ref 30.0–36.0)
MCV: 98.5 fL (ref 78.0–100.0)
MONO ABS: 1 10*3/uL (ref 0.1–1.0)
MONOS PCT: 7 %
NEUTROS ABS: 8.9 10*3/uL — AB (ref 1.7–7.7)
Neutrophils Relative %: 65 %
PLATELETS: 505 10*3/uL — AB (ref 150–400)
RBC: 2.7 MIL/uL — ABNORMAL LOW (ref 3.87–5.11)
RDW: 17 % — AB (ref 11.5–15.5)
WBC: 13.7 10*3/uL — ABNORMAL HIGH (ref 4.0–10.5)

## 2016-10-30 LAB — BASIC METABOLIC PANEL
ANION GAP: 6 (ref 5–15)
BUN: 10 mg/dL (ref 6–20)
CALCIUM: 8.6 mg/dL — AB (ref 8.9–10.3)
CO2: 28 mmol/L (ref 22–32)
Chloride: 105 mmol/L (ref 101–111)
Creatinine, Ser: <0.3 mg/dL — ABNORMAL LOW (ref 0.44–1.00)
GLUCOSE: 51 mg/dL — AB (ref 65–99)
Potassium: 4.1 mmol/L (ref 3.5–5.1)
Sodium: 139 mmol/L (ref 135–145)

## 2016-10-30 LAB — CULTURE, BLOOD (ROUTINE X 2)
CULTURE: NO GROWTH
Culture: NO GROWTH

## 2016-10-30 LAB — PROTIME-INR
INR: 0.96
Prothrombin Time: 12.8 seconds (ref 11.4–15.2)

## 2016-10-30 LAB — DIGOXIN LEVEL

## 2016-10-30 LAB — MAGNESIUM: Magnesium: 1.8 mg/dL (ref 1.7–2.4)

## 2016-10-30 LAB — PHOSPHORUS: Phosphorus: 3.7 mg/dL (ref 2.5–4.6)

## 2016-10-31 LAB — BASIC METABOLIC PANEL
ANION GAP: 7 (ref 5–15)
BUN: 12 mg/dL (ref 6–20)
CALCIUM: 9 mg/dL (ref 8.9–10.3)
CO2: 28 mmol/L (ref 22–32)
Chloride: 102 mmol/L (ref 101–111)
GLUCOSE: 50 mg/dL — AB (ref 65–99)
Potassium: 3.8 mmol/L (ref 3.5–5.1)
Sodium: 137 mmol/L (ref 135–145)

## 2016-10-31 LAB — CBC
HCT: 29.1 % — ABNORMAL LOW (ref 36.0–46.0)
Hemoglobin: 8.8 g/dL — ABNORMAL LOW (ref 12.0–15.0)
MCH: 29.7 pg (ref 26.0–34.0)
MCHC: 30.2 g/dL (ref 30.0–36.0)
MCV: 98.3 fL (ref 78.0–100.0)
PLATELETS: 566 10*3/uL — AB (ref 150–400)
RBC: 2.96 MIL/uL — ABNORMAL LOW (ref 3.87–5.11)
RDW: 16.9 % — AB (ref 11.5–15.5)
WBC: 15.1 10*3/uL — AB (ref 4.0–10.5)

## 2016-10-31 LAB — PROTIME-INR
INR: 1.26
Prothrombin Time: 15.9 seconds — ABNORMAL HIGH (ref 11.4–15.2)

## 2016-11-01 LAB — VANCOMYCIN, TROUGH: VANCOMYCIN TR: 20 ug/mL (ref 15–20)

## 2016-11-01 LAB — PROTIME-INR
INR: 1.47
Prothrombin Time: 18 seconds — ABNORMAL HIGH (ref 11.4–15.2)

## 2016-11-02 LAB — PROTIME-INR
INR: 1.97
Prothrombin Time: 22.7 seconds — ABNORMAL HIGH (ref 11.4–15.2)

## 2016-11-03 LAB — PROTIME-INR
INR: 2.42
PROTHROMBIN TIME: 26.8 s — AB (ref 11.4–15.2)

## 2016-11-04 LAB — PROTIME-INR
INR: 2.99
Prothrombin Time: 31.7 s — ABNORMAL HIGH (ref 11.4–15.2)

## 2016-11-05 LAB — CBC
HEMATOCRIT: 27.5 % — AB (ref 36.0–46.0)
HEMOGLOBIN: 8.3 g/dL — AB (ref 12.0–15.0)
MCH: 29.4 pg (ref 26.0–34.0)
MCHC: 30.2 g/dL (ref 30.0–36.0)
MCV: 97.5 fL (ref 78.0–100.0)
Platelets: 459 10*3/uL — ABNORMAL HIGH (ref 150–400)
RBC: 2.82 MIL/uL — ABNORMAL LOW (ref 3.87–5.11)
RDW: 15.7 % — ABNORMAL HIGH (ref 11.5–15.5)
WBC: 9.1 10*3/uL (ref 4.0–10.5)

## 2016-11-05 LAB — BASIC METABOLIC PANEL
Anion gap: 10 (ref 5–15)
BUN: 10 mg/dL (ref 6–20)
CHLORIDE: 98 mmol/L — AB (ref 101–111)
CO2: 27 mmol/L (ref 22–32)
Calcium: 8.4 mg/dL — ABNORMAL LOW (ref 8.9–10.3)
GLUCOSE: 162 mg/dL — AB (ref 65–99)
Potassium: 3.7 mmol/L (ref 3.5–5.1)
SODIUM: 135 mmol/L (ref 135–145)

## 2016-11-05 LAB — PROTIME-INR
INR: 4.27
PROTHROMBIN TIME: 42.2 s — AB (ref 11.4–15.2)

## 2016-11-05 LAB — PHOSPHORUS: Phosphorus: 3.6 mg/dL (ref 2.5–4.6)

## 2016-11-05 LAB — DIGOXIN LEVEL: Digoxin Level: <0.2 ng/mL — ABNORMAL LOW (ref 0.8–2.0)

## 2016-11-05 LAB — MAGNESIUM: Magnesium: 1.7 mg/dL (ref 1.7–2.4)

## 2016-11-06 LAB — VANCOMYCIN, TROUGH: Vancomycin Tr: 18 ug/mL (ref 15–20)

## 2016-11-06 LAB — CBC
HEMATOCRIT: 27.8 % — AB (ref 36.0–46.0)
HEMOGLOBIN: 8.5 g/dL — AB (ref 12.0–15.0)
MCH: 29.6 pg (ref 26.0–34.0)
MCHC: 30.6 g/dL (ref 30.0–36.0)
MCV: 96.9 fL (ref 78.0–100.0)
Platelets: 527 10*3/uL — ABNORMAL HIGH (ref 150–400)
RBC: 2.87 MIL/uL — ABNORMAL LOW (ref 3.87–5.11)
RDW: 15.5 % (ref 11.5–15.5)
WBC: 11.5 10*3/uL — ABNORMAL HIGH (ref 4.0–10.5)

## 2016-11-06 LAB — PROTIME-INR
INR: 3.93
Prothrombin Time: 41.1 seconds — ABNORMAL HIGH (ref 11.4–15.2)

## 2016-11-07 LAB — PROTIME-INR
INR: 2.96
PROTHROMBIN TIME: 31.4 s — AB (ref 11.4–15.2)

## 2016-11-07 LAB — BASIC METABOLIC PANEL
Anion gap: 7 (ref 5–15)
BUN: 8 mg/dL (ref 6–20)
CHLORIDE: 99 mmol/L — AB (ref 101–111)
CO2: 27 mmol/L (ref 22–32)
Calcium: 8.8 mg/dL — ABNORMAL LOW (ref 8.9–10.3)
Creatinine, Ser: <0.3 mg/dL — ABNORMAL LOW (ref 0.44–1.00)
Glucose, Bld: 101 mg/dL — ABNORMAL HIGH (ref 65–99)
POTASSIUM: 4 mmol/L (ref 3.5–5.1)
SODIUM: 133 mmol/L — AB (ref 135–145)

## 2016-11-07 LAB — CBC
HCT: 27.3 % — ABNORMAL LOW (ref 36.0–46.0)
HEMOGLOBIN: 8.3 g/dL — AB (ref 12.0–15.0)
MCH: 29 pg (ref 26.0–34.0)
MCHC: 30.4 g/dL (ref 30.0–36.0)
MCV: 95.5 fL (ref 78.0–100.0)
Platelets: 498 10*3/uL — ABNORMAL HIGH (ref 150–400)
RBC: 2.86 MIL/uL — AB (ref 3.87–5.11)
RDW: 15.3 % (ref 11.5–15.5)
WBC: 10.6 10*3/uL — ABNORMAL HIGH (ref 4.0–10.5)

## 2016-11-07 LAB — MAGNESIUM: MAGNESIUM: 1.7 mg/dL (ref 1.7–2.4)

## 2016-11-07 LAB — PHOSPHORUS: PHOSPHORUS: 4.2 mg/dL (ref 2.5–4.6)

## 2016-11-08 LAB — PROTIME-INR
INR: 1.41
Prothrombin Time: 17.4 seconds — ABNORMAL HIGH (ref 11.4–15.2)

## 2016-11-09 LAB — BASIC METABOLIC PANEL
ANION GAP: 11 (ref 5–15)
BUN: 8 mg/dL (ref 6–20)
CO2: 24 mmol/L (ref 22–32)
Calcium: 8.7 mg/dL — ABNORMAL LOW (ref 8.9–10.3)
Chloride: 98 mmol/L — ABNORMAL LOW (ref 101–111)
Creatinine, Ser: <0.3 mg/dL — ABNORMAL LOW (ref 0.44–1.00)
GLUCOSE: 97 mg/dL (ref 65–99)
POTASSIUM: 3.6 mmol/L (ref 3.5–5.1)
Sodium: 133 mmol/L — ABNORMAL LOW (ref 135–145)

## 2016-11-09 LAB — PROTIME-INR
INR: 3.76
Prothrombin Time: 38.1 seconds — ABNORMAL HIGH (ref 11.4–15.2)

## 2016-11-10 LAB — PROTIME-INR
INR: 3.07
Prothrombin Time: 32.4 seconds — ABNORMAL HIGH (ref 11.4–15.2)

## 2016-11-10 LAB — HEMOGLOBIN A1C
HEMOGLOBIN A1C: 6 % — AB (ref 4.8–5.6)
Mean Plasma Glucose: 126 mg/dL

## 2016-11-10 LAB — VANCOMYCIN, TROUGH: VANCOMYCIN TR: 37 ug/mL — AB (ref 15–20)

## 2016-11-11 LAB — CBC WITH DIFFERENTIAL/PLATELET
BASOS PCT: 0 %
Basophils Absolute: 0 10*3/uL (ref 0.0–0.1)
EOS ABS: 0.2 10*3/uL (ref 0.0–0.7)
Eosinophils Relative: 2 %
HCT: 28.5 % — ABNORMAL LOW (ref 36.0–46.0)
Hemoglobin: 8.6 g/dL — ABNORMAL LOW (ref 12.0–15.0)
LYMPHS ABS: 2.3 10*3/uL (ref 0.7–4.0)
Lymphocytes Relative: 23 %
MCH: 28.4 pg (ref 26.0–34.0)
MCHC: 30.2 g/dL (ref 30.0–36.0)
MCV: 94.1 fL (ref 78.0–100.0)
MONO ABS: 0.7 10*3/uL (ref 0.1–1.0)
MONOS PCT: 8 %
Neutro Abs: 6.4 10*3/uL (ref 1.7–7.7)
Neutrophils Relative %: 67 %
Platelets: 481 10*3/uL — ABNORMAL HIGH (ref 150–400)
RBC: 3.03 MIL/uL — ABNORMAL LOW (ref 3.87–5.11)
RDW: 14.7 % (ref 11.5–15.5)
WBC: 9.6 10*3/uL (ref 4.0–10.5)

## 2016-11-11 LAB — PROTIME-INR
INR: 2.6
PROTHROMBIN TIME: 28.3 s — AB (ref 11.4–15.2)

## 2016-11-12 LAB — PROTIME-INR
INR: 1.5
Prothrombin Time: 18.3 seconds — ABNORMAL HIGH (ref 11.4–15.2)

## 2016-11-12 LAB — VANCOMYCIN, TROUGH: Vancomycin Tr: 19 ug/mL (ref 15–20)

## 2016-11-13 LAB — PROTIME-INR
INR: 1.43
Prothrombin Time: 17.6 seconds — ABNORMAL HIGH (ref 11.4–15.2)

## 2016-11-13 LAB — VANCOMYCIN, TROUGH
VANCOMYCIN TR: 37 ug/mL — AB (ref 15–20)
Vancomycin Tr: 8 ug/mL — ABNORMAL LOW (ref 15–20)

## 2016-11-14 LAB — PROTIME-INR
INR: 1.89
Prothrombin Time: 22 seconds — ABNORMAL HIGH (ref 11.4–15.2)

## 2016-11-15 LAB — URINALYSIS, ROUTINE W REFLEX MICROSCOPIC
Bilirubin Urine: NEGATIVE
Glucose, UA: NEGATIVE mg/dL
Ketones, ur: NEGATIVE mg/dL
NITRITE: NEGATIVE
PROTEIN: NEGATIVE mg/dL
SPECIFIC GRAVITY, URINE: 1.007 (ref 1.005–1.030)
pH: 7 (ref 5.0–8.0)

## 2016-11-15 LAB — URINE MICROSCOPIC-ADD ON

## 2016-11-15 LAB — PROTIME-INR
INR: 2.37
Prothrombin Time: 26.3 seconds — ABNORMAL HIGH (ref 11.4–15.2)

## 2016-11-16 LAB — URINE CULTURE

## 2018-09-04 ENCOUNTER — Emergency Department (HOSPITAL_COMMUNITY)
Admission: EM | Admit: 2018-09-04 | Discharge: 2018-09-05 | Disposition: A | Discharge status: Home / Self Care | Payer: Medicare Other | Attending: Emergency Medicine | Admitting: Emergency Medicine

## 2018-09-04 ENCOUNTER — Other Ambulatory Visit: Payer: Self-pay

## 2018-09-04 ENCOUNTER — Encounter (HOSPITAL_COMMUNITY): Payer: Self-pay

## 2018-09-04 DIAGNOSIS — J449 Chronic obstructive pulmonary disease, unspecified: Secondary | ICD-10-CM | POA: Diagnosis not present

## 2018-09-04 DIAGNOSIS — Z7901 Long term (current) use of anticoagulants: Secondary | ICD-10-CM | POA: Insufficient documentation

## 2018-09-04 DIAGNOSIS — E11628 Type 2 diabetes mellitus with other skin complications: Secondary | ICD-10-CM | POA: Diagnosis not present

## 2018-09-04 DIAGNOSIS — Z8673 Personal history of transient ischemic attack (TIA), and cerebral infarction without residual deficits: Secondary | ICD-10-CM | POA: Insufficient documentation

## 2018-09-04 DIAGNOSIS — I110 Hypertensive heart disease with heart failure: Secondary | ICD-10-CM | POA: Insufficient documentation

## 2018-09-04 DIAGNOSIS — L89151 Pressure ulcer of sacral region, stage 1: Secondary | ICD-10-CM | POA: Insufficient documentation

## 2018-09-04 DIAGNOSIS — I5022 Chronic systolic (congestive) heart failure: Secondary | ICD-10-CM | POA: Insufficient documentation

## 2018-09-04 DIAGNOSIS — Z794 Long term (current) use of insulin: Secondary | ICD-10-CM | POA: Diagnosis not present

## 2018-09-04 DIAGNOSIS — L89159 Pressure ulcer of sacral region, unspecified stage: Secondary | ICD-10-CM | POA: Diagnosis present

## 2018-09-04 DIAGNOSIS — Z79899 Other long term (current) drug therapy: Secondary | ICD-10-CM | POA: Insufficient documentation

## 2018-09-04 DIAGNOSIS — F419 Anxiety disorder, unspecified: Secondary | ICD-10-CM | POA: Diagnosis not present

## 2018-09-04 DIAGNOSIS — Z89612 Acquired absence of left leg above knee: Secondary | ICD-10-CM | POA: Diagnosis not present

## 2018-09-04 DIAGNOSIS — I252 Old myocardial infarction: Secondary | ICD-10-CM | POA: Insufficient documentation

## 2018-09-04 DIAGNOSIS — F17210 Nicotine dependence, cigarettes, uncomplicated: Secondary | ICD-10-CM | POA: Diagnosis not present

## 2018-09-04 LAB — CBG MONITORING, ED: Glucose-Capillary: 244 mg/dL — ABNORMAL HIGH (ref 70–99)

## 2018-09-04 NOTE — Discharge Instructions (Signed)
Your wounds are healing well, continue to do dressing changes and follow-up with your family doctor

## 2018-09-04 NOTE — ED Provider Notes (Signed)
[facility] EMERGENCY DEPARTMENT Provider Note   CSN: [id number] Arrival date & time: 09/04/18  2132     History   Chief Complaint Chief Complaint  Patient presents with  . Sacral wound    HPI Deb Loudin is a 52 y.o. female.  HPI  52 year old female, she has a known history of diabetes, COPD, she has had vascular disease and has had bilateral lower extremity amputations above the knee.  She is currently living in a nursing facility close to this hospital, she is upset because of the dressing changes, she wanted to have a second opinion this evening about her wounds, she has no other complaints.  No fevers, no vomiting.  Past Medical History:  Diagnosis Date  . Anemia   . Anxiety   . Asthma   . Cigarette smoker 02/25/2014  . COPD (chronic obstructive pulmonary disease) ([place])   . Diabetes mellitus without complication ([place])   . GERD (gastroesophageal reflux disease)   . Hx of ischemic right MCA stroke 02/25/2014  . Hypertension   . MI (myocardial infarction) ([place])   . Obesity, unspecified 02/25/2014  . Stroke [facility])     Patient Active Problem List   Diagnosis Date Noted  . History of acute anterior wall MI 10/24/2016  . 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 Chronic systolic congestive heart failure ([place]) 10/24/2016  . Cardiomyopathy, ischemic 10/24/2016  . Sinus tachycardia 10/24/2016  . Sepsis ([place]) 12/20/2015  . UTI (lower urinary tract infection) 12/20/2015  . COPD (chronic obstructive pulmonary disease) ([place]) 12/20/2015  . GERD (gastroesophageal reflux disease) 12/20/2015  . Type 2 diabetes mellitus ([place]) 02/25/2014  . HTN (hypertension) 02/25/2014  . Cigarette smoker 02/25/2014  . Obesity 02/25/2014  . Hx of ischemic right MCA stroke, 1998 02/25/2014  . Cerebral infarction ([place]) 02/20/2014    Past Surgical History:  Procedure Laterality Date  . ABOVE KNEE LEG AMPUTATION Bilateral   . ANGIOPLASTY       OB History   None       Home Medications    Prior to Admission medications   Medication Sig Start Date End Date Taking? Authorizing Provider  apixaban (ELIQUIS) 5 MG TABS tablet Take 5 mg by mouth 2 (two) times daily.    [provider]  cefUROXime (CEFTIN) 500 MG tablet Take 1 tablet (500 mg total) by mouth 2 (two) times daily with a meal. 12/23/15   Dustin Flock, MD  digoxin (LANOXIN) 0.125 MG tablet Take 0.125 mg by mouth 3 (three) times a week.     [provider]  docusate sodium (COLACE) 100 MG capsule Take 1 capsule (100 mg total) by mouth daily as needed for mild constipation. 12/23/15   Dustin Flock, MD  DULoxetine (CYMBALTA) 60 MG capsule Take 60 mg by mouth 2 (two) times daily.    [provider]  fentaNYL (DURAGESIC - DOSED MCG/HR) 50 MCG/HR Place 1 patch (50 mcg total) onto the skin every 3 (three) days. 12/23/15   Dustin Flock, MD  gabapentin (NEURONTIN) 300 MG capsule Take 300 mg by mouth 3 (three) times daily.    [provider]  insulin glargine (LANTUS) 100 UNIT/ML injection Inject 30 Units into the skin daily.     [provider]  levothyroxine (SYNTHROID, LEVOTHROID) 25 MCG tablet Take 25 mcg by mouth daily before breakfast.    [provider]  lubiprostone (AMITIZA) 24 MCG capsule Take 24 mcg by mouth 2 (two) times daily with a meal.  [provider]  nitroGLYCERIN (NITROSTAT) 0.4 MG SL tablet Place 0.4 mg under the tongue every 5 (five) minutes as needed for chest pain.    [provider]  oxyCODONE 10 MG TABS Take 1 tablet (10 mg total) by mouth every 4 (four) hours as needed for moderate pain. 12/23/15   Dustin Flock, MD  rosuvastatin (CRESTOR) 20 MG tablet Take 20 mg by mouth daily.    [provider]    Family History Family History  Problem Relation Age of Onset  . Vascular Disease Unknown     Social History Social History   Tobacco Use  . Smoking status: Current Every Day Smoker     Packs/day: 0.50    Types: Cigarettes  . Smokeless tobacco: Never Used  Substance Use Topics  . Alcohol use: No  . Drug use: No     Allergies   Ace inhibitors; Flexeril [cyclobenzaprine]; and Paxil [paroxetine hcl]   Review of Systems Review of Systems  Constitutional: Negative for fever.  Skin: Positive for wound.     Physical Exam Updated Vital Signs BP 121/80 (BP Location: Right Arm)   Pulse (!) 104   Temp (!) 97.1 F (36.2 C) (Oral)   Resp 18   Ht 1.219 m (4')   Wt 58.9 kg   SpO2 95%   BMI 39.62 kg/m   Physical Exam  Constitutional: She appears well-developed and well-nourished.  HENT:  Head: Normocephalic and atraumatic.  Eyes: Conjunctivae are normal. Right eye exhibits no discharge. Left eye exhibits no discharge.  Pulmonary/Chest: Effort normal. No respiratory distress.  Musculoskeletal:  Chaperone present for exam, patient rolled into the left lateral decubitus position, sacral area was evaluated, the patient has very good healing sacral decubitus ulcers without any drainage, any purulence any foul smell, any surrounding redness or any tenderness.  This looks extremely good, she has no other signs of skin breakdown on her back or sacral area.  The lateral stumps appear to be well-healed  Neurological: She is alert. Coordination normal.  Normal mental status, follows commands without difficulty, able to roll herself onto her side  Skin: Skin is warm and dry. No rash noted. She is not diaphoretic. No erythema.  Psychiatric: She has a normal mood and affect.  Nursing note and vitals reviewed.    ED Treatments / Results  Labs (all labs ordered are listed, but only abnormal results are displayed) Labs Reviewed  CBG MONITORING, ED - Abnormal; Notable for the following components:      Result Value   Glucose-Capillary 244 (*)    All other components within normal limits    EKG None  Radiology No results found.  Procedures Procedures (including  critical care time)  Medications Ordered in ED Medications - No data to display   Initial Impression / Assessment and Plan / ED Course  I have reviewed the triage vital signs and the nursing notes.  Pertinent labs & imaging results that were available during my care of the patient were reviewed by me and considered in my medical decision making (see chart for details).     Well-appearing, with ulcers appear to be healing, no indication for further evaluation or intervention this evening  Dressing replaced prior to transfer back to her SNF  Final Clinical Impressions(s) / ED Diagnoses   Final diagnoses:  Pressure injury of sacral region, stage 1      Noemi Chapel, MD 09/04/18 2155

## 2018-09-04 NOTE — ED Triage Notes (Signed)
Pt has a sacral wound that is being cared for with dressing changes.  Pt states she got upset with the staff at [facility] for using up her cleaning supplies instead of using the facility supplies.   Pt told ems that she just wanted the wound to be looked at to be sure it was healing properly.  Pt states it is somewhat more painful and they are not turning her as she needs.

## 2018-09-04 NOTE — ED Notes (Signed)
Call to [facility] for dressing

## 2018-09-04 NOTE — ED Notes (Signed)
Pt report from EMS and patient  Pt resides at [facility] - has been there since July from a facility in [facility] where she reports she was well cared for  [facility] called when pt became upset that staff was using her cleaning care products rather than the facility's products. Pt states, "AS much as I pay, they should be using their stuff- Also upset that she has a sacral wound that is covered by 2 diapers and she is concerned and wants it "looked at"- see wound note by B Mariea Clonts RN, CN-  Pt reports that ECF hired a wound care specialist today - she has wound dressing applied and wound has no drainage and is clean Pt is a bilateral lower extremity amputee and is post stroke with L arm flacid

## 2018-09-05 NOTE — ED Notes (Signed)
EMS here to pick up

## 2018-11-16 ENCOUNTER — Other Ambulatory Visit: Payer: Self-pay

## 2018-11-16 DIAGNOSIS — L909 Atrophic disorder of skin, unspecified: Secondary | ICD-10-CM

## 2018-11-16 DIAGNOSIS — L539 Erythematous condition, unspecified: Secondary | ICD-10-CM

## 2018-12-07 ENCOUNTER — Ambulatory Visit (HOSPITAL_COMMUNITY)
Admission: RE | Admit: 2018-12-07 | Discharge: 2018-12-07 | Disposition: A | Discharge status: Home / Self Care | Payer: Medicare Other | Source: Ambulatory Visit | Attending: Vascular Surgery | Admitting: Vascular Surgery

## 2018-12-07 ENCOUNTER — Ambulatory Visit (INDEPENDENT_AMBULATORY_CARE_PROVIDER_SITE_OTHER): Payer: Medicare Other | Admitting: Vascular Surgery

## 2018-12-07 ENCOUNTER — Encounter: Payer: Self-pay | Admitting: Vascular Surgery

## 2018-12-07 VITALS — BP 129/68 | HR 82 | Temp 98.0°F | Resp 18

## 2018-12-07 DIAGNOSIS — L539 Erythematous condition, unspecified: Secondary | ICD-10-CM

## 2018-12-07 DIAGNOSIS — L909 Atrophic disorder of skin, unspecified: Secondary | ICD-10-CM

## 2018-12-07 NOTE — Progress Notes (Signed)
Vascular and Vein Specialist of [facility]  Patient name: [name] MRN: [id number] DOB: 1966/06/24 Sex: female  REASON FOR CONSULT: Evaluation of erythema left hand  HPI: [name] is a 52 y.o. female, who is here today for evaluation of erythema in her left hand.  She is here today with nursing assistant from her skilled nursing facility.  He has a complex past history.  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  She reports that she cannot feel her left hand and denies any pain due to this.  She did have a remote history of axillary to bifemoral bypass in [facility] which is known to be failed.  She does have bilateral above-knee amputations many years ago.  She is in a wheelchair.  We are seeing her due to some erythema on her tips of her left hand and some thickening of her fingernails on the left hand.  He does not have any history of infection in her left hand.  She does have a long history of diabetes and also cigarette smoker.  Past Medical History:  Diagnosis Date  . Anemia   . Anxiety   . Asthma   . Cigarette smoker 02/25/2014  . COPD (chronic obstructive pulmonary disease) ([place])   . Diabetes mellitus without complication ([place])   . GERD (gastroesophageal reflux disease)   . Hx of ischemic right MCA stroke 02/25/2014  . Hypertension   . MI (myocardial infarction) ([place])   . Obesity, unspecified 02/25/2014  . Stroke [facility])     Family History  Problem Relation Age of Onset  . Vascular Disease Unknown     SOCIAL HISTORY: Social History   Socioeconomic History  . Marital status: Single    Spouse name: Not on file  . Number of children: Not on file  . Years of education: Not on file  . Highest education level: Not on file  Occupational History  . Not on file  Social Needs  . Financial resource strain: Not on file  . Food insecurity:    Worry: Not on file    Inability: Not on file  .  Transportation needs:    Medical: Not on file    Non-medical: Not on file  Tobacco Use  . Smoking status: Current Every Day Smoker    Packs/day: 0.50    Types: Cigarettes  . Smokeless tobacco: Never Used  Substance and Sexual Activity  . Alcohol use: No  . Drug use: No  . Sexual activity: Not on file  Lifestyle  . Physical activity:    Days per week: Not on file    Minutes per session: Not on file  . Stress: Not on file  Relationships  . Social connections:    Talks on phone: Not on file    Gets together: Not on file    Attends religious service: Not on file    Active member of club or organization: Not on file    Attends meetings of clubs or organizations: Not on file    Relationship status: Not on file  . Intimate partner violence:    Fear of current or ex partner: Not on file    Emotionally abused: Not on file    Physically abused: Not on file    Forced sexual activity: Not on file  Other Topics Concern  . Not on file  Social History Narrative  . Not on file    Allergies  Allergen Reactions  .  Ace Inhibitors   . Flexeril [Cyclobenzaprine]   . Paxil [Paroxetine Hcl]     Current Outpatient Medications  Medication Sig Dispense Refill  . apixaban (ELIQUIS) 5 MG TABS tablet Take 5 mg by mouth 2 (two) times daily.    . cefUROXime (CEFTIN) 500 MG tablet Take 1 tablet (500 mg total) by mouth 2 (two) times daily with a meal. 6 tablet 0  . digoxin (LANOXIN) 0.125 MG tablet Take 0.125 mg by mouth 3 (three) times a week.     . docusate sodium (COLACE) 100 MG capsule Take 1 capsule (100 mg total) by mouth daily as needed for mild constipation. 10 capsule 0  . DULoxetine (CYMBALTA) 60 MG capsule Take 60 mg by mouth 2 (two) times daily.    . fentaNYL (DURAGESIC - DOSED MCG/HR) 50 MCG/HR Place 1 patch (50 mcg total) onto the skin every 3 (three) days. 5 patch 0  . gabapentin (NEURONTIN) 300 MG capsule Take 300 mg by mouth 3 (three) times daily.    . insulin glargine (LANTUS)  100 UNIT/ML injection Inject 30 Units into the skin daily.     Marland Kitchen levothyroxine (SYNTHROID, LEVOTHROID) 25 MCG tablet Take 25 mcg by mouth daily before breakfast.    . lubiprostone (AMITIZA) 24 MCG capsule Take 24 mcg by mouth 2 (two) times daily with a meal.    . nitroGLYCERIN (NITROSTAT) 0.4 MG SL tablet Place 0.4 mg under the tongue every 5 (five) minutes as needed for chest pain.    Marland Kitchen oxyCODONE 10 MG TABS Take 1 tablet (10 mg total) by mouth every 4 (four) hours as needed for moderate pain. 30 tablet 0  . rosuvastatin (CRESTOR) 20 MG tablet Take 20 mg by mouth daily.     No current facility-administered medications for this visit.     REVIEW OF SYSTEMS:  [X]  denotes positive finding, [ ]  denotes negative finding Cardiac  Comments:  Chest pain or chest pressure:    Shortness of breath upon exertion:    Short of breath when lying flat:    Irregular heart rhythm:        Vascular    Pain in calf, thigh, or hip brought on by ambulation:    Pain in feet at night that wakes you up from your sleep:     Blood clot in your veins:    Leg swelling:         Pulmonary    Oxygen at home:    Productive cough:     Wheezing:         Neurologic    Sudden weakness in arms or legs:  x   Sudden numbness in arms or legs:  x   Sudden onset of difficulty speaking or slurred speech:    Temporary loss of vision in one eye:     Problems with dizziness:         Gastrointestinal    Blood in stool:     Vomited blood:         Genitourinary    Burning when urinating:     Blood in urine:        Psychiatric    Major depression:         Hematologic    Bleeding problems:    Problems with blood clotting too easily:        Skin    Rashes or ulcers:        Constitutional    Fever or chills:  PHYSICAL EXAM: Vitals:   12/07/18 1316  BP: 129/68  Pulse: 82  Resp: 18  Temp: 98 F (36.7 C)  TempSrc: Temporal    GENERAL: The patient is a well-nourished female, in no acute distress. The  vital signs are documented above. CARDIOVASCULAR: 2+ brachial pulses bilaterally.  2+ right radial pulse.  She has a faint left radial pulse.  Good Doppler flow in her radial bilaterally.  Does have good flow in her fingers as well PULMONARY: There is good air exchange  ABDOMEN: Soft and non-tender  MUSCULOSKELETAL: Bilateral above-knee amputations NEUROLOGIC: Flaccid left arm SKIN: Erythema around her fingertips bilaterally more so on the left than on the right.  Thickening and discoloration of nailbeds and the fingernails on the left PSYCHIATRIC: The patient has a normal affect.  DATA:  Noninvasive vascular lab studies at [facility] reveal normal flow and waveforms bilaterally upper extremities  MEDICAL ISSUES: Discussed these findings in detail with the patient.  I do not see any evidence of arterial insufficiency because the skin changes around her nailbed.  She does appear to have adequate arterial flow and there is no suggestion of venous compromise.  Does have changes around the nailbeds of her right hand as well but is not quite to the same degree.  Not feel this has any effect from her old right axillofemoral bypass.  She will see [place] again on an as-needed basis   Rosetta Posner, MD [facility] Vascular and Vein Specialists of [facility] Tel [phone] Pager [phone]

## 2019-12-21 ENCOUNTER — Other Ambulatory Visit: Payer: Self-pay

## 2019-12-21 ENCOUNTER — Ambulatory Visit (INDEPENDENT_AMBULATORY_CARE_PROVIDER_SITE_OTHER): Payer: Medicare Other | Admitting: Vascular Surgery

## 2019-12-21 ENCOUNTER — Encounter: Payer: Self-pay | Admitting: Vascular Surgery

## 2019-12-21 VITALS — BP 103/70 | HR 100 | Temp 97.9°F | Resp 20 | Ht <= 58 in | Wt 129.0 lb

## 2019-12-21 DIAGNOSIS — M79606 Pain in leg, unspecified: Secondary | ICD-10-CM | POA: Diagnosis not present

## 2019-12-21 DIAGNOSIS — T8789 Other complications of amputation stump: Secondary | ICD-10-CM | POA: Diagnosis not present

## 2019-12-21 NOTE — Progress Notes (Signed)
Vascular and Vein Specialist of [facility]  Patient name: [name] MRN: [id number] DOB: 02-25-1966 Sex: female  REASON FOR VISIT: Evaluation pain and coolness in both above-knee amputation stump  HPI: [name] is a 53 y.o. female here for evaluation.  She is an extremely unfortunate 53 year old.  Had undergone treatment in [facility] with revascularization attempts including axillary to bifemoral bypasses.  All of this had failed and she ended up with bilateral above-knee amputations.  She presents today with concern regarding coolness and diminished sensation in her stumps bilaterally.  She also reports some pain associated with this.  Fortunately she has had no tissue loss.  She is in a wheelchair with lift from bed to chair.  Past Medical History:  Diagnosis Date  . Anemia   . Anxiety   . Asthma   . Cigarette smoker 02/25/2014  . COPD (chronic obstructive pulmonary disease) ([place])   . Diabetes mellitus without complication ([place])   . GERD (gastroesophageal reflux disease)   . Hx of ischemic right MCA stroke 02/25/2014  . Hypertension   . MI (myocardial infarction) ([place])   . Obesity, unspecified 02/25/2014  . Stroke [facility])     Family History  Problem Relation Age of Onset  . Vascular Disease Other     SOCIAL HISTORY: Social History   Tobacco Use  . Smoking status: Current Every Day Smoker    Packs/day: 0.50    Types: Cigarettes  . Smokeless tobacco: Never Used  Substance Use Topics  . Alcohol use: No    Allergies  Allergen Reactions  . Ace Inhibitors   . Flexeril [Cyclobenzaprine]   . Morphine Other (See Comments)    PO form causes reaction Other reaction(s): SHORTNESS OF BREATH PO form causes reaction Other reaction(s): SHORTNESS OF BREATH   . Paxil [Paroxetine Hcl]   . Silver Sulfadiazine Dermatitis    Current Outpatient Medications  Medication Sig Dispense Refill  . ALPRAZolam (XANAX) 0.5 MG tablet  Take by mouth.    . ARIPiprazole (ABILIFY) 5 MG tablet Take 5 mg by mouth daily.    . clopidogrel (PLAVIX) 75 MG tablet Take 75 mg by mouth daily.    . digoxin (LANOXIN) 0.125 MG tablet Take 0.125 mg by mouth 3 (three) times a week.     . divalproex (DEPAKOTE) 125 MG DR tablet     . docusate sodium (COLACE) 100 MG capsule Take 1 capsule (100 mg total) by mouth daily as needed for mild constipation. 10 capsule 0  . DULoxetine (CYMBALTA) 60 MG capsule Take 60 mg by mouth 2 (two) times daily.    . famotidine (PEPCID) 20 MG tablet Take by mouth.    . fenofibrate 160 MG tablet Take 160 mg by mouth daily.    Marland Kitchen gabapentin (NEURONTIN) 300 MG capsule Take 300 mg by mouth 3 (three) times daily.    . insulin glargine (LANTUS) 100 UNIT/ML injection Inject 30 Units into the skin daily.     Marland Kitchen levothyroxine (SYNTHROID, LEVOTHROID) 25 MCG tablet Take 25 mcg by mouth daily before breakfast.    . lubiprostone (AMITIZA) 24 MCG capsule Take 24 mcg by mouth 2 (two) times daily with a meal.    . nitroGLYCERIN (NITROSTAT) 0.4 MG SL tablet Place 0.4 mg under the tongue every 5 (five) minutes as needed for chest pain.    . rosuvastatin (CRESTOR) 20 MG tablet Take 20 mg by mouth daily.    . TRULICITY 1.5 0000000 SOPN     .  cefUROXime (CEFTIN) 500 MG tablet Take 1 tablet (500 mg total) by mouth 2 (two) times daily with a meal. (Patient not taking: Reported on 12/21/2019) 6 tablet 0  . fentaNYL (DURAGESIC - DOSED MCG/HR) 50 MCG/HR Place 1 patch (50 mcg total) onto the skin every 3 (three) days. (Patient not taking: Reported on 12/21/2019) 5 patch 0   No current facility-administered medications for this visit.    REVIEW OF SYSTEMS:  [X]  denotes positive finding, [ ]  denotes negative finding Cardiac  Comments:  Chest pain or chest pressure:    Shortness of breath upon exertion:    Short of breath when lying flat:    Irregular heart rhythm:        Vascular    Pain in calf, thigh, or hip brought on by ambulation:     Pain in feet at night that wakes you up from your sleep:     Blood clot in your veins:    Leg swelling:           PHYSICAL EXAM: Vitals:   12/21/19 0857  BP: 103/70  Pulse: 100  Resp: 20  Temp: 97.9 F (36.6 C)  SpO2: 95%  Weight: 129 lb (58.5 kg)  Height: 4' (1.219 m)    GENERAL: The patient is a well-nourished female, in no acute distress. The vital signs are documented above. CARDIOVASCULAR: I do not palpate femoral pulses bilaterally. PULMONARY: There is good air exchange  MUSCULOSKELETAL: There are no major deformities or cyanosis. NEUROLOGIC: No focal weakness or paresthesias are detected. SKIN: There are no ulcers or rashes noted.  There is no mottling or skin loss on her above-knee amputations bilaterally.  Do not feel cool.   PSYCHIATRIC: The patient has a normal affect.  DATA:  None  MEDICAL ISSUES: Difficult problem regarding pain and coolness in her above-knee amputation stump.  I explained that fortunately she has had no tissue loss.  There would not be any possible option for revascularization should she develop progressive ischemia.  Only option would be higher level of amputation and even hip disarticulation.  Certainly no indication for this currently.  She will see [place] on an as-needed basis    Rosetta Posner, MD [facility] Vascular and Vein Specialists of [facility] Tel [phone] Pager [phone]

## 2020-01-04 ENCOUNTER — Ambulatory Visit: Payer: Medicare Other | Admitting: Vascular Surgery

## 2020-03-05 ENCOUNTER — Encounter (HOSPITAL_COMMUNITY): Payer: Self-pay | Admitting: *Deleted

## 2020-03-05 ENCOUNTER — Other Ambulatory Visit: Payer: Self-pay

## 2020-03-05 ENCOUNTER — Emergency Department (HOSPITAL_COMMUNITY)
Admission: EM | Admit: 2020-03-05 | Discharge: 2020-03-06 | Disposition: A | Discharge status: Home / Self Care | Payer: Medicare Other | Attending: Emergency Medicine | Admitting: Emergency Medicine

## 2020-03-05 ENCOUNTER — Emergency Department (HOSPITAL_COMMUNITY): Payer: Medicare Other

## 2020-03-05 DIAGNOSIS — Z79899 Other long term (current) drug therapy: Secondary | ICD-10-CM | POA: Insufficient documentation

## 2020-03-05 DIAGNOSIS — E119 Type 2 diabetes mellitus without complications: Secondary | ICD-10-CM | POA: Diagnosis not present

## 2020-03-05 DIAGNOSIS — J449 Chronic obstructive pulmonary disease, unspecified: Secondary | ICD-10-CM | POA: Insufficient documentation

## 2020-03-05 DIAGNOSIS — I5022 Chronic systolic (congestive) heart failure: Secondary | ICD-10-CM | POA: Diagnosis not present

## 2020-03-05 DIAGNOSIS — R072 Precordial pain: Secondary | ICD-10-CM | POA: Insufficient documentation

## 2020-03-05 DIAGNOSIS — F17210 Nicotine dependence, cigarettes, uncomplicated: Secondary | ICD-10-CM | POA: Insufficient documentation

## 2020-03-05 DIAGNOSIS — R112 Nausea with vomiting, unspecified: Secondary | ICD-10-CM | POA: Diagnosis not present

## 2020-03-05 DIAGNOSIS — Z794 Long term (current) use of insulin: Secondary | ICD-10-CM | POA: Diagnosis not present

## 2020-03-05 DIAGNOSIS — R0789 Other chest pain: Secondary | ICD-10-CM

## 2020-03-05 DIAGNOSIS — I110 Hypertensive heart disease with heart failure: Secondary | ICD-10-CM | POA: Diagnosis not present

## 2020-03-05 LAB — CBC WITH DIFFERENTIAL/PLATELET
Abs Immature Granulocytes: 0.05 10*3/uL (ref 0.00–0.07)
Basophils Absolute: 0.1 10*3/uL (ref 0.0–0.1)
Basophils Relative: 1 %
Eosinophils Absolute: 0.5 10*3/uL (ref 0.0–0.5)
Eosinophils Relative: 4 %
HCT: 41.7 % (ref 36.0–46.0)
Hemoglobin: 13.2 g/dL (ref 12.0–15.0)
Immature Granulocytes: 0 %
Lymphocytes Relative: 38 %
Lymphs Abs: 5.1 10*3/uL — ABNORMAL HIGH (ref 0.7–4.0)
MCH: 31.9 pg (ref 26.0–34.0)
MCHC: 31.7 g/dL (ref 30.0–36.0)
MCV: 100.7 fL — ABNORMAL HIGH (ref 80.0–100.0)
Monocytes Absolute: 0.8 10*3/uL (ref 0.1–1.0)
Monocytes Relative: 6 %
Neutro Abs: 7 10*3/uL (ref 1.7–7.7)
Neutrophils Relative %: 51 %
Platelets: 349 10*3/uL (ref 150–400)
RBC: 4.14 MIL/uL (ref 3.87–5.11)
RDW: 13.9 % (ref 11.5–15.5)
WBC: 13.5 10*3/uL — ABNORMAL HIGH (ref 4.0–10.5)
nRBC: 0 % (ref 0.0–0.2)

## 2020-03-05 LAB — COMPREHENSIVE METABOLIC PANEL
ALT: 32 U/L (ref 0–44)
AST: 46 U/L — ABNORMAL HIGH (ref 15–41)
Albumin: 3.3 g/dL — ABNORMAL LOW (ref 3.5–5.0)
Alkaline Phosphatase: 58 U/L (ref 38–126)
Anion gap: 10 (ref 5–15)
BUN: 16 mg/dL (ref 6–20)
CO2: 26 mmol/L (ref 22–32)
Calcium: 8.3 mg/dL — ABNORMAL LOW (ref 8.9–10.3)
Chloride: 97 mmol/L — ABNORMAL LOW (ref 98–111)
Creatinine, Ser: 0.48 mg/dL (ref 0.44–1.00)
GFR calc Af Amer: >60 mL/min (ref >60)
GFR calc non Af Amer: >60 mL/min (ref >60)
Glucose, Bld: 152 mg/dL — ABNORMAL HIGH (ref 70–99)
Potassium: 3.4 mmol/L — ABNORMAL LOW (ref 3.5–5.1)
Sodium: 133 mmol/L — ABNORMAL LOW (ref 135–145)
Total Bilirubin: 0.4 mg/dL (ref 0.3–1.2)
Total Protein: 6.7 g/dL (ref 6.5–8.1)

## 2020-03-05 LAB — TROPONIN I (HIGH SENSITIVITY): Troponin I (High Sensitivity): 99 ng/L — ABNORMAL HIGH (ref <18)

## 2020-03-05 NOTE — ED Notes (Signed)
This nurse attempted IV access with no success and unable to draw blood work.

## 2020-03-05 NOTE — ED Provider Notes (Addendum)
[facility] EMERGENCY DEPARTMENT Provider Note   CSN: [id number] Arrival date & time: 03/05/20  2016     History Chief Complaint  Patient presents with  . Chest Pain    [name] is a 54 y.o. female.  Patient is a resident of [place].  Complaint of chest pain on and off that started yesterday.  It kind is upper chest moves across to the left side of the chest.  It only last for 5 minutes or less.  But it occurs frequently throughout the day.  Associated with episodes of vomiting yesterday but none today.  Patient does have a history of GERD.  Patient known to have significant peripheral vascular disease resulting in bilateral above-the-knee amputations.  Patient is on Plavix.  Past medical history significant for coronary artery disease hypertension COPD gastroesophageal reflux disease diabetes without complications.  And apparently had a ischemic right MCA stroke in 2015.  Patient denies any shortness of breath.  Or any abdominal pain.        Past Medical History:  Diagnosis Date  . Anemia   . Anxiety   . Asthma   . Cigarette smoker 02/25/2014  . COPD (chronic obstructive pulmonary disease) ([place])   . Diabetes mellitus without complication ([place])   . GERD (gastroesophageal reflux disease)   . Hx of ischemic right MCA stroke 02/25/2014  . Hypertension   . MI (myocardial infarction) ([place])   . Obesity, unspecified 02/25/2014  . Stroke [facility])     Patient Active Problem List   Diagnosis Date Noted  . History of acute anterior wall MI 10/24/2016  . 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 Chronic systolic congestive heart failure ([place]) 10/24/2016  . Cardiomyopathy, ischemic 10/24/2016  . Sinus tachycardia 10/24/2016  . Sepsis ([place]) 12/20/2015  . UTI (lower urinary tract infection) 12/20/2015  . COPD (chronic obstructive pulmonary disease) ([place]) 12/20/2015  . GERD (gastroesophageal reflux disease) 12/20/2015  . Type 2 diabetes mellitus ([place]) 02/25/2014    . HTN (hypertension) 02/25/2014  . Cigarette smoker 02/25/2014  . Obesity 02/25/2014  . Hx of ischemic right MCA stroke, 1998 02/25/2014  . Cerebral infarction ([place]) 02/20/2014    Past Surgical History:  Procedure Laterality Date  . ABOVE KNEE LEG AMPUTATION Bilateral   . ANGIOPLASTY       OB History   No obstetric history on file.     Family History  Problem Relation Age of Onset  . Vascular Disease Other     Social History   Tobacco Use  . Smoking status: Current Every Day Smoker    Packs/day: 0.50    Types: Cigarettes  . Smokeless tobacco: Never Used  Substance Use Topics  . Alcohol use: No  . Drug use: No    Home Medications Prior to Admission medications   Medication Sig Start Date End Date Taking? Authorizing Provider  ALPRAZolam Duanne Moron) 0.5 MG tablet Take by mouth.    [provider]  ARIPiprazole (ABILIFY) 5 MG tablet Take 5 mg by mouth daily. 12/13/19   [provider]  cefUROXime (CEFTIN) 500 MG tablet Take 1 tablet (500 mg total) by mouth 2 (two) times daily with a meal. Patient not taking: Reported on 12/21/2019 12/23/15   Dustin Flock, MD  clopidogrel (PLAVIX) 75 MG tablet Take 75 mg by mouth daily. 11/26/19   [provider]  digoxin (LANOXIN) 0.125 MG tablet Take 0.125 mg by mouth 3 (three) times a week.     [provider]  divalproex (DEPAKOTE) 125 MG DR tablet  12/19/19   [provider]  docusate sodium (COLACE) 100 MG capsule Take 1 capsule (100 mg total) by mouth daily as needed for mild constipation. 12/23/15   Dustin Flock, MD  DULoxetine (CYMBALTA) 60 MG capsule Take 60 mg by mouth 2 (two) times daily.    [provider]  famotidine (PEPCID) 20 MG tablet Take by mouth.    [provider]  fenofibrate 160 MG tablet Take 160 mg by mouth daily. 11/09/19   [provider]  fentaNYL (DURAGESIC - DOSED MCG/HR) 50 MCG/HR Place 1 patch (50 mcg total) onto the skin every 3  (three) days. Patient not taking: Reported on 12/21/2019 12/23/15   Dustin Flock, MD  gabapentin (NEURONTIN) 300 MG capsule Take 300 mg by mouth 3 (three) times daily.    [provider]  insulin glargine (LANTUS) 100 UNIT/ML injection Inject 30 Units into the skin daily.     [provider]  levothyroxine (SYNTHROID, LEVOTHROID) 25 MCG tablet Take 25 mcg by mouth daily before breakfast.    [provider]  lubiprostone (AMITIZA) 24 MCG capsule Take 24 mcg by mouth 2 (two) times daily with a meal.    [provider]  nitroGLYCERIN (NITROSTAT) 0.4 MG SL tablet Place 0.4 mg under the tongue every 5 (five) minutes as needed for chest pain.    [provider]  rosuvastatin (CRESTOR) 20 MG tablet Take 20 mg by mouth daily.    [provider]  TRULICITY 1.5 CV/8.9FY SOPN  10/29/19   [provider]    Allergies    Ace inhibitors, Flexeril [cyclobenzaprine], Morphine, Paxil [paroxetine hcl], and Silver sulfadiazine  Review of Systems   Review of Systems  Constitutional: Negative for chills and fever.  HENT: Negative for congestion, rhinorrhea and sore throat.   Eyes: Negative for visual disturbance.  Respiratory: Negative for cough and shortness of breath.   Cardiovascular: Positive for chest pain. Negative for leg swelling.  Gastrointestinal: Negative for abdominal pain, diarrhea, nausea and vomiting.  Genitourinary: Negative for dysuria.  Musculoskeletal: Negative for back pain and neck pain.  Skin: Negative for rash.  Neurological: Negative for dizziness, light-headedness and headaches.  Hematological: Does not bruise/bleed easily.  Psychiatric/Behavioral: Negative for confusion.    Physical Exam Updated Vital Signs BP 93/60   Pulse 95   Resp 18   Wt 52.8 kg   SpO2 95%   BMI 35.55 kg/m   Physical Exam Vitals and nursing note reviewed.  Constitutional:      General: She is not in acute distress.    Appearance:  Normal appearance. She is well-developed.  HENT:     Head: Normocephalic and atraumatic.  Eyes:     Extraocular Movements: Extraocular movements intact.     Conjunctiva/sclera: Conjunctivae normal.     Pupils: Pupils are equal, round, and reactive to light.  Cardiovascular:     Rate and Rhythm: Normal rate and regular rhythm.     Heart sounds: No murmur.  Pulmonary:     Effort: Pulmonary effort is normal. No respiratory distress.     Breath sounds: Normal breath sounds.  Chest:     Chest wall: No tenderness.  Abdominal:     Palpations: Abdomen is soft.     Tenderness: There is no abdominal tenderness.  Musculoskeletal:     Cervical back: Normal range of motion and neck supple.     Comments: Bilateral above-the-knee amputations.  Skin:  General: Skin is warm and dry.     Capillary Refill: Capillary refill takes less than 2 seconds.  Neurological:     General: No focal deficit present.     Mental Status: She is alert and oriented to person, place, and time.     ED Results / Procedures / Treatments   Labs (all labs ordered are listed, but only abnormal results are displayed) Labs Reviewed  CBC WITH DIFFERENTIAL/PLATELET - Abnormal; Notable for the following components:      Result Value   WBC 13.5 (*)    MCV 100.7 (*)    Lymphs Abs 5.1 (*)    All other components within normal limits  COMPREHENSIVE METABOLIC PANEL  TROPONIN I (HIGH SENSITIVITY)    EKG EKG Interpretation  Date/Time:  Sunday March 05 2020 20:20:36 EST Ventricular Rate:  94 PR Interval:    QRS Duration: 89 QT Interval:  367 QTC Calculation: 459 R Axis:   88 Text Interpretation: Sinus rhythm Probable anteroseptal infarct, old Confirmed by Fredia Sorrow [phone]) on 03/05/2020 8:56:18 PM   Radiology DG Chest Port 1 View  Result Date: 03/05/2020 CLINICAL DATA:  Chest pain for 2 days, vomiting began yesterday EXAM: PORTABLE CHEST 1 VIEW COMPARISON:  Radiograph 12/11/2027 FINDINGS: Streaky opacities  in the lungs favoring atelectasis. No consolidation, features of edema, pneumothorax, or effusion. The aorta is calcified. The remaining cardiomediastinal contours are unremarkable. Few surgical clips noted in the right lung apex/axilla. No acute osseous or soft tissue abnormality. Degenerative changes are present in the imaged spine and shoulders. IMPRESSION: Streaky opacities in the lungs favoring atelectasis. Aortic Atherosclerosis (ICD10-I70.0). Electronically Signed   By: Lovena Le M.D.   On: 03/05/2020 22:12    Procedures Procedures (including critical care time)  Medications Ordered in ED Medications - No data to display  ED Course  I have reviewed the triage vital signs and the nursing notes.  Pertinent labs & imaging results that were available during my care of the patient were reviewed by me and considered in my medical decision making (see chart for details).    MDM Rules/Calculators/A&P                      No tenderness to palpation to the abdomen or the chest.  Chest x-ray without any significant abnormalities.  EKG without any acute changes but probable old or age undetermined anterior septal infarct.  No evidence of any acute STEMI.  Based on the fact that she has had the pain starting yesterday.  One troponin will be very helpful.  Patient CBC without acute changes.  Rest of labs still pending.    Final Clinical Impression(s) / ED Diagnoses Final diagnoses:  Precordial pain    Rx / DC Orders ED Discharge Orders    None       Fredia Sorrow, MD 03/05/20 2257  Addendum:  Patient's labs without significant abnormalities other than a leukocytosis of 13,000.  And patient's initial troponin was 99.  Patient will require a delta troponin.  As stated her chest discomfort has been intermittent.  I think that if her troponins are steady or go down that she can be discharged home.  Because her chest pain has not lasted more than 5 minutes at a time.      Fredia Sorrow, MD 03/05/20 2322

## 2020-03-05 NOTE — ED Triage Notes (Signed)
Pt arrived to er by ems from [place] with c/o chest pain, pt states that she has been having chest pain for the past two days that has been intermittent. Pain is associated with episode of vomiting yesterday. Pt has hx of gerd and ate two pieces of domino pizza today,

## 2020-03-06 DIAGNOSIS — R072 Precordial pain: Secondary | ICD-10-CM | POA: Diagnosis not present

## 2020-03-06 LAB — TROPONIN I (HIGH SENSITIVITY): Troponin I (High Sensitivity): 85 ng/L — ABNORMAL HIGH (ref <18)

## 2020-03-06 MED ORDER — ONDANSETRON 8 MG PO TBDP
8.0000 mg | ORAL_TABLET | Freq: Once | ORAL | Status: AC
  Administered 2020-03-06: 02:00:00 8 mg via ORAL
  Filled 2020-03-06: qty 1

## 2020-03-06 MED ORDER — ONDANSETRON HCL 4 MG PO TABS: 4.0000 mg | ORAL_TABLET | Freq: Three times a day (TID) | ORAL | 0 refills | Status: AC | PRN

## 2020-03-06 NOTE — Discharge Instructions (Addendum)
Use the Zofran as needed for nausea or vomiting.  Try to drink plenty of fluids so you do not get dehydrated.  Recheck if you get worse again.

## 2020-03-06 NOTE — ED Notes (Signed)
Discharge information discussed on phone with Theressa Stamps, Nurse Noelle Penner. Verbal understanding of discharge information. Pt unable to sign for discharge information. Pt stable and ready for transport back to facility. EMS taking patient's facility paperwork with discharge information.

## 2020-03-06 NOTE — ED Provider Notes (Signed)
Patient left at change of shift to get the results of her delta troponin.  Her first troponin was elevated.  The second 1 is elevated but lower than the first.  When I talked to the patient she states she is no longer having the chest pain.  She states when she did have it it was in the center of her chest and on the right side.  She states yesterday she had vomiting all day long and she has some mild nausea now.  I will discharge her home with some nausea medication.  Results for orders placed or performed during the hospital encounter of 03/05/20  Troponin I (High Sensitivity)  Result Value Ref Range  #1 Troponin I (High Sensitivity) 99 (H) <18 ng/L  Troponin I (High Sensitivity)  Result Value Ref Range  #2 Troponin I (High Sensitivity) 85 (H) <18 ng/L       Rolland Porter, MD 03/06/20 [phone]

## 2020-06-01 ENCOUNTER — Emergency Department (HOSPITAL_COMMUNITY)
Admission: EM | Admit: 2020-06-01 | Discharge: 2020-06-01 | Disposition: A | Discharge status: Home / Self Care | Payer: Medicare (Managed Care) | Attending: Emergency Medicine | Admitting: Emergency Medicine

## 2020-06-01 ENCOUNTER — Encounter (HOSPITAL_COMMUNITY): Payer: Self-pay

## 2020-06-01 ENCOUNTER — Emergency Department (HOSPITAL_COMMUNITY): Payer: Medicare (Managed Care)

## 2020-06-01 DIAGNOSIS — J449 Chronic obstructive pulmonary disease, unspecified: Secondary | ICD-10-CM | POA: Insufficient documentation

## 2020-06-01 DIAGNOSIS — E119 Type 2 diabetes mellitus without complications: Secondary | ICD-10-CM | POA: Diagnosis not present

## 2020-06-01 DIAGNOSIS — F17210 Nicotine dependence, cigarettes, uncomplicated: Secondary | ICD-10-CM | POA: Insufficient documentation

## 2020-06-01 DIAGNOSIS — R079 Chest pain, unspecified: Secondary | ICD-10-CM | POA: Insufficient documentation

## 2020-06-01 DIAGNOSIS — I252 Old myocardial infarction: Secondary | ICD-10-CM | POA: Diagnosis not present

## 2020-06-01 DIAGNOSIS — I10 Essential (primary) hypertension: Secondary | ICD-10-CM | POA: Diagnosis not present

## 2020-06-01 DIAGNOSIS — Z8673 Personal history of transient ischemic attack (TIA), and cerebral infarction without residual deficits: Secondary | ICD-10-CM | POA: Diagnosis not present

## 2020-06-01 DIAGNOSIS — I2510 Atherosclerotic heart disease of native coronary artery without angina pectoris: Secondary | ICD-10-CM | POA: Diagnosis not present

## 2020-06-01 DIAGNOSIS — J45909 Unspecified asthma, uncomplicated: Secondary | ICD-10-CM | POA: Diagnosis not present

## 2020-06-01 DIAGNOSIS — F419 Anxiety disorder, unspecified: Secondary | ICD-10-CM | POA: Diagnosis present

## 2020-06-01 LAB — CBC
HCT: 36.5 % (ref 36.0–46.0)
Hemoglobin: 11.7 g/dL — ABNORMAL LOW (ref 12.0–15.0)
MCH: 31.9 pg (ref 26.0–34.0)
MCHC: 32.1 g/dL (ref 30.0–36.0)
MCV: 99.5 fL (ref 80.0–100.0)
Platelets: 274 10*3/uL (ref 150–400)
RBC: 3.67 MIL/uL — ABNORMAL LOW (ref 3.87–5.11)
RDW: 14.1 % (ref 11.5–15.5)
WBC: 7.3 10*3/uL (ref 4.0–10.5)
nRBC: 0 % (ref 0.0–0.2)

## 2020-06-01 LAB — COMPREHENSIVE METABOLIC PANEL
ALT: 27 U/L (ref 0–44)
AST: 25 U/L (ref 15–41)
Albumin: 3.4 g/dL — ABNORMAL LOW (ref 3.5–5.0)
Alkaline Phosphatase: 68 U/L (ref 38–126)
Anion gap: 11 (ref 5–15)
BUN: 20 mg/dL (ref 6–20)
CO2: 25 mmol/L (ref 22–32)
Calcium: 9.2 mg/dL (ref 8.9–10.3)
Chloride: 97 mmol/L — ABNORMAL LOW (ref 98–111)
Creatinine, Ser: 0.45 mg/dL (ref 0.44–1.00)
GFR calc Af Amer: >60 mL/min (ref >60)
GFR calc non Af Amer: >60 mL/min (ref >60)
Glucose, Bld: 262 mg/dL — ABNORMAL HIGH (ref 70–99)
Potassium: 4 mmol/L (ref 3.5–5.1)
Sodium: 133 mmol/L — ABNORMAL LOW (ref 135–145)
Total Bilirubin: 0.3 mg/dL (ref 0.3–1.2)
Total Protein: 6.9 g/dL (ref 6.5–8.1)

## 2020-06-01 LAB — TROPONIN I (HIGH SENSITIVITY)
Troponin I (High Sensitivity): 8 ng/L (ref <18)
Troponin I (High Sensitivity): 8 ng/L (ref <18)

## 2020-06-01 MED ORDER — ALPRAZOLAM 0.5 MG PO TABS
0.5000 mg | ORAL_TABLET | Freq: Once | ORAL | Status: AC
  Administered 2020-06-01: 0.5 mg via ORAL
  Filled 2020-06-01: qty 1

## 2020-06-01 NOTE — Discharge Instructions (Addendum)
You were evaluated in the Emergency Department and after careful evaluation, we did not find any emergent condition requiring admission or further testing in the hospital.  Your exam/testing today is overall reassuring.  No signs of heart damage today.  Please return to the Emergency Department if you experience any worsening of your condition.  We encourage you to follow up with a primary care provider.  Thank you for allowing [place] to be a part of your care.
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
## 2020-06-01 NOTE — ED Provider Notes (Signed)
[facility] Emergency Department Provider Note MRN:  [id number]  Arrival date & time: 06/01/20     Chief Complaint   Anxiety   History of Present Illness   [name] is a 54 y.o. year-old female with a history of COPD, CAD, stroke, PAD presenting to the ED with chief complaint of anxiety.  Patient explains that her care facility was late to get her out of bed.  She was late getting her Xanax.  She began feeling very anxious, with chest pain and shortness of breath, she called EMS to bring her to the emergency department.  Pain is described as a pressure, does not feel like her prior heart attacks.  Still feels anxious, wishes to live somewhere else.  Denies diaphoresis, no nausea or vomiting, no shortness of breath.  Symptoms are mild to moderate, constant.  Review of Systems  A complete 10 system review of systems was obtained and all systems are negative except as noted in the HPI and PMH.   Patient's Health History    Past Medical History:  Diagnosis Date  . Anemia   . Anxiety   . Asthma   . Cigarette smoker 02/25/2014  . COPD (chronic obstructive pulmonary disease) ([place])   . Diabetes mellitus without complication ([place])   . GERD (gastroesophageal reflux disease)   . Hx of ischemic right MCA stroke 02/25/2014  . Hypertension   . MI (myocardial infarction) ([place])   . Obesity, unspecified 02/25/2014  . Stroke [facility])     Past Surgical History:  Procedure Laterality Date  . ABOVE KNEE LEG AMPUTATION Bilateral   . ANGIOPLASTY      Family History  Problem Relation Age of Onset  . Vascular Disease Other     Social History   Socioeconomic History  . Marital status: Single    Spouse name: Not on file  . Number of children: Not on file  . Years of education: Not on file  . Highest education level: Not on file  Occupational History  . Not on file  Tobacco Use  . Smoking status: Current Every Day Smoker    Packs/day: 0.50    Types: Cigarettes  .  Smokeless tobacco: Never Used  Substance and Sexual Activity  . Alcohol use: No  . Drug use: No  . Sexual activity: Not on file  Other Topics Concern  . Not on file  Social History Narrative  . Not on file   Social Determinants of Health   Financial Resource Strain:   . Difficulty of Paying Living Expenses:   Food Insecurity:   . Worried About Charity fundraiser in the Last Year:   . Arboriculturist in the Last Year:   Transportation Needs:   . Film/video editor (Medical):   Marland Kitchen Lack of Transportation (Non-Medical):   Physical Activity:   . Days of Exercise per Week:   . Minutes of Exercise per Session:   Stress:   . Feeling of Stress :   Social Connections:   . Frequency of Communication with Friends and Family:   . Frequency of Social Gatherings with Friends and Family:   . Attends Religious Services:   . Active Member of Clubs or Organizations:   . Attends Archivist Meetings:   Marland Kitchen Marital Status:   Intimate Partner Violence:   . Fear of Current or Ex-Partner:   . Emotionally Abused:   Marland Kitchen Physically Abused:   . Sexually Abused:  Physical Exam   Vitals:   06/01/20 1030 06/01/20 1100  BP:  103/60  Pulse: (!) 107 96  Resp: 12   Temp:    SpO2: 97% 94%    CONSTITUTIONAL: Chronically ill-appearing, NAD NEURO:  Alert and oriented x 3, no focal deficits EYES:  eyes equal and reactive ENT/NECK:  no LAD, no JVD CARDIO: Regular rate, well-perfused, normal S1 and S2 PULM:  CTAB no wheezing or rhonchi GI/GU:  normal bowel sounds, non-distended, non-tender MSK/SPINE:  No gross deformities, bilateral leg amputee SKIN:  no rash, atraumatic PSYCH:  Appropriate speech and behavior  *Additional and/or pertinent findings included in MDM below  Diagnostic and Interventional Summary    EKG Interpretation  Date/Time:  Thursday June 01 2020 09:43:31 EDT Ventricular Rate:  98 PR Interval:    QRS Duration: 79 QT Interval:  334 QTC Calculation: 427 R  Axis:   81 Text Interpretation: Sinus rhythm Probable anteroseptal infarct, old Confirmed by Gerlene Fee [phone]) on 06/01/2020 12:20:27 PM      Labs Reviewed  CBC - Abnormal; Notable for the following components:      Result Value   RBC 3.67 (*)    Hemoglobin 11.7 (*)    All other components within normal limits  COMPREHENSIVE METABOLIC PANEL - Abnormal; Notable for the following components:   Sodium 133 (*)    Chloride 97 (*)    Glucose, Bld 262 (*)    Albumin 3.4 (*)    All other components within normal limits  TROPONIN I (HIGH SENSITIVITY)  TROPONIN I (HIGH SENSITIVITY)    DG Chest Port 1 View  Final Result      Medications  ALPRAZolam Duanne Moron) tablet 0.5 mg (0.5 mg Oral Given 06/01/20 1019)     Procedures  /  Critical Care Procedures  ED Course and Medical Decision Making  I have reviewed the triage vital signs, the nursing notes, and pertinent available records from the EMR.  Listed above are laboratory and imaging tests that I personally ordered, reviewed, and interpreted and then considered in my medical decision making (see below for details).      Suspect anxiety is the driving cause of patient's symptoms, however given patient's significant past medical history, screening with EKG, troponin, chest x-ray.  No evidence of DVT on exam, doubt PE, doubt dissection.  Work-up is reassuring, troponin negative x2, appropriate for discharge.  Barth Kirks. Sedonia Small, MD [place] [email or web address]  Final Clinical Impressions(s) / ED Diagnoses     ICD-10-CM   1. Chest pain, unspecified type  R07.9     ED Discharge Orders    None       Discharge Instructions Discussed with and Provided to Patient:     Discharge Instructions     You were evaluated in the Emergency Department and after careful evaluation, we did not find any emergent condition requiring admission or further testing in the hospital.  Your  exam/testing today is overall reassuring.  No signs of heart damage today.  Please return to the Emergency Department if you experience any worsening of your condition.  We encourage you to follow up with a primary care provider.  Thank you for allowing [place] to be a part of your care.       Maudie Flakes, MD 06/01/20 [phone]

## 2020-06-01 NOTE — ED Notes (Signed)
Pt resting, eyes closed

## 2020-06-12 ENCOUNTER — Other Ambulatory Visit: Payer: Self-pay

## 2020-06-12 ENCOUNTER — Encounter (HOSPITAL_COMMUNITY): Payer: Self-pay

## 2020-06-12 ENCOUNTER — Emergency Department (HOSPITAL_COMMUNITY)
Admission: EM | Admit: 2020-06-12 | Discharge: 2020-06-12 | Disposition: A | Discharge status: Skilled Nursing Facility | Payer: Medicare (Managed Care) | Attending: Emergency Medicine | Admitting: Emergency Medicine

## 2020-06-12 DIAGNOSIS — R451 Restlessness and agitation: Secondary | ICD-10-CM | POA: Insufficient documentation

## 2020-06-12 DIAGNOSIS — I2510 Atherosclerotic heart disease of native coronary artery without angina pectoris: Secondary | ICD-10-CM | POA: Diagnosis not present

## 2020-06-12 DIAGNOSIS — Z79891 Long term (current) use of opiate analgesic: Secondary | ICD-10-CM | POA: Diagnosis not present

## 2020-06-12 DIAGNOSIS — Z7901 Long term (current) use of anticoagulants: Secondary | ICD-10-CM | POA: Insufficient documentation

## 2020-06-12 DIAGNOSIS — Z89612 Acquired absence of left leg above knee: Secondary | ICD-10-CM | POA: Diagnosis not present

## 2020-06-12 DIAGNOSIS — R45851 Suicidal ideations: Secondary | ICD-10-CM | POA: Insufficient documentation

## 2020-06-12 DIAGNOSIS — E119 Type 2 diabetes mellitus without complications: Secondary | ICD-10-CM | POA: Diagnosis not present

## 2020-06-12 DIAGNOSIS — I110 Hypertensive heart disease with heart failure: Secondary | ICD-10-CM | POA: Diagnosis not present

## 2020-06-12 DIAGNOSIS — J449 Chronic obstructive pulmonary disease, unspecified: Secondary | ICD-10-CM | POA: Insufficient documentation

## 2020-06-12 DIAGNOSIS — F17210 Nicotine dependence, cigarettes, uncomplicated: Secondary | ICD-10-CM | POA: Insufficient documentation

## 2020-06-12 DIAGNOSIS — J45909 Unspecified asthma, uncomplicated: Secondary | ICD-10-CM | POA: Insufficient documentation

## 2020-06-12 DIAGNOSIS — Z7982 Long term (current) use of aspirin: Secondary | ICD-10-CM | POA: Diagnosis not present

## 2020-06-12 DIAGNOSIS — I5022 Chronic systolic (congestive) heart failure: Secondary | ICD-10-CM | POA: Diagnosis not present

## 2020-06-12 DIAGNOSIS — Z89611 Acquired absence of right leg above knee: Secondary | ICD-10-CM | POA: Insufficient documentation

## 2020-06-12 DIAGNOSIS — I255 Ischemic cardiomyopathy: Secondary | ICD-10-CM | POA: Diagnosis not present

## 2020-06-12 DIAGNOSIS — Z794 Long term (current) use of insulin: Secondary | ICD-10-CM | POA: Insufficient documentation

## 2020-06-12 MED ORDER — INSULIN GLARGINE 100 UNIT/ML ~~LOC~~ SOLN
40.0000 [IU] | Freq: Every day | SUBCUTANEOUS | Status: DC
  Filled 2020-06-12: qty 0.4

## 2020-06-12 MED ORDER — GABAPENTIN 400 MG PO CAPS
400.0000 mg | ORAL_CAPSULE | Freq: Three times a day (TID) | ORAL | Status: DC
  Administered 2020-06-12: 400 mg via ORAL
  Filled 2020-06-12: qty 1

## 2020-06-12 MED ORDER — ARIPIPRAZOLE 5 MG PO TABS
5.0000 mg | ORAL_TABLET | Freq: Every day | ORAL | Status: DC
  Filled 2020-06-12: qty 1

## 2020-06-12 MED ORDER — QUETIAPINE FUMARATE 25 MG PO TABS
25.0000 mg | ORAL_TABLET | Freq: Two times a day (BID) | ORAL | Status: DC
  Filled 2020-06-12: qty 1

## 2020-06-12 MED ORDER — METFORMIN HCL 500 MG PO TABS
500.0000 mg | ORAL_TABLET | Freq: Two times a day (BID) | ORAL | Status: DC
  Administered 2020-06-12: 500 mg via ORAL
  Filled 2020-06-12: qty 1

## 2020-06-12 MED ORDER — DIGOXIN 250 MCG PO TABS
250.0000 ug | ORAL_TABLET | Freq: Every day | ORAL | Status: DC
  Filled 2020-06-12 (×2): qty 1

## 2020-06-12 MED ORDER — TRAMADOL HCL 50 MG PO TABS
100.0000 mg | ORAL_TABLET | Freq: Three times a day (TID) | ORAL | Status: DC
  Administered 2020-06-12: 100 mg via ORAL
  Filled 2020-06-12: qty 2

## 2020-06-12 MED ORDER — BACLOFEN 10 MG PO TABS
5.0000 mg | ORAL_TABLET | Freq: Once | ORAL | Status: AC
  Administered 2020-06-12: 5 mg via ORAL
  Filled 2020-06-12: qty 1

## 2020-06-12 MED ORDER — FLUOXETINE HCL 20 MG PO CAPS: 20.0000 mg | ORAL_CAPSULE | Freq: Every day | ORAL | Status: DC

## 2020-06-12 MED ORDER — CLOPIDOGREL BISULFATE 75 MG PO TABS
75.0000 mg | ORAL_TABLET | Freq: Every day | ORAL | Status: DC
  Filled 2020-06-12: qty 1

## 2020-06-12 MED ORDER — POTASSIUM CHLORIDE CRYS ER 20 MEQ PO TBCR: 40.0000 meq | EXTENDED_RELEASE_TABLET | Freq: Every day | ORAL | Status: DC

## 2020-06-12 MED ORDER — PANTOPRAZOLE SODIUM 40 MG PO TBEC: 40.0000 mg | DELAYED_RELEASE_TABLET | Freq: Every day | ORAL | Status: DC

## 2020-06-12 MED ORDER — CALCIUM CARBONATE ANTACID 500 MG PO CHEW: 2.0000 | CHEWABLE_TABLET | Freq: Every day | ORAL | Status: DC

## 2020-06-12 MED ORDER — ATORVASTATIN CALCIUM 40 MG PO TABS
80.0000 mg | ORAL_TABLET | Freq: Every day | ORAL | Status: DC
  Administered 2020-06-12: 80 mg via ORAL
  Filled 2020-06-12: qty 2

## 2020-06-12 MED ORDER — ASPIRIN EC 81 MG PO TBEC
81.0000 mg | DELAYED_RELEASE_TABLET | Freq: Every day | ORAL | Status: DC
  Filled 2020-06-12: qty 1

## 2020-06-12 MED ORDER — DULOXETINE HCL 30 MG PO CPEP
60.0000 mg | ORAL_CAPSULE | Freq: Two times a day (BID) | ORAL | Status: DC
  Filled 2020-06-12: qty 2

## 2020-06-12 MED ORDER — DIVALPROEX SODIUM 250 MG PO DR TAB
250.0000 mg | DELAYED_RELEASE_TABLET | Freq: Two times a day (BID) | ORAL | Status: DC
  Filled 2020-06-12: qty 1

## 2020-06-12 MED ORDER — ALPRAZOLAM 0.5 MG PO TABS
0.5000 mg | ORAL_TABLET | Freq: Two times a day (BID) | ORAL | Status: DC
  Filled 2020-06-12: qty 1

## 2020-06-12 NOTE — Discharge Instructions (Signed)
Follow up with your family md for a pain md referral
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
## 2020-06-12 NOTE — ED Provider Notes (Signed)
[facility] EMERGENCY DEPARTMENT Provider Note   CSN: [id number] Arrival date & time: 06/12/20  0946     History Chief Complaint  Patient presents with  . Suicidal    [name] is a 54 y.o. female.  HPI    54 year old female comes in a chief complaint of suicidal ideation.  Patient has history of COPD, CAD, chronic pain.  She reports that she was sent here by the [facility] facility for reasons unknown to her.  According to the EMS report, patient had made some suicidal ideations at the nursing facility, prompting them to send her to the ER.  Patient reports that she has been residing at the facility for 2 years and that they have not taken good care of her pain.  I called [facility], and I was informed that patient typically has voiced her frustration with pain control and inability to smoke cigarettes whenever she wants, but she has never made suicidal ideations today.  Today she commented that she "rather be dead than alive in pain" and she also mentioned that she would rather "take a gun and shoot the police and get shot herself."  Patient has no complaints from her side.  Past Medical History:  Diagnosis Date  . Anemia   . Anxiety   . Asthma   . Cigarette smoker 02/25/2014  . COPD (chronic obstructive pulmonary disease) ([place])   . Diabetes mellitus without complication ([place])   . GERD (gastroesophageal reflux disease)   . Hx of ischemic right MCA stroke 02/25/2014  . Hypertension   . MI (myocardial infarction) ([place])   . Obesity, unspecified 02/25/2014  . Stroke [facility])     Patient Active Problem List   Diagnosis Date Noted  . History of acute anterior wall MI 10/24/2016  . 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 Chronic systolic congestive heart failure ([employer]) 10/24/2016  . Cardiomyopathy, ischemic 10/24/2016  . Sinus tachycardia 10/24/2016  . Sepsis ([place]) 12/20/2015  . UTI (lower urinary tract infection) 12/20/2015  . COPD (chronic  obstructive pulmonary disease) ([place]) 12/20/2015  . GERD (gastroesophageal reflux disease) 12/20/2015  . Type 2 diabetes mellitus ([place]) 02/25/2014  . HTN (hypertension) 02/25/2014  . Cigarette smoker 02/25/2014  . Obesity 02/25/2014  . Hx of ischemic right MCA stroke, 1998 02/25/2014  . Cerebral infarction ([place]) 02/20/2014    Past Surgical History:  Procedure Laterality Date  . ABOVE KNEE LEG AMPUTATION Bilateral   . ANGIOPLASTY       OB History   No obstetric history on file.     Family History  Problem Relation Age of Onset  . Vascular Disease Other     Social History   Tobacco Use  . Smoking status: Current Every Day Smoker    Packs/day: 0.50    Types: Cigarettes  . Smokeless tobacco: Never Used  Vaping Use  . Vaping Use: Never used  Substance Use Topics  . Alcohol use: No  . Drug use: No    Home Medications Prior to Admission medications   Medication Sig Start Date End Date Taking? Authorizing Provider  acetaminophen (TYLENOL) 650 MG CR tablet Take 1,300 mg by mouth 2 (two) times daily.   Yes [provider]  ALPRAZolam Duanne Moron) 0.5 MG tablet Take 0.5 mg by mouth 2 (two) times daily.    Yes [provider]  ARIPiprazole (ABILIFY) 5 MG tablet Take 5 mg by mouth daily. 12/13/19  Yes [provider]  aspirin EC  81 MG tablet Take 81 mg by mouth daily. Swallow whole.   Yes [provider]  atorvastatin (LIPITOR) 80 MG tablet Take 80 mg by mouth daily.   Yes [provider]  Baclofen 5 MG TABS Take 1 tablet by mouth 2 (two) times daily.   Yes [provider]  calcium carbonate (TUMS - DOSED IN MG ELEMENTAL CALCIUM) 500 MG chewable tablet Chew 2 tablets by mouth at bedtime.   Yes [provider]  clopidogrel (PLAVIX) 75 MG tablet Take 75 mg by mouth daily. 11/26/19  Yes [provider]  Cranberry 450 MG CAPS Take 2 tablets by mouth 2 (two) times daily.   Yes [provider]  digoxin  (LANOXIN) 0.25 MG tablet Take 250 mcg by mouth daily.    Yes [provider]  divalproex (DEPAKOTE) 125 MG DR tablet Take 250 mg by mouth 2 (two) times daily.  12/19/19  Yes [provider]  DULoxetine (CYMBALTA) 60 MG capsule Take 60 mg by mouth 2 (two) times daily.   Yes [provider]  Ensure (ENSURE) Take 237 mLs by mouth 2 (two) times daily between meals.   Yes [provider]  fenofibrate 160 MG tablet Take 160 mg by mouth daily. 11/09/19  Yes [provider]  gabapentin (NEURONTIN) 800 MG tablet Take 300 mg by mouth 3 (three) times daily.    Yes [provider]  insulin glargine (LANTUS) 100 UNIT/ML injection Inject 40 Units into the skin daily.    Yes [provider]  insulin lispro (HUMALOG) 100 UNIT/ML injection Inject 2-15 Units into the skin in the morning, at noon, in the evening, and at bedtime. Before meals and at bedtime. Per sliding scale   Yes [provider]  lidocaine (LIDODERM) 5 % Place 3 patches onto the skin daily. Remove & Discard patch within 12 hours to lower back and each knee stump. (1 patch for each body part)   Yes [provider]  melatonin 3 MG TABS tablet Take 3 mg by mouth at bedtime.   Yes [provider]  metFORMIN (GLUCOPHAGE) 500 MG tablet Take 500 mg by mouth 2 (two) times daily with a meal.   Yes [provider]  methenamine (HIPREX) 1 g tablet Take 1 g by mouth daily.   Yes [provider]  Metoprolol-Hydrochlorothiazide 25-12.5 MG TB24 Take 1 tablet by mouth 2 (two) times daily.   Yes [provider]  Multiple Vitamin (MULTIVITAMIN) tablet Take 1 tablet by mouth daily.   Yes [provider]  nitroGLYCERIN (NITROSTAT) 0.4 MG SL tablet Place 0.4 mg under the tongue every 5 (five) minutes as needed for chest pain.   Yes [provider]  ondansetron (ZOFRAN) 4 MG tablet Take 1 tablet (4 mg total) by mouth every 8 (eight) hours as  needed for nausea or vomiting. 03/06/20  Yes Rolland Porter, MD  pantoprazole (PROTONIX) 20 MG tablet Take 20 mg by mouth daily.   Yes [provider]  polyethylene glycol (MIRALAX / GLYCOLAX) 17 g packet Take 17 g by mouth daily as needed.   Yes [provider]  Potassium Chloride ER 20 MEQ TBCR Take 2 tablets by mouth daily.   Yes [provider]  QUEtiapine (SEROQUEL) 25 MG tablet Take 25 mg by mouth 2 (two) times daily.   Yes [provider]  Semaglutide, 1 MG/DOSE, (OZEMPIC, 1 MG/DOSE,) 2 MG/1.5ML SOPN Inject 1 mg into the skin once a week. On fridays 12/10/19  Yes [provider]  traMADol (ULTRAM) 50 MG tablet Take 100 mg by mouth every 8 (eight) hours.   Yes [provider]  vitamin B-12 (CYANOCOBALAMIN) 500 MCG tablet Take 1,000 mcg by mouth daily.   Yes [provider]  cefUROXime (CEFTIN) 500 MG tablet Take 1 tablet (500 mg total) by mouth 2 (two) times daily with a meal. Patient not taking: Reported on 12/21/2019 12/23/15   Dustin Flock, MD  fentaNYL (DURAGESIC - DOSED MCG/HR) 50 MCG/HR Place 1 patch (50 mcg total) onto the skin every 3 (three) days. Patient not taking: Reported on 12/21/2019 12/23/15   Dustin Flock, MD  FLUoxetine (PROZAC) 20 MG tablet Take 20 mg by mouth daily.    [provider]    Allergies    Ace inhibitors, Flexeril [cyclobenzaprine], Morphine, Paxil [paroxetine hcl], and Silver sulfadiazine  Review of Systems   Review of Systems  Constitutional: Positive for activity change.  Respiratory: Negative for shortness of breath.   Cardiovascular: Negative for chest pain.  Gastrointestinal: Negative for nausea and vomiting.  Psychiatric/Behavioral: Positive for agitation.  All other systems reviewed and are negative.   Physical Exam Updated Vital Signs BP (!) 97/53 (BP Location: Right Arm)   Pulse 90   Temp 97.9 F (36.6 C) (Oral)   Resp 16   Ht 3' (0.914 m)   Wt 52.6 kg   SpO2 94%    BMI 62.93 kg/m   Physical Exam Vitals and nursing note reviewed.  Constitutional:      Appearance: She is well-developed.  HENT:     Head: Normocephalic and atraumatic.  Cardiovascular:     Rate and Rhythm: Normal rate.  Pulmonary:     Effort: Pulmonary effort is normal.  Abdominal:     General: Bowel sounds are normal.  Musculoskeletal:     Cervical back: Normal range of motion and neck supple.  Skin:    General: Skin is warm and dry.  Neurological:     Mental Status: She is alert and oriented to person, place, and time.  Psychiatric:     Comments: Agitated     ED Results / Procedures / Treatments   Labs (all labs ordered are listed, but only abnormal results are displayed) Labs Reviewed  SARS CORONAVIRUS 2 BY RT PCR (HOSPITAL ORDER, [place] [facility])  POC URINE PREG, ED    EKG None  Radiology No results found.  Procedures Procedures (including critical care time)  Medications Ordered in ED Medications  ALPRAZolam (XANAX) tablet 0.5 mg (has no administration in time range)  ARIPiprazole (ABILIFY) tablet 5 mg (has no administration in time range)  aspirin EC tablet 81 mg (has no administration in time range)  atorvastatin (LIPITOR) tablet 80 mg (has no administration in time range)  calcium carbonate (TUMS - dosed in mg elemental calcium) chewable tablet 400 mg of elemental calcium (has no administration in time range)  clopidogrel (PLAVIX) tablet 75 mg (has no administration in time range)  digoxin (LANOXIN) tablet 250 mcg (has no administration in time range)  divalproex (DEPAKOTE) DR tablet 250 mg (has no administration in time range)  DULoxetine (CYMBALTA) DR capsule 60 mg (has no administration in time range)  FLUoxetine (PROZAC) tablet 20 mg (has no administration in time range)  gabapentin (NEURONTIN) tablet 400 mg (has no administration in time range)  insulin glargine (LANTUS) injection 40 Units (has no administration in time  range)  metFORMIN (GLUCOPHAGE) tablet 500 mg (has no administration in time  range)  pantoprazole (PROTONIX) EC tablet 20 mg (has no administration in time range)  QUEtiapine (SEROQUEL) tablet 25 mg (has no administration in time range)  Potassium Chloride ER TBCR 2 tablet (has no administration in time range)  traMADol (ULTRAM) tablet 100 mg (has no administration in time range)    ED Course  I have reviewed the triage vital signs and the nursing notes.  Pertinent labs & imaging results that were available during my care of the patient were reviewed by me and considered in my medical decision making (see chart for details).    MDM Rules/Calculators/A&P                          54 year old with multiple medical comorbidities and complex pain syndrome comes in a chief complaint of suicidal ideation.  Patient has no medical complaints and has been cleared.  She has no complaints from at all from her side.  She reports that the nursing home sent her to the ER for further evaluation.  Upon calling the nursing home it appears that patient had made some verbal threats of wanting to hurt herself.  She had labs drawn a week ago which were all unremarkable.  I do not think she needs further medical lab work-up.  Psych has been consulted, patient is medically cleared.  Final Clinical Impression(s) / ED Diagnoses Final diagnoses:  Suicidal ideation    Rx / DC Orders ED Discharge Orders    None       Varney Biles, MD 06/12/20 1408

## 2020-06-12 NOTE — [facility] Counselor (Addendum)
@  1353 TTS contacted [facility] ED to initiate tele-psych @1416  attempting to connect again @1422 - TTS still unable to connect. Will go to next assessment and see this patient when equipment is working.

## 2020-06-12 NOTE — ED Notes (Signed)
Pt turned to left side and set up to eat lunch tray. Incontinence pad checked and was dry.

## 2020-06-12 NOTE — ED Triage Notes (Signed)
Pt brought in via [facility] from [facility]. Staff reports she has been making suicidal threats, however, pt denies SI & HI. Pt states [facility] will not manage her pain and she wants to go to a different facility.

## 2020-07-05 ENCOUNTER — Encounter (HOSPITAL_COMMUNITY): Payer: Self-pay | Admitting: Emergency Medicine

## 2020-07-05 ENCOUNTER — Emergency Department (HOSPITAL_COMMUNITY): Payer: Medicare (Managed Care)

## 2020-07-05 ENCOUNTER — Other Ambulatory Visit: Payer: Self-pay

## 2020-07-05 ENCOUNTER — Observation Stay (HOSPITAL_COMMUNITY)
Admission: EM | Admit: 2020-07-05 | Discharge: 2020-07-06 | Disposition: A | Discharge status: Nursing Home (Includes ICF) | Payer: Medicare (Managed Care) | Attending: Internal Medicine | Admitting: Internal Medicine

## 2020-07-05 DIAGNOSIS — Z885 Allergy status to narcotic agent status: Secondary | ICD-10-CM | POA: Insufficient documentation

## 2020-07-05 DIAGNOSIS — Z794 Long term (current) use of insulin: Secondary | ICD-10-CM | POA: Diagnosis not present

## 2020-07-05 DIAGNOSIS — G9340 Encephalopathy, unspecified: Secondary | ICD-10-CM | POA: Diagnosis not present

## 2020-07-05 DIAGNOSIS — Z89512 Acquired absence of left leg below knee: Secondary | ICD-10-CM | POA: Insufficient documentation

## 2020-07-05 DIAGNOSIS — I110 Hypertensive heart disease with heart failure: Secondary | ICD-10-CM | POA: Diagnosis not present

## 2020-07-05 DIAGNOSIS — I252 Old myocardial infarction: Secondary | ICD-10-CM | POA: Diagnosis not present

## 2020-07-05 DIAGNOSIS — I255 Ischemic cardiomyopathy: Secondary | ICD-10-CM | POA: Diagnosis present

## 2020-07-05 DIAGNOSIS — F17210 Nicotine dependence, cigarettes, uncomplicated: Secondary | ICD-10-CM | POA: Diagnosis not present

## 2020-07-05 DIAGNOSIS — I5022 Chronic systolic (congestive) heart failure: Secondary | ICD-10-CM | POA: Diagnosis not present

## 2020-07-05 DIAGNOSIS — Z89511 Acquired absence of right leg below knee: Secondary | ICD-10-CM | POA: Insufficient documentation

## 2020-07-05 DIAGNOSIS — F419 Anxiety disorder, unspecified: Secondary | ICD-10-CM | POA: Insufficient documentation

## 2020-07-05 DIAGNOSIS — E119 Type 2 diabetes mellitus without complications: Secondary | ICD-10-CM | POA: Diagnosis not present

## 2020-07-05 DIAGNOSIS — K219 Gastro-esophageal reflux disease without esophagitis: Secondary | ICD-10-CM | POA: Diagnosis not present

## 2020-07-05 DIAGNOSIS — Z20822 Contact with and (suspected) exposure to covid-19: Secondary | ICD-10-CM | POA: Insufficient documentation

## 2020-07-05 DIAGNOSIS — R531 Weakness: Secondary | ICD-10-CM | POA: Diagnosis present

## 2020-07-05 DIAGNOSIS — Z7982 Long term (current) use of aspirin: Secondary | ICD-10-CM | POA: Diagnosis not present

## 2020-07-05 DIAGNOSIS — I700 Atherosclerosis of aorta: Secondary | ICD-10-CM | POA: Insufficient documentation

## 2020-07-05 DIAGNOSIS — Z8673 Personal history of transient ischemic attack (TIA), and cerebral infarction without residual deficits: Secondary | ICD-10-CM

## 2020-07-05 DIAGNOSIS — Z79899 Other long term (current) drug therapy: Secondary | ICD-10-CM | POA: Diagnosis not present

## 2020-07-05 DIAGNOSIS — E1151 Type 2 diabetes mellitus with diabetic peripheral angiopathy without gangrene: Secondary | ICD-10-CM | POA: Insufficient documentation

## 2020-07-05 DIAGNOSIS — E039 Hypothyroidism, unspecified: Secondary | ICD-10-CM | POA: Diagnosis not present

## 2020-07-05 DIAGNOSIS — F329 Major depressive disorder, single episode, unspecified: Secondary | ICD-10-CM | POA: Insufficient documentation

## 2020-07-05 DIAGNOSIS — E1169 Type 2 diabetes mellitus with other specified complication: Secondary | ICD-10-CM

## 2020-07-05 DIAGNOSIS — I10 Essential (primary) hypertension: Secondary | ICD-10-CM | POA: Diagnosis not present

## 2020-07-05 DIAGNOSIS — J449 Chronic obstructive pulmonary disease, unspecified: Secondary | ICD-10-CM | POA: Diagnosis present

## 2020-07-05 DIAGNOSIS — D72829 Elevated white blood cell count, unspecified: Principal | ICD-10-CM | POA: Insufficient documentation

## 2020-07-05 DIAGNOSIS — L8990 Pressure ulcer of unspecified site, unspecified stage: Secondary | ICD-10-CM | POA: Insufficient documentation

## 2020-07-05 DIAGNOSIS — Z7902 Long term (current) use of antithrombotics/antiplatelets: Secondary | ICD-10-CM | POA: Insufficient documentation

## 2020-07-05 LAB — COMPREHENSIVE METABOLIC PANEL
ALT: 22 U/L (ref 0–44)
AST: 35 U/L (ref 15–41)
Albumin: 2.4 g/dL — ABNORMAL LOW (ref 3.5–5.0)
Alkaline Phosphatase: 101 U/L (ref 38–126)
Anion gap: 12 (ref 5–15)
BUN: 11 mg/dL (ref 6–20)
CO2: 26 mmol/L (ref 22–32)
Calcium: 8.9 mg/dL (ref 8.9–10.3)
Chloride: 96 mmol/L — ABNORMAL LOW (ref 98–111)
Creatinine, Ser: 0.48 mg/dL (ref 0.44–1.00)
GFR calc Af Amer: >60 mL/min (ref >60)
GFR calc non Af Amer: >60 mL/min (ref >60)
Glucose, Bld: 129 mg/dL — ABNORMAL HIGH (ref 70–99)
Potassium: 4 mmol/L (ref 3.5–5.1)
Sodium: 134 mmol/L — ABNORMAL LOW (ref 135–145)
Total Bilirubin: 0.6 mg/dL (ref 0.3–1.2)
Total Protein: 6.9 g/dL (ref 6.5–8.1)

## 2020-07-05 LAB — CBC WITH DIFFERENTIAL/PLATELET
Abs Immature Granulocytes: 0.23 10*3/uL — ABNORMAL HIGH (ref 0.00–0.07)
Basophils Absolute: 0.1 10*3/uL (ref 0.0–0.1)
Basophils Relative: 0 %
Eosinophils Absolute: 0.1 10*3/uL (ref 0.0–0.5)
Eosinophils Relative: 0 %
HCT: 39.7 % (ref 36.0–46.0)
Hemoglobin: 12.1 g/dL (ref 12.0–15.0)
Immature Granulocytes: 1 %
Lymphocytes Relative: 17 %
Lymphs Abs: 4 10*3/uL (ref 0.7–4.0)
MCH: 29.9 pg (ref 26.0–34.0)
MCHC: 30.5 g/dL (ref 30.0–36.0)
MCV: 98 fL (ref 80.0–100.0)
Monocytes Absolute: 1 10*3/uL (ref 0.1–1.0)
Monocytes Relative: 4 %
Neutro Abs: 18.6 10*3/uL — ABNORMAL HIGH (ref 1.7–7.7)
Neutrophils Relative %: 78 %
Platelets: 435 10*3/uL — ABNORMAL HIGH (ref 150–400)
RBC: 4.05 MIL/uL (ref 3.87–5.11)
RDW: 13.5 % (ref 11.5–15.5)
WBC: 24 10*3/uL — ABNORMAL HIGH (ref 4.0–10.5)
nRBC: 0 % (ref 0.0–0.2)

## 2020-07-05 LAB — PROCALCITONIN: Procalcitonin: 0.18 ng/mL

## 2020-07-05 LAB — DIGOXIN LEVEL: Digoxin Level: 0.3 ng/mL — ABNORMAL LOW (ref 0.8–2.0)

## 2020-07-05 LAB — URINALYSIS, ROUTINE W REFLEX MICROSCOPIC
Bacteria, UA: NONE SEEN
Bilirubin Urine: NEGATIVE
Glucose, UA: NEGATIVE mg/dL
Hgb urine dipstick: NEGATIVE
Ketones, ur: NEGATIVE mg/dL
Leukocytes,Ua: NEGATIVE
Nitrite: POSITIVE — AB
Protein, ur: NEGATIVE mg/dL
Specific Gravity, Urine: 1.018 (ref 1.005–1.030)
pH: 5 (ref 5.0–8.0)

## 2020-07-05 LAB — AMMONIA: Ammonia: 24 umol/L (ref 9–35)

## 2020-07-05 LAB — GLUCOSE, CAPILLARY: Glucose-Capillary: 63 mg/dL — ABNORMAL LOW (ref 70–99)

## 2020-07-05 LAB — RAPID URINE DRUG SCREEN, HOSP PERFORMED
Amphetamines: NOT DETECTED
Barbiturates: NOT DETECTED
Benzodiazepines: POSITIVE — AB
Cocaine: NOT DETECTED
Opiates: NOT DETECTED
Tetrahydrocannabinol: NOT DETECTED

## 2020-07-05 LAB — MRSA PCR SCREENING
MRSA by PCR: NEGATIVE
MRSA by PCR: NEGATIVE

## 2020-07-05 LAB — VALPROIC ACID LEVEL: Valproic Acid Lvl: 24 ug/mL — ABNORMAL LOW (ref 50.0–100.0)

## 2020-07-05 LAB — SARS CORONAVIRUS 2 BY RT PCR (HOSPITAL ORDER, PERFORMED IN ~~LOC~~ HOSPITAL LAB): SARS Coronavirus 2: NEGATIVE

## 2020-07-05 LAB — ETHANOL: Alcohol, Ethyl (B): <10 mg/dL (ref <10)

## 2020-07-05 LAB — LACTIC ACID, PLASMA: Lactic Acid, Venous: 1.2 mmol/L (ref 0.5–1.9)

## 2020-07-05 MED ORDER — SODIUM CHLORIDE 0.9 % IV SOLN: INTRAVENOUS | Status: DC

## 2020-07-05 MED ORDER — INSULIN GLARGINE 100 UNIT/ML ~~LOC~~ SOLN
25.0000 [IU] | Freq: Every day | SUBCUTANEOUS | Status: DC
  Administered 2020-07-06: 25 [IU] via SUBCUTANEOUS
  Filled 2020-07-05 (×2): qty 0.25

## 2020-07-05 MED ORDER — CLOPIDOGREL BISULFATE 75 MG PO TABS
75.0000 mg | ORAL_TABLET | Freq: Every day | ORAL | Status: DC
  Administered 2020-07-06: 75 mg via ORAL
  Filled 2020-07-05: qty 1

## 2020-07-05 MED ORDER — HEPARIN SODIUM (PORCINE) 5000 UNIT/ML IJ SOLN
5000.0000 [IU] | Freq: Three times a day (TID) | INTRAMUSCULAR | Status: DC
  Administered 2020-07-05 – 2020-07-06 (×2): 5000 [IU] via SUBCUTANEOUS
  Filled 2020-07-05 (×2): qty 1

## 2020-07-05 MED ORDER — ACETAMINOPHEN 650 MG RE SUPP: 650.0000 mg | Freq: Four times a day (QID) | RECTAL | Status: DC | PRN

## 2020-07-05 MED ORDER — DIVALPROEX SODIUM 250 MG PO DR TAB
250.0000 mg | DELAYED_RELEASE_TABLET | Freq: Two times a day (BID) | ORAL | Status: DC
  Administered 2020-07-05 – 2020-07-06 (×2): 250 mg via ORAL
  Filled 2020-07-05 (×2): qty 1

## 2020-07-05 MED ORDER — TRAMADOL HCL 50 MG PO TABS
50.0000 mg | ORAL_TABLET | Freq: Two times a day (BID) | ORAL | Status: DC | PRN
  Filled 2020-07-05: qty 1

## 2020-07-05 MED ORDER — METOPROLOL-HCTZ ER 25-12.5 MG PO TB24: 1.0000 | ORAL_TABLET | Freq: Two times a day (BID) | ORAL | Status: DC

## 2020-07-05 MED ORDER — METOPROLOL SUCCINATE ER 25 MG PO TB24
25.0000 mg | ORAL_TABLET | Freq: Two times a day (BID) | ORAL | Status: DC
  Administered 2020-07-05 – 2020-07-06 (×2): 25 mg via ORAL
  Filled 2020-07-05 (×2): qty 1

## 2020-07-05 MED ORDER — HYDROCHLOROTHIAZIDE 12.5 MG PO CAPS
12.5000 mg | ORAL_CAPSULE | Freq: Two times a day (BID) | ORAL | Status: DC
  Administered 2020-07-05: 12.5 mg via ORAL
  Filled 2020-07-05 (×2): qty 1

## 2020-07-05 MED ORDER — ACETAMINOPHEN 325 MG PO TABS: 650.0000 mg | ORAL_TABLET | Freq: Four times a day (QID) | ORAL | Status: DC | PRN

## 2020-07-05 MED ORDER — ARIPIPRAZOLE 5 MG PO TABS
5.0000 mg | ORAL_TABLET | Freq: Every day | ORAL | Status: DC
  Administered 2020-07-06: 5 mg via ORAL
  Filled 2020-07-05: qty 1

## 2020-07-05 MED ORDER — VITAMIN B-12 1000 MCG PO TABS
1000.0000 ug | ORAL_TABLET | Freq: Every day | ORAL | Status: DC
  Administered 2020-07-06: 1000 ug via ORAL
  Filled 2020-07-05: qty 1

## 2020-07-05 MED ORDER — DULOXETINE HCL 60 MG PO CPEP
60.0000 mg | ORAL_CAPSULE | Freq: Two times a day (BID) | ORAL | Status: DC
  Administered 2020-07-05 – 2020-07-06 (×2): 60 mg via ORAL
  Filled 2020-07-05 (×2): qty 1

## 2020-07-05 MED ORDER — INSULIN ASPART 100 UNIT/ML ~~LOC~~ SOLN
0.0000 [IU] | Freq: Three times a day (TID) | SUBCUTANEOUS | Status: DC
  Administered 2020-07-06 (×2): 3 [IU] via SUBCUTANEOUS

## 2020-07-05 MED ORDER — MELATONIN 3 MG PO TABS
3.0000 mg | ORAL_TABLET | Freq: Every day | ORAL | Status: DC
  Administered 2020-07-05: 3 mg via ORAL
  Filled 2020-07-05: qty 1

## 2020-07-05 MED ORDER — INSULIN ASPART 100 UNIT/ML ~~LOC~~ SOLN: 0.0000 [IU] | Freq: Every day | SUBCUTANEOUS | Status: DC

## 2020-07-05 MED ORDER — QUETIAPINE FUMARATE 25 MG PO TABS
25.0000 mg | ORAL_TABLET | Freq: Two times a day (BID) | ORAL | Status: DC
  Administered 2020-07-05 – 2020-07-06 (×2): 25 mg via ORAL
  Filled 2020-07-05 (×2): qty 1

## 2020-07-05 MED ORDER — PANTOPRAZOLE SODIUM 20 MG PO TBEC
20.0000 mg | DELAYED_RELEASE_TABLET | Freq: Every day | ORAL | Status: DC
  Filled 2020-07-05 (×2): qty 1

## 2020-07-05 MED ORDER — NITROGLYCERIN 0.4 MG SL SUBL: 0.4000 mg | SUBLINGUAL_TABLET | SUBLINGUAL | Status: DC | PRN

## 2020-07-05 MED ORDER — FENOFIBRATE 160 MG PO TABS
160.0000 mg | ORAL_TABLET | Freq: Every day | ORAL | Status: DC
  Administered 2020-07-06: 160 mg via ORAL
  Filled 2020-07-05: qty 1

## 2020-07-05 MED ORDER — ATORVASTATIN CALCIUM 40 MG PO TABS
80.0000 mg | ORAL_TABLET | Freq: Every day | ORAL | Status: DC
  Administered 2020-07-06: 80 mg via ORAL
  Filled 2020-07-05: qty 2

## 2020-07-05 MED ORDER — DIGOXIN 125 MCG PO TABS
250.0000 ug | ORAL_TABLET | Freq: Every day | ORAL | Status: DC
  Administered 2020-07-06: 250 ug via ORAL
  Filled 2020-07-05: qty 2

## 2020-07-05 MED ORDER — ALPRAZOLAM 0.5 MG PO TABS
0.5000 mg | ORAL_TABLET | Freq: Two times a day (BID) | ORAL | Status: DC
  Administered 2020-07-05 – 2020-07-06 (×2): 0.5 mg via ORAL
  Filled 2020-07-05 (×2): qty 1

## 2020-07-05 MED ORDER — ASPIRIN EC 81 MG PO TBEC
81.0000 mg | DELAYED_RELEASE_TABLET | Freq: Every day | ORAL | Status: DC
  Administered 2020-07-06: 81 mg via ORAL
  Filled 2020-07-05: qty 1

## 2020-07-05 NOTE — ED Triage Notes (Signed)
Pt was [facility]. Staff called EMS for AMS.  Stated to EMS that she was talking pt to this morning and was "staring off in space"  Per pt, she states " I always do that"  Staff also states that pt is positive for amphetamines. Unsure of how pt came in contact but suspicious that visitor giving it to pt

## 2020-07-05 NOTE — H&P (Signed)
TRH H&P   Patient Demographics:    [name], is a 54 y.o. female  MRN: [id number]   DOB - 05-05-1966  Admit Date - 07/05/2020  Outpatient Primary MD for the patient is Caprice Renshaw, MD  Referring MD/NP/PA: Dr Meade Maw   Patient coming from: SNF [facility]).  Chief Complaint  Patient presents with   Altered Mental Status      HPI:    [name]  is a 54 y.o. 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, type 2 diabetes mellitus, hypertension, anxiety, depression, history of CVA with residual left-sided weakness, patient was brought to ED from SNF [facility]) episode of altered mental status, where she was staring off into space, patient denies any complaints, reports she was feeling fine, there was no seizure activity, no urinary or stool incontinence (patient with urine incontinence at baseline), as well patient complains of chronic lower back pain, she is with known sacral pressure ulcer, but appears to be at baseline, actually improving, with no discharge or foul smelling odor, she denies fever, she denies chills, she denies cough, and apparently urine drug screen and at the facility showing possible amphetamine) that was negative here), so she was sent for verification, patient herself denies any substance abuse, denies taking any medications other than her scheduled meds per facility. - in ED patient mentation at baseline, there was no acute neurological findings, CT head with no acute finding as well, labs were only significant for leukocytosis at 24K, but dirty UA, and chest x-ray with no evidence of infectious etiology, blood cultures were pending, procalcitonin low at 0.18, her valproic acid level low at 24, but she is on it for pain and insomnia, and not seizures, she denies any history of seizures, given leukocytosis ED requested asked to admit for  observation overnight.    Review of systems:    In addition to the HPI above, No Fever-chills, he was present with episode of transient altered mental status. No Headache, No changes with Vision or hearing, No problems swallowing food or Liquids, No Chest pain, Cough or Shortness of Breath, No Abdominal pain, No Nausea or Vommitting, Bowel movements are regular, No Blood in stool or Urine, No dysuria, No new skin rashes or bruises, No new joints pains-aches,  No new weakness, tingling, numbness in any extremity, No recent weight gain or loss, No polyuria, polydypsia or polyphagia, No significant Mental Stressors.  A full 10 point Review of Systems was done, except as stated above, all other Review of Systems were negative.   With Past History of the following :    Past Medical History:  Diagnosis Date   Anemia    Anxiety    Asthma    Cigarette smoker 02/25/2014   COPD (chronic obstructive pulmonary disease) (HCC)    Diabetes mellitus without complication (HCC)    GERD (gastroesophageal reflux disease)  Hx of ischemic right MCA stroke 02/25/2014   Hypertension    MI (myocardial infarction) ([place])    Obesity, unspecified 02/25/2014   Stroke [facility])       Past Surgical History:  Procedure Laterality Date   ABOVE KNEE LEG AMPUTATION Bilateral    ANGIOPLASTY        Social History:     Social History   Tobacco Use   Smoking status: Current Every Day Smoker    Packs/day: 0.50    Types: Cigarettes   Smokeless tobacco: Never Used  Substance Use Topics   Alcohol use: No      Family History :     Family History  Problem Relation Age of Onset   Vascular Disease Other      Home Medications:   Prior to Admission medications   Medication Sig Start Date End Date Taking? Authorizing Provider  acetaminophen (TYLENOL) 500 MG tablet Take 1,000 mg by mouth 2 (two) times daily.   Yes [provider]  ALPRAZolam Duanne Moron) 0.5 MG tablet Take  0.5 mg by mouth 2 (two) times daily.    Yes [provider]  ARIPiprazole (ABILIFY) 5 MG tablet Take 5 mg by mouth daily. 12/13/19  Yes [provider]  aspirin EC 81 MG tablet Take 81 mg by mouth daily. Swallow whole.   Yes [provider]  atorvastatin (LIPITOR) 80 MG tablet Take 80 mg by mouth daily.   Yes [provider]  azithromycin (ZITHROMAX) 250 MG tablet Take 250 mg by mouth daily.   Yes [provider]  Baclofen 5 MG TABS Take 1 tablet by mouth 3 (three) times daily.    Yes [provider]  calcium carbonate (TUMS - DOSED IN MG ELEMENTAL CALCIUM) 500 MG chewable tablet Chew 2 tablets by mouth at bedtime.   Yes [provider]  clopidogrel (PLAVIX) 75 MG tablet Take 75 mg by mouth daily. 11/26/19  Yes [provider]  Cranberry 450 MG CAPS Take 2 tablets by mouth 2 (two) times daily.   Yes [provider]  digoxin (LANOXIN) 0.25 MG tablet Take 250 mcg by mouth daily.    Yes [provider]  divalproex (DEPAKOTE) 125 MG DR tablet Take 250 mg by mouth 2 (two) times daily.  12/19/19  Yes [provider]  DULoxetine (CYMBALTA) 60 MG capsule Take 60 mg by mouth 2 (two) times daily.   Yes [provider]  fenofibrate 160 MG tablet Take 160 mg by mouth daily. 11/09/19  Yes [provider]  insulin glargine (LANTUS) 100 UNIT/ML injection Inject 40 Units into the skin daily.    Yes [provider]  insulin lispro (HUMALOG) 100 UNIT/ML injection Inject 2-15 Units into the skin in the morning, at noon, in the evening, and at bedtime. Before meals and at bedtime. Per sliding scale   Yes [provider]  lidocaine (LIDODERM) 5 % Place 3 patches onto the skin daily. Remove & Discard patch within 12 hours to lower back and each knee stump. (1 patch for each body part)   Yes [provider]  melatonin 3 MG TABS tablet Take 3 mg by mouth at bedtime.   Yes [provider]  methenamine (HIPREX) 1 g tablet Take 1 g by mouth daily.   Yes [provider]  Metoprolol-Hydrochlorothiazide 25-12.5 MG TB24 Take 1 tablet by mouth 2 (two) times daily.   Yes [provider]  Multiple Vitamin (MULTIVITAMIN) tablet Take 1 tablet by  mouth daily.   Yes [provider]  nitroGLYCERIN (NITROSTAT) 0.4 MG SL tablet Place 0.4 mg under the tongue every 5 (five) minutes as needed for chest pain.   Yes [provider]  ondansetron (ZOFRAN) 4 MG tablet Take 1 tablet (4 mg total) by mouth every 8 (eight) hours as needed for nausea or vomiting. 03/06/20  Yes Rolland Porter, MD  pantoprazole (PROTONIX) 20 MG tablet Take 20 mg by mouth daily.   Yes [provider]  polyethylene glycol (MIRALAX / GLYCOLAX) 17 g packet Take 17 g by mouth daily as needed.    Yes [provider]  Potassium Chloride ER 20 MEQ TBCR Take 2 tablets by mouth daily.   Yes [provider]  pregabalin (LYRICA) 75 MG capsule Take 75 mg by mouth 3 (three) times daily.   Yes [provider]  QUEtiapine (SEROQUEL) 25 MG tablet Take 25 mg by mouth 2 (two) times daily.   Yes [provider]  Semaglutide, 1 MG/DOSE, (OZEMPIC, 1 MG/DOSE,) 2 MG/1.5ML SOPN Inject 1 mg into the skin once a week. On fridays 12/10/19  Yes [provider]  traMADol (ULTRAM) 50 MG tablet Take 100 mg by mouth every 8 (eight) hours.   Yes [provider]  vitamin B-12 (CYANOCOBALAMIN) 500 MCG tablet Take 1,000 mcg by mouth daily.   Yes [provider]  cefUROXime (CEFTIN) 500 MG tablet Take 1 tablet (500 mg total) by mouth 2 (two) times daily with a meal. Patient not taking: Reported on 12/21/2019 12/23/15   Dustin Flock, MD  Ensure (ENSURE) Take 237 mLs by mouth 2 (two) times daily between meals. Patient not taking: Reported on 07/05/2020    [provider]  fentaNYL (DURAGESIC - DOSED MCG/HR) 50 MCG/HR Place 1 patch (50 mcg  total) onto the skin every 3 (three) days. Patient not taking: Reported on 12/21/2019 12/23/15   Dustin Flock, MD  FLUoxetine (PROZAC) 20 MG tablet Take 20 mg by mouth daily. Patient not taking: Reported on 07/05/2020    [provider]  gabapentin (NEURONTIN) 800 MG tablet Take 300 mg by mouth 3 (three) times daily.  Patient not taking: Reported on 07/05/2020    [provider]  metFORMIN (GLUCOPHAGE) 500 MG tablet Take 500 mg by mouth 2 (two) times daily with a meal. Patient not taking: Reported on 07/05/2020    [provider]     Allergies:     Allergies  Allergen Reactions   Ace Inhibitors    Flexeril [Cyclobenzaprine]    Morphine Other (See Comments)    PO form causes reaction Other reaction(s): SHORTNESS OF BREATH PO form causes reaction Other reaction(s): SHORTNESS OF BREATH    Paxil [Paroxetine Hcl]    Silver Sulfadiazine Dermatitis     Physical Exam:   Vitals  Blood pressure 113/70, pulse (!) 115, temperature 98.1 F (36.7 C), temperature source Oral, resp. rate 20, height 3' (0.914 m), weight 60.3 kg, SpO2 91 %.   1. General well developed female, laying in bed, lying in bed in NAD,    2. Normal affect and insight, Not Suicidal or Homicidal, Awake Alert, Oriented X 3.  3. No F.N deficits, ALL C.Nerves Intact, patient has chronic left-sided weakness, right upper extremity 5 out of 5.  Bilateral AKA.  4. Ears and Eyes appear Normal, Conjunctivae clear, PERRLA. Moist Oral Mucosa.  5. Supple Neck, No JVD, No cervical lymphadenopathy appriciated, No Carotid Bruits.  6. Symmetrical Chest wall movement, Good air movement  bilaterally, CTAB.  7. RRR, No Gallops, Rubs or Murmurs, No Parasternal Heave.  8. Positive Bowel Sounds, Abdomen Soft, No tenderness, No organomegaly appriciated,No rebound -guarding or rigidity.  9.  No Cyanosis, Normal Skin Turgor, blood pressure ulcer, please see picture below  10. Good muscle tone,  joints  appear normal , no effusions, Normal ROM.  Has bilateral AKA  11. No Palpable Lymph Nodes in Neck or Axillae   Data Review:    CBC Recent Labs  Lab 07/05/20 1051  WBC 24.0*  HGB 12.1  HCT 39.7  PLT 435*  MCV 98.0  MCH 29.9  MCHC 30.5  RDW 13.5  LYMPHSABS 4.0  MONOABS 1.0  EOSABS 0.1  BASOSABS 0.1   ------------------------------------------------------------------------------------------------------------------  Chemistries  Recent Labs  Lab 07/05/20 1051  NA 134*  K 4.0  CL 96*  CO2 26  GLUCOSE 129*  BUN 11  CREATININE 0.48  CALCIUM 8.9  AST 35  ALT 22  ALKPHOS 101  BILITOT 0.6   ------------------------------------------------------------------------------------------------------------------ estimated creatinine clearance is 23.5 mL/min (by C-G formula based on SCr of 0.48 mg/dL). ------------------------------------------------------------------------------------------------------------------ No results for input(s): TSH, T4TOTAL, T3FREE, THYROIDAB in the last 72 hours.  Invalid input(s): FREET3  Coagulation profile No results for input(s): INR, PROTIME in the last 168 hours. ------------------------------------------------------------------------------------------------------------------- No results for input(s): DDIMER in the last 72 hours. -------------------------------------------------------------------------------------------------------------------  Cardiac Enzymes No results for input(s): CKMB, TROPONINI, MYOGLOBIN in the last 168 hours.  Invalid input(s): CK ------------------------------------------------------------------------------------------------------------------ No results found for: BNP   ---------------------------------------------------------------------------------------------------------------  Urinalysis    Component Value Date/Time   COLORURINE YELLOW 07/05/2020 1455   APPEARANCEUR CLEAR 07/05/2020 1455   LABSPEC  1.018 07/05/2020 1455   PHURINE 5.0 07/05/2020 1455   GLUCOSEU NEGATIVE 07/05/2020 1455   HGBUR NEGATIVE 07/05/2020 1455   BILIRUBINUR NEGATIVE 07/05/2020 1455   KETONESUR NEGATIVE 07/05/2020 1455   PROTEINUR NEGATIVE 07/05/2020 1455   UROBILINOGEN 0.2 02/23/2014 1542   NITRITE POSITIVE (A) 07/05/2020 1455   LEUKOCYTESUR NEGATIVE 07/05/2020 1455    ----------------------------------------------------------------------------------------------------------------   Imaging Results:    DG Chest Portable 1 View  Result Date: 07/05/2020 CLINICAL DATA:  Back pain.  Fall from bed EXAM: PORTABLE CHEST 1 VIEW COMPARISON:  06/01/2020 FINDINGS: The heart size and mediastinal contours are within normal limits. Age advanced aortic atherosclerosis. Linear opacities within the periphery of the left mid lung and left lung base favor atelectasis. Chronic coarsened interstitial markings bilaterally. No pleural effusion or pneumothorax identified. The visualized skeletal structures are unremarkable. IMPRESSION: 1. Linear opacities within the periphery of the left mid lung and left lung base, favor atelectasis. Otherwise no acute cardiopulmonary findings. 2. Age advanced aortic atherosclerosis. Electronically Signed   By: Davina Poke D.O.   On: 07/05/2020 12:09     Assessment & Plan:    Active Problems:   Type 2 diabetes mellitus (HCC)   HTN (hypertension)   Hx of ischemic right MCA stroke, 1998   COPD (chronic obstructive pulmonary disease) (HCC)   GERD (gastroesophageal reflux disease)   Chronic systolic congestive heart failure (HCC)   Cardiomyopathy, ischemic   Encephalopathy acute   Acute encephalopathy -Patient had a transient episode of altered mental status at the facility, CT head with no acute findings, her work-up with no acute findings to explain this altered mental status, there was no evidence of seizures as well. -She does have some leukocytosis, but there is no evidence of  infectious process. -She will be observed overnight.  Leukocytosis -She is nontoxic-appearing, procalcitonin low  at 0.18, continue to trend, negative urinalysis, chest x-ray with no evidence of infections, blood cultures has been obtained, will monitor off antibiotics as she is nontoxic-appearing and I see no indications for antibiotics. -Repeate  CBC in a.m.  Hypertension -Continue with home medications including chlorothiazide  History of CVA/CAD -Continue with statin, Plavix and aspirin  History of depression - continue with Abilify, Depakote, Cymbalta and Seroquel ry of schizoaffective disorder.  Hypothyroidism -Continue with Synthroid  COPD -No active wheezing, continue with nebs as needed  Type 2 diabetes mellitus -We will lower her Lantus at 40 units, and continue with insulin sliding scale  Pressure ulcers -Please see pictures , continue with wound care  GERD -Continue with PPI   DVT Prophylaxis Heparin   AM Labs Ordered, also please review Full Orders  Family Communication: Admission, patients condition and plan of care including tests being ordered have been discussed with the patient  who indicate understanding and agree with the plan and Code Status.  Code Status Full  Likely DC to  Back to SNF   Condition GUARDED    Consults called: None  Admission status:   Observation  Time spent in minutes : 50 minutes   Phillips Climes M.D on 07/05/2020 at 7:17 PM   [facility] - Office  [phone]

## 2020-07-05 NOTE — ED Provider Notes (Signed)
[facility] EMERGENCY DEPARTMENT Provider Note   CSN: [id number] Arrival date & time: 07/05/20  1001     History Chief Complaint  Patient presents with   Altered Mental Status    [name] is a 54 y.o. female.  HPI Patient presents from the [place] home.  Reportedly was altered and staring off into space.  Patient states she is feeling fine nothing was off.  States she has pain in her rear end but states that it is typical for her.  Nursing also called and said that they did a urinalysis and drug screen that showed possible amphetamine.  It is reportedly being sent out for verification.  Patient denies any substance use.  Denies taking any medicines besides what she is given.  Previous bilateral lower leg amputation.    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[place]) 27/78/2423   Chronic systolic congestive heart failure ([place]) 10/24/2016   Cardiomyopathy, ischemic 10/24/2016   Sinus tachycardia 10/24/2016   Sepsis ([place]) 12/20/2015   UTI (lower urinary tract infection) 12/20/2015   COPD (chronic obstructive pulmonary disease) ([place]) 12/20/2015   GERD (gastroesophageal reflux disease) 12/20/2015   Type 2 diabetes mellitus ([place]) 02/25/2014   HTN (hypertension) 02/25/2014   Cigarette smoker 02/25/2014   Obesity 02/25/2014   Hx of ischemic right MCA stroke, 1998 02/25/2014   Cerebral infarction ([place]) 02/20/2014    Past Surgical History:    Procedure Laterality Date   ABOVE KNEE LEG AMPUTATION Bilateral    ANGIOPLASTY       OB History   No obstetric history on file.     Family History  Problem Relation Age of Onset   Vascular Disease Other     Social History   Tobacco Use   Smoking status: Current Every Day Smoker    Packs/day: 0.50    Types: Cigarettes   Smokeless tobacco: Never Used  Vaping Use   Vaping Use: Never used  Substance Use Topics   Alcohol use: No   Drug use: No    Home Medications Prior to Admission medications   Medication Sig Start Date End Date Taking? Authorizing Provider  acetaminophen (TYLENOL) 500 MG tablet Take 1,000 mg by mouth 2 (two) times daily.   Yes [provider]  ALPRAZolam Duanne Moron) 0.5 MG tablet Take 0.5 mg by mouth 2 (two) times daily.    Yes [provider]  ARIPiprazole (ABILIFY) 5 MG tablet Take 5 mg by mouth daily. 12/13/19  Yes [provider]  aspirin EC 81 MG tablet Take 81 mg by mouth daily. Swallow whole.   Yes [provider]  atorvastatin (LIPITOR) 80 MG tablet Take 80 mg by mouth daily.   Yes [provider]  azithromycin (ZITHROMAX) 250 MG tablet Take 250 mg by mouth daily.   Yes [provider]  Baclofen 5 MG TABS Take 1 tablet by mouth 3 (three) times daily.  Yes [provider]  calcium carbonate (TUMS - DOSED IN MG ELEMENTAL CALCIUM) 500 MG chewable tablet Chew 2 tablets by mouth at bedtime.   Yes [provider]  clopidogrel (PLAVIX) 75 MG tablet Take 75 mg by mouth daily. 11/26/19  Yes [provider]  Cranberry 450 MG CAPS Take 2 tablets by mouth 2 (two) times daily.   Yes [provider]  digoxin (LANOXIN) 0.25 MG tablet Take 250 mcg by mouth daily.    Yes [provider]  divalproex (DEPAKOTE) 125 MG DR tablet Take 250 mg by mouth 2 (two) times daily.  12/19/19  Yes [provider]  DULoxetine (CYMBALTA) 60 MG capsule Take 60 mg by mouth  2 (two) times daily.   Yes [provider]  fenofibrate 160 MG tablet Take 160 mg by mouth daily. 11/09/19  Yes [provider]  insulin glargine (LANTUS) 100 UNIT/ML injection Inject 40 Units into the skin daily.    Yes [provider]  insulin lispro (HUMALOG) 100 UNIT/ML injection Inject 2-15 Units into the skin in the morning, at noon, in the evening, and at bedtime. Before meals and at bedtime. Per sliding scale   Yes [provider]  lidocaine (LIDODERM) 5 % Place 3 patches onto the skin daily. Remove & Discard patch within 12 hours to lower back and each knee stump. (1 patch for each body part)   Yes [provider]  melatonin 3 MG TABS tablet Take 3 mg by mouth at bedtime.   Yes [provider]  methenamine (HIPREX) 1 g tablet Take 1 g by mouth daily.   Yes [provider]  Metoprolol-Hydrochlorothiazide 25-12.5 MG TB24 Take 1 tablet by mouth 2 (two) times daily.   Yes [provider]  Multiple Vitamin (MULTIVITAMIN) tablet Take 1 tablet by mouth daily.   Yes [provider]  nitroGLYCERIN (NITROSTAT) 0.4 MG SL tablet Place 0.4 mg under the tongue every 5 (five) minutes as needed for chest pain.   Yes [provider]  ondansetron (ZOFRAN) 4 MG tablet Take 1 tablet (4 mg total) by mouth every 8 (eight) hours as needed for nausea or vomiting. 03/06/20  Yes Rolland Porter, MD  pantoprazole (PROTONIX) 20 MG tablet Take 20 mg by mouth daily.   Yes [provider]  polyethylene glycol (MIRALAX / GLYCOLAX) 17 g packet Take 17 g by mouth daily as needed.    Yes [provider]  Potassium Chloride ER 20 MEQ TBCR Take 2 tablets by mouth daily.   Yes [provider]  pregabalin (LYRICA) 75 MG capsule Take 75 mg by mouth 3 (three) times daily.   Yes [provider]  QUEtiapine (SEROQUEL) 25 MG tablet Take 25 mg by mouth 2 (two) times daily.   Yes [provider]  Semaglutide, 1  MG/DOSE, (OZEMPIC, 1 MG/DOSE,) 2 MG/1.5ML SOPN Inject 1 mg into the skin once a week. On fridays 12/10/19  Yes [provider]  traMADol (ULTRAM) 50 MG tablet Take 100 mg by mouth every 8 (eight) hours.   Yes [provider]  vitamin B-12 (CYANOCOBALAMIN) 500 MCG tablet Take 1,000 mcg by mouth daily.   Yes [provider]  cefUROXime (CEFTIN) 500 MG tablet Take 1 tablet (500 mg total) by mouth 2 (two) times daily with a meal. Patient not taking: Reported on 12/21/2019 12/23/15   Dustin Flock, MD  Ensure (ENSURE) Take 237 mLs by mouth 2 (two) times daily between meals. Patient not taking:  Reported on 07/05/2020    [provider]  fentaNYL (DURAGESIC - DOSED MCG/HR) 50 MCG/HR Place 1 patch (50 mcg total) onto the skin every 3 (three) days. Patient not taking: Reported on 12/21/2019 12/23/15   Dustin Flock, MD  FLUoxetine (PROZAC) 20 MG tablet Take 20 mg by mouth daily. Patient not taking: Reported on 07/05/2020    [provider]  gabapentin (NEURONTIN) 800 MG tablet Take 300 mg by mouth 3 (three) times daily.  Patient not taking: Reported on 07/05/2020    [provider]  metFORMIN (GLUCOPHAGE) 500 MG tablet Take 500 mg by mouth 2 (two) times daily with a meal. Patient not taking: Reported on 07/05/2020    [provider]    Allergies    Ace inhibitors, Flexeril [cyclobenzaprine], Morphine, Paxil [paroxetine hcl], and Silver sulfadiazine  Review of Systems   Review of Systems  Constitutional: Negative for appetite change, fatigue and fever.  Respiratory: Negative for shortness of breath.   Cardiovascular: Negative for chest pain.  Gastrointestinal: Negative for abdominal pain.  Genitourinary: Negative for flank pain.  Musculoskeletal: Negative for back pain.  Neurological: Positive for weakness.  Psychiatric/Behavioral: Positive for confusion.    Physical Exam Updated Vital Signs BP 113/70    Pulse (!) 115    Temp 98.1 F  (36.7 C) (Oral)    Resp 20    Ht 3' (0.914 m)    Wt 60.3 kg    SpO2 91%    BMI 72.15 kg/m   Physical Exam Vitals reviewed.  Constitutional:      Appearance: Normal appearance.  HENT:     Head: Atraumatic.  Eyes:     Extraocular Movements: Extraocular movements intact.  Cardiovascular:     Comments: Mild tachycardia. Pulmonary:     Breath sounds: No wheezing or rhonchi.  Abdominal:     Tenderness: There is no abdominal tenderness.  Musculoskeletal:     Cervical back: Neck supple.     Comments: Previous bilateral lower leg amputation.  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  Skin:    General: Skin is warm.     Capillary Refill: Capillary refill takes less than 2 seconds.  Neurological:     Mental Status: She is alert.     Comments: Patient is awake and able answer questions.   Patient has sacral decubitus ulcer.  Unclear depth but is rather narrow.  ED Results / Procedures / Treatments   Labs (all labs ordered are listed, but only abnormal results are displayed) Labs Reviewed  RAPID URINE DRUG SCREEN, HOSP PERFORMED - Abnormal; Notable for the following components:      Result Value   Benzodiazepines POSITIVE (*)    All other components within normal limits  CBC WITH DIFFERENTIAL/PLATELET - Abnormal; Notable for the following components:   WBC 24.0 (*)    Platelets 435 (*)    Neutro Abs 18.6 (*)    Abs Immature Granulocytes 0.23 (*)    All other components within normal limits  DIGOXIN LEVEL - Abnormal; Notable for the following components:   Digoxin Level 0.3 (*)    All other components within normal limits  VALPROIC ACID LEVEL - Abnormal; Notable for the following components:   Valproic Acid Lvl 24 (*)    All other components within normal limits  COMPREHENSIVE METABOLIC PANEL - Abnormal; Notable for the following components:   Sodium 134 (*)    Chloride 96 (*)    Glucose, Bld 129 (*)    Albumin 2.4 (*)  All other components within normal limits  CULTURE, BLOOD  (ROUTINE X 2)  CULTURE, BLOOD (ROUTINE X 2)  ETHANOL  AMMONIA  URINALYSIS, ROUTINE W REFLEX MICROSCOPIC  LACTIC ACID, PLASMA  LACTIC ACID, PLASMA    EKG None  Radiology DG Chest Portable 1 View  Result Date: 07/05/2020 CLINICAL DATA:  Back pain.  Fall from bed EXAM: PORTABLE CHEST 1 VIEW COMPARISON:  06/01/2020 FINDINGS: The heart size and mediastinal contours are within normal limits. Age advanced aortic atherosclerosis. Linear opacities within the periphery of the left mid lung and left lung base favor atelectasis. Chronic coarsened interstitial markings bilaterally. No pleural effusion or pneumothorax identified. The visualized skeletal structures are unremarkable. IMPRESSION: 1. Linear opacities within the periphery of the left mid lung and left lung base, favor atelectasis. Otherwise no acute cardiopulmonary findings. 2. Age advanced aortic atherosclerosis. Electronically Signed   By: Davina Poke D.O.   On: 07/05/2020 12:09    Procedures Procedures (including critical care time)  Medications Ordered in ED Medications - No data to display  ED Course  I have reviewed the triage vital signs and the nursing notes.  Pertinent labs & imaging results that were available during my care of the patient were reviewed by me and considered in my medical decision making (see chart for details).    MDM Rules/Calculators/A&P                         Patient sent in from nursing home for mental status changes.  Reportedly has been staring off.  Found to have a leukocytosis.  Reportedly is currently on antibiotics for UTI although MAR not sent with patient.  Urinalysis still pending but does have a leukocytosis of 24,000.  With either urine is the source or potentially her decubitus ulcer and currently on antibiotics it feels the patient would benefit from admission to the hospital.  Lactic acid and blood culture sent.  Will discuss with hospitalist.  Will likely need further evaluation of the  decubitus ulcer. Final Clinical Impression(s) / ED Diagnoses Final diagnoses:  Leukocytosis, unspecified type    Rx / DC Orders ED Discharge Orders    None       Davonna Belling, MD 07/05/20 [phone]

## 2020-07-05 NOTE — ED Notes (Signed)
ED TO INPATIENT HANDOFF REPORT  ED Nurse Name and Phone #:    S Name/Age/Gender [name] 54 y.o. female Room/Bed: APA10/APA10  Code Status   Code Status: Prior  Home/SNF/Other Nursing Home Patient oriented to: self, place, time and situation Is this baseline? Yes   Triage Complete: Triage complete  Chief Complaint Encephalopathy acute [G93.40]  T[facility] Note [facility] Staff called EMS for AMS.  Stated to EMS that she was talking pt to this morning and was "staring off in space"  Per pt, she states " I always do that"  Staff also states that pt is positive for amphetamines. Unsure of how pt came in contact but suspicious that visitor giving it to pt     Allergies Allergies  Allergen Reactions  . Ace Inhibitors   . Flexeril [Cyclobenzaprine]   . Morphine Other (See Comments)    PO form causes reaction Other reaction(s): SHORTNESS OF BREATH PO form causes reaction Other reaction(s): SHORTNESS OF BREATH   . Paxil [Paroxetine Hcl]   . Silver Sulfadiazine Dermatitis    Level of Care/Admitting Diagnosis ED Disposition    ED Disposition Condition [facility] Area: [facility] [[id number]]  Level of Care: Telemetry [5]  Covid Evaluation: Asymptomatic Screening Protocol (No Symptoms)  Diagnosis: Encephalopathy acute [379024]  Admitting Physician: Manfred Shirts  Attending Physician: Waldron Labs, DAWOOD S [[id number]]       B Medical/Surgery History Past Medical History:  Diagnosis Date  . Anemia   . Anxiety   . Asthma   . Cigarette smoker 02/25/2014  . COPD (chronic obstructive pulmonary disease) ([place])   . Diabetes mellitus without complication ([place])   . GERD (gastroesophageal reflux disease)   . Hx of ischemic right MCA stroke 02/25/2014  . Hypertension   . MI (myocardial infarction) ([place])   . Obesity, unspecified 02/25/2014  . Stroke [facility])    Past Surgical History:  Procedure Laterality Date  . ABOVE KNEE LEG  AMPUTATION Bilateral   . ANGIOPLASTY       A IV Location/Drains/Wounds Patient Lines/Drains/Airways Status    Active Line/Drains/Airways    Name Placement date Placement time Site Days   Peripheral IV 07/05/20 Right Wrist 07/05/20  1838  Wrist  less than 1   External Urinary Catheter 07/05/20  1138  --  less than 1   Pressure Injury 09/04/18 Stage III -  Full thickness tissue loss. Subcutaneous fat may be visible but bone, tendon or muscle are NOT exposed. Wound is non-blanching, clean, dry, appears to be healing.  Wound is covered with appropriate dressing in place 09/04/18  2135   670          Intake/Output Last 24 hours No intake or output data in the 24 hours ending 07/05/20 2043  Labs/Imaging Results for orders placed or performed during the hospital encounter of 07/05/20 (from the past 48 hour(s))  Ethanol     Status: None   Collection Time: 07/05/20 10:51 AM  Result Value Ref Range   Alcohol, Ethyl (B) <10 <10 mg/dL    Comment: (NOTE) Lowest detectable limit for serum alcohol is 10 mg/dL.  For medical purposes only. Performed at [facility], [address], [place], [place] [address]   CBC with Differential     Status: Abnormal   Collection Time: 07/05/20 10:51 AM  Result Value Ref Range   WBC 24.0 (H) 4.0 - 10.5 K/uL   RBC 4.05 3.87 - 5.11 MIL/uL  Hemoglobin 12.1 12.0 - 15.0 g/dL   HCT 39.7 36 - 46 %   MCV 98.0 80.0 - 100.0 fL   MCH 29.9 26.0 - 34.0 pg   MCHC 30.5 30.0 - 36.0 g/dL   RDW 13.5 11.5 - 15.5 %   Platelets 435 (H) 150 - 400 K/uL   nRBC 0.0 0.0 - 0.2 %   Neutrophils Relative % 78 %   Neutro Abs 18.6 (H) 1.7 - 7.7 K/uL   Lymphocytes Relative 17 %   Lymphs Abs 4.0 0.7 - 4.0 K/uL   Monocytes Relative 4 %   Monocytes Absolute 1.0 0 - 1 K/uL   Eosinophils Relative 0 %   Eosinophils Absolute 0.1 0 - 0 K/uL   Basophils Relative 0 %   Basophils Absolute 0.1 0 - 0 K/uL   Immature Granulocytes 1 %   Abs Immature Granulocytes 0.23 (H) 0.00 - 0.07 K/uL     Comment: Performed at [facility], [address], [place], [place] [address]  Digoxin level     Status: Abnormal   Collection Time: 07/05/20 10:51 AM  Result Value Ref Range   Digoxin Level 0.3 (L) 0.8 - 2.0 ng/mL    Comment: Performed at [facility], [address], [place], [place] [address]  Valproic acid level     Status: Abnormal   Collection Time: 07/05/20 10:51 AM  Result Value Ref Range   Valproic Acid Lvl 24 (L) 50.0 - 100.0 ug/mL    Comment: Performed at [facility], [address], [place], [place] [address]  Ammonia     Status: None   Collection Time: 07/05/20 10:51 AM  Result Value Ref Range   Ammonia 24 9 - 35 umol/L    Comment: Performed at [facility], [address] [place],  [address]  Comprehensive metabolic panel     Status: Abnormal   Collection Time: 07/05/20 10:51 AM  Result Value Ref Range   Sodium 134 (L) 135 - 145 mmol/L   Potassium 4.0 3.5 - 5.1 mmol/L   Chloride 96 (L) 98 - 111 mmol/L   CO2 26 22 - 32 mmol/L   Glucose, Bld 129 (H) 70 - 99 mg/dL    Comment: Glucose reference range applies only to samples taken after fasting for at least 8 hours.   BUN 11 6 - 20 mg/dL   Creatinine, Ser 0.48 0.44 - 1.00 mg/dL   Calcium 8.9 8.9 - 10.3 mg/dL   Total Protein 6.9 6.5 - 8.1 g/dL   Albumin 2.4 (L) 3.5 - 5.0 g/dL   AST 35 15 - 41 U/L   ALT 22 0 - 44 U/L   Alkaline Phosphatase 101 38 - 126 U/L   Total Bilirubin 0.6 0.3 - 1.2 mg/dL   GFR calc non Af Amer >60 >60 mL/min   GFR calc Af Amer >60 >60 mL/min   Anion gap 12 5 - 15    Comment: Performed at [facility], [address] [place],  [address]  Procalcitonin - Baseline     Status: None   Collection Time: 07/05/20 10:51 AM  Result Value Ref Range   Procalcitonin 0.18 ng/mL    Comment:        Interpretation: PCT (Procalcitonin) <= 0.5 ng/mL: Systemic infection (sepsis) is not likely. Local bacterial infection is possible. (NOTE)       Sepsis PCT Algorithm           Lower Respiratory  Tract  Infection PCT Algorithm    ----------------------------     ----------------------------         PCT < 0.25 ng/mL                PCT < 0.10 ng/mL          Strongly encourage             Strongly discourage   discontinuation of antibiotics    initiation of antibiotics    ----------------------------     -----------------------------       PCT 0.25 - 0.50 ng/mL            PCT 0.10 - 0.25 ng/mL               OR       >80% decrease in PCT            Discourage initiation of                                            antibiotics      Encourage discontinuation           of antibiotics    ----------------------------     -----------------------------         PCT >= 0.50 ng/mL              PCT 0.26 - 0.50 ng/mL               AND        <80% decrease in PCT             Encourage initiation of                                             antibiotics       Encourage continuation           of antibiotics    ----------------------------     -----------------------------        PCT >= 0.50 ng/mL                  PCT > 0.50 ng/mL               AND         increase in PCT                  Strongly encourage                                      initiation of antibiotics    Strongly encourage escalation           of antibiotics                                     -----------------------------                                           PCT <= 0.25 ng/mL  OR                                        > 80% decrease in PCT                                      Discontinue / Do not initiate                                             antibiotics  Performed at [facility] [place], [place] [address]   Urinalysis, Routine w reflex microscopic     Status: Abnormal   Collection Time: 07/05/20  2:55 PM  Result Value Ref Range   Color, Urine YELLOW YELLOW   APPearance CLEAR CLEAR   Specific Gravity,  Urine 1.018 1.005 - 1.030   pH 5.0 5.0 - 8.0   Glucose, UA NEGATIVE NEGATIVE mg/dL   Hgb urine dipstick NEGATIVE NEGATIVE   Bilirubin Urine NEGATIVE NEGATIVE   Ketones, ur NEGATIVE NEGATIVE mg/dL   Protein, ur NEGATIVE NEGATIVE mg/dL   Nitrite POSITIVE (A) NEGATIVE   Leukocytes,Ua NEGATIVE NEGATIVE   RBC / HPF 0-5 0 - 5 RBC/hpf   WBC, UA 0-5 0 - 5 WBC/hpf   Bacteria, UA NONE SEEN NONE SEEN   Squamous Epithelial / LPF 0-5 0 - 5    Comment: Performed at [facility], [address], [place], [place] [address]  Urine rapid drug screen (hosp performed)     Status: Abnormal   Collection Time: 07/05/20  2:55 PM  Result Value Ref Range   Opiates NONE DETECTED NONE DETECTED   Cocaine NONE DETECTED NONE DETECTED   Benzodiazepines POSITIVE (A) NONE DETECTED   Amphetamines NONE DETECTED NONE DETECTED   Tetrahydrocannabinol NONE DETECTED NONE DETECTED   Barbiturates NONE DETECTED NONE DETECTED    Comment: (NOTE) DRUG SCREEN FOR MEDICAL PURPOSES ONLY.  IF CONFIRMATION IS NEEDED FOR ANY PURPOSE, NOTIFY LAB WITHIN 5 DAYS.  LOWEST DETECTABLE LIMITS FOR URINE DRUG SCREEN Drug Class                     Cutoff (ng/mL) Amphetamine and metabolites    1000 Barbiturate and metabolites    200 Benzodiazepine                 259 Tricyclics and metabolites     300 Opiates and metabolites        300 Cocaine and metabolites        300 THC                            50 Performed at [facility], [address] [place], [place] [address]   Culture, blood (routine x 2)     Status: None (Preliminary result)   Collection Time: 07/05/20  4:29 PM   Specimen: Right Antecubital; Blood  Result Value Ref Range   Specimen Description RIGHT ANTECUBITAL    Special Requests      BOTTLES DRAWN AEROBIC ONLY Blood Culture adequate volume Performed at [facility], [address] [place], [place] [address]    Culture PENDING    Report Status  PENDING   SARS Coronavirus 2 by RT PCR (hospital order, performed in  [facility] hospital lab) Nasopharyngeal Nasopharyngeal Swab     Status: None   Collection Time: 07/05/20  7:09 PM   Specimen: Nasopharyngeal Swab  Result Value Ref Range   SARS Coronavirus 2 NEGATIVE NEGATIVE    Comment: (NOTE) SARS-CoV-2 target nucleic acids are NOT DETECTED.  The SARS-CoV-2 RNA is generally detectable in upper and lower respiratory specimens during the acute phase of infection. The lowest concentration of SARS-CoV-2 viral copies this assay can detect is 250 copies / mL. A negative result does not preclude SARS-CoV-2 infection and should not be used as the sole basis for treatment or other patient management decisions.  A negative result may occur with improper specimen collection / handling, submission of specimen other than nasopharyngeal swab, presence of viral mutation(s) within the areas targeted by this assay, and inadequate number of viral copies (<250 copies / mL). A negative result must be combined with clinical observations, patient history, and epidemiological information.  Fact Sheet for Patients:   StrictlyIdeas.no  Fact Sheet for Healthcare Providers: BankingDealers.co.za  This test is not yet approved or  cleared by the [place] FDA and has been authorized for detection and/or diagnosis of SARS-CoV-2 by FDA under an Emergency Use Authorization (EUA).  This EUA will remain in effect (meaning this test can be used) for the duration of the COVID-19 declaration under Section 564(b)(1) of the Act, 21 U.S.C. section 360bbb-3(b)(1), unless the authorization is terminated or revoked sooner.  Performed at [facility], [address] [place], [place] [address]    DG Chest Portable 1 View  Result Date: 07/05/2020 CLINICAL DATA:  Back pain.  Fall from bed EXAM: PORTABLE CHEST 1 VIEW COMPARISON:  06/01/2020 FINDINGS: The heart size and mediastinal contours are within normal limits. Age advanced aortic  atherosclerosis. Linear opacities within the periphery of the left mid lung and left lung base favor atelectasis. Chronic coarsened interstitial markings bilaterally. No pleural effusion or pneumothorax identified. The visualized skeletal structures are unremarkable. IMPRESSION: 1. Linear opacities within the periphery of the left mid lung and left lung base, favor atelectasis. Otherwise no acute cardiopulmonary findings. 2. Age advanced aortic atherosclerosis. Electronically Signed   By: Davina Poke D.O.   On: 07/05/2020 12:09    Pending Labs Unresulted Labs (From admission, onward) Comment          Start     Ordered   07/06/20 0500  Procalcitonin  Daily,   R      07/05/20 1629   07/05/20 1905  MRSA PCR Screening  Once,   STAT        07/05/20 1904   07/05/20 1719  Hemoglobin A1c  Once,   STAT       Comments: To assess prior glycemic control    07/05/20 1718   07/05/20 1617  Lactic acid, plasma  Now then every 2 hours,   STAT      07/05/20 1616   07/05/20 1617  Culture, blood (routine x 2)  BLOOD CULTURE X 2,   STAT      07/05/20 1616   Signed and Held  HIV Antibody (routine testing w rflx)  (HIV Antibody (Routine testing w reflex) panel)  Once,   R        Signed and Held   Signed and Held  CBC  (heparin)  Once,   R       Comments: Baseline for heparin therapy IF  NOT ALREADY DRAWN.  Notify MD if PLT < 100 K.    Signed and Held   Signed and Held  Creatinine, serum  (heparin)  Once,   R       Comments: Baseline for heparin therapy IF NOT ALREADY DRAWN.    Signed and Held   Signed and Held  Comprehensive metabolic panel  Tomorrow morning,   R        Signed and Held   Signed and Held  CBC  Tomorrow morning,   R        Signed and Held          Vitals/Pain Today's Vitals   07/05/20 1137 07/05/20 1141 07/05/20 1516 07/05/20 1518  BP:   113/70   Pulse:   (!) 115   Resp: 20     Temp: 98.1 F (36.7 C)     TempSrc: Oral     SpO2:   91%   Weight:      Height:      PainSc:   10-Worst pain ever  10-Worst pain ever    Isolation Precautions No active isolations  Medications Medications  insulin aspart (novoLOG) injection 0-5 Units (has no administration in time range)  insulin aspart (novoLOG) injection 0-15 Units (has no administration in time range)    Mobility non-ambulatory High fall risk   Focused Assessments    R Recommendations: See Admitting Provider Note  Report given to:   Additional Notes:

## 2020-07-05 NOTE — ED Notes (Signed)
Nurse called from [facility] stating pt tested positive for amphetamine and urine was being sent off for confirmation.

## 2020-07-06 DIAGNOSIS — I10 Essential (primary) hypertension: Secondary | ICD-10-CM | POA: Diagnosis not present

## 2020-07-06 DIAGNOSIS — I5022 Chronic systolic (congestive) heart failure: Secondary | ICD-10-CM

## 2020-07-06 DIAGNOSIS — G9340 Encephalopathy, unspecified: Secondary | ICD-10-CM | POA: Diagnosis not present

## 2020-07-06 DIAGNOSIS — L8990 Pressure ulcer of unspecified site, unspecified stage: Secondary | ICD-10-CM | POA: Insufficient documentation

## 2020-07-06 DIAGNOSIS — K219 Gastro-esophageal reflux disease without esophagitis: Secondary | ICD-10-CM

## 2020-07-06 DIAGNOSIS — D72829 Elevated white blood cell count, unspecified: Secondary | ICD-10-CM | POA: Diagnosis not present

## 2020-07-06 DIAGNOSIS — R000 Tachycardia, unspecified: Secondary | ICD-10-CM

## 2020-07-06 DIAGNOSIS — Z794 Long term (current) use of insulin: Secondary | ICD-10-CM

## 2020-07-06 DIAGNOSIS — L89153 Pressure ulcer of sacral region, stage 3: Secondary | ICD-10-CM

## 2020-07-06 DIAGNOSIS — N184 Chronic kidney disease, stage 4 (severe): Secondary | ICD-10-CM

## 2020-07-06 DIAGNOSIS — E1122 Type 2 diabetes mellitus with diabetic chronic kidney disease: Secondary | ICD-10-CM

## 2020-07-06 LAB — COMPREHENSIVE METABOLIC PANEL
ALT: 21 U/L (ref 0–44)
AST: 30 U/L (ref 15–41)
Albumin: 2.3 g/dL — ABNORMAL LOW (ref 3.5–5.0)
Alkaline Phosphatase: 103 U/L (ref 38–126)
Anion gap: 14 (ref 5–15)
BUN: 16 mg/dL (ref 6–20)
CO2: 28 mmol/L (ref 22–32)
Calcium: 8.6 mg/dL — ABNORMAL LOW (ref 8.9–10.3)
Chloride: 94 mmol/L — ABNORMAL LOW (ref 98–111)
Creatinine, Ser: 0.43 mg/dL — ABNORMAL LOW (ref 0.44–1.00)
GFR calc Af Amer: >60 mL/min (ref >60)
GFR calc non Af Amer: >60 mL/min (ref >60)
Glucose, Bld: 173 mg/dL — ABNORMAL HIGH (ref 70–99)
Potassium: 2.9 mmol/L — ABNORMAL LOW (ref 3.5–5.1)
Sodium: 136 mmol/L (ref 135–145)
Total Bilirubin: 1 mg/dL (ref 0.3–1.2)
Total Protein: 6.8 g/dL (ref 6.5–8.1)

## 2020-07-06 LAB — CBC
HCT: 36.1 % (ref 36.0–46.0)
Hemoglobin: 11.5 g/dL — ABNORMAL LOW (ref 12.0–15.0)
MCH: 30.3 pg (ref 26.0–34.0)
MCHC: 31.9 g/dL (ref 30.0–36.0)
MCV: 95.3 fL (ref 80.0–100.0)
Platelets: 508 10*3/uL — ABNORMAL HIGH (ref 150–400)
RBC: 3.79 MIL/uL — ABNORMAL LOW (ref 3.87–5.11)
RDW: 13.6 % (ref 11.5–15.5)
WBC: 20.1 10*3/uL — ABNORMAL HIGH (ref 4.0–10.5)
nRBC: 0.2 % (ref 0.0–0.2)

## 2020-07-06 LAB — PROCALCITONIN: Procalcitonin: 0.11 ng/mL

## 2020-07-06 LAB — GLUCOSE, CAPILLARY
Glucose-Capillary: 181 mg/dL — ABNORMAL HIGH (ref 70–99)
Glucose-Capillary: 168 mg/dL — ABNORMAL HIGH (ref 70–99)
Glucose-Capillary: 109 mg/dL — ABNORMAL HIGH (ref 70–99)

## 2020-07-06 LAB — MAGNESIUM: Magnesium: 1.6 mg/dL — ABNORMAL LOW (ref 1.7–2.4)

## 2020-07-06 LAB — TSH: TSH: 1.028 u[IU]/mL (ref 0.350–4.500)

## 2020-07-06 LAB — HIV ANTIBODY (ROUTINE TESTING W REFLEX): HIV Screen 4th Generation wRfx: NONREACTIVE

## 2020-07-06 LAB — HEMOGLOBIN A1C
Hgb A1c MFr Bld: 6.4 % — ABNORMAL HIGH (ref 4.8–5.6)
Mean Plasma Glucose: 136.98 mg/dL

## 2020-07-06 MED ORDER — MAGNESIUM SULFATE 2 GM/50ML IV SOLN
2.0000 g | Freq: Once | INTRAVENOUS | Status: AC
  Administered 2020-07-06: 2 g via INTRAVENOUS
  Filled 2020-07-06: qty 50

## 2020-07-06 MED ORDER — METOPROLOL TARTRATE 5 MG/5ML IV SOLN
5.0000 mg | Freq: Once | INTRAVENOUS | Status: AC
  Administered 2020-07-06: 5 mg via INTRAVENOUS
  Filled 2020-07-06: qty 5

## 2020-07-06 MED ORDER — ALUM & MAG HYDROXIDE-SIMETH 200-200-20 MG/5ML PO SUSP
30.0000 mL | ORAL | Status: DC | PRN
  Administered 2020-07-06 (×2): 30 mL via ORAL
  Filled 2020-07-06 (×2): qty 30

## 2020-07-06 MED ORDER — POTASSIUM CHLORIDE CRYS ER 20 MEQ PO TBCR
40.0000 meq | EXTENDED_RELEASE_TABLET | ORAL | Status: AC
  Administered 2020-07-06 (×2): 40 meq via ORAL
  Filled 2020-07-06 (×2): qty 2

## 2020-07-06 MED ORDER — SODIUM CHLORIDE 0.9 % IV BOLUS: 1000.0000 mL | Freq: Once | INTRAVENOUS | Status: DC

## 2020-07-06 MED ORDER — PANTOPRAZOLE SODIUM 40 MG PO TBEC
40.0000 mg | DELAYED_RELEASE_TABLET | Freq: Every day | ORAL | Status: DC
  Administered 2020-07-06: 40 mg via ORAL
  Filled 2020-07-06: qty 1

## 2020-07-06 MED ORDER — METOPROLOL SUCCINATE ER 25 MG PO TB24: 25.0000 mg | ORAL_TABLET | Freq: Two times a day (BID) | ORAL | 1 refills | Status: AC

## 2020-07-06 MED ORDER — FUROSEMIDE 20 MG PO TABS
20.0000 mg | ORAL_TABLET | Freq: Every day | ORAL | 3 refills | Status: DC
Start: 2020-07-08 — End: 2020-10-23

## 2020-07-06 MED ORDER — PANTOPRAZOLE SODIUM 20 MG PO TBEC: 20.0000 mg | DELAYED_RELEASE_TABLET | Freq: Two times a day (BID) | ORAL | Status: AC

## 2020-07-06 NOTE — Care Management (Signed)
Patient discharging back to [facility] as a LTC patient.  Here under observation, less than 24 hours, no FL2 needed. Debbie of [facility] agreeable to return. Will arrange EMS transport.

## 2020-07-06 NOTE — Progress Notes (Signed)
Pt left via EMS in stable condition.

## 2020-07-06 NOTE — Progress Notes (Signed)
Report called to [facility]. Patient resting in bed and awaiting EMS transport.

## 2020-07-06 NOTE — Progress Notes (Signed)
Patient arrived to the floor with a yellow MEWS and had been placed on 2 liters of oxygen before arrival to unit.  Patient vitals taken upon arrival and placed on telemetry.  Pulse rate differs greater from vital sign machine (patient has poor peripheral circulation in finger tips) and actual telemetry box.  Patient is alert and oriented.  Patient has no complaints of chest pain.  Patient was given night time medication in hopes it would decrease heart rate.  Monitored patient and when no change in heart rate notified MD.  Received order for bolus and one time ordered of iv medication to decrease heart rate.  Heart rate has lowered at this time. Patient currently has a green MEWS.

## 2020-07-06 NOTE — Progress Notes (Signed)
[facility] night shift.  The staff reports that the patient continues to be tachycardic.  She was initially hypotensive upon arrival to the emergency department.  She was subsequently admitted due to altered mental status.  She received 25 mg of metoprolol tartrate and 0.125 mg of digoxin before bedtime.  Heart rate is still in the 120s and 130s.  Her most recent blood pressure is 136/62 mmHg.  Since the patient was out there earlier and takes hydrochlorothiazide, she is still likely volume depleted.  I will try a 1000 mL NS bolus along with 5 mg of metoprolol IVP.  Will continue to monitor with cardiac telemetry.  Tennis Must, MD.

## 2020-07-06 NOTE — Plan of Care (Signed)

## 2020-07-06 NOTE — Discharge Summary (Signed)
Physician Discharge Summary  [name] OXB:[id number] DOB: [birth year] DOA: 07/05/2020  PCP: Caprice Renshaw, MD  Admit date: 07/05/2020 Discharge date: 07/06/2020  Time spent: 35 minutes  Recommendations for Outpatient Follow-up:  1. Repeat CBC to follow WBCs trend 2. Repeat basic metabolic panel and magnesium to follow electrolytes and renal function   Discharge Diagnoses:  Active Problems:   Type 2 diabetes mellitus (HCC)   HTN (hypertension)   Hx of ischemic right MCA stroke, 1998   COPD (chronic obstructive pulmonary disease) (HCC)   GERD (gastroesophageal reflux disease)   Chronic systolic congestive heart failure (HCC)   Cardiomyopathy, ischemic   Encephalopathy acute   Pressure injury of skin   Discharge Condition: Stable and improved.  Discharge back to skilled nursing facility for further care and rehabilitation.  CODE STATUS: Full code.  Diet recommendation: Heart healthy and modified carbohydrate diet.  Filed Weights   07/05/20 1009 07/05/20 2121  Weight: 60.3 kg 50.4 kg    History of present illness:  As per H&P written by Dr. Waldron Labs on 07/05/2020 [name]  is a 54 y.o. 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, type 2 diabetes mellitus, hypertension, anxiety, depression, history of CVA with residual left-sided weakness, patient was brought to ED from SNF [facility]) episode of altered mental status, where she was staring off into space, patient denies any complaints, reports she was feeling fine, there was no seizure activity, no urinary or stool incontinence (patient with urine incontinence at baseline), as well patient complains of chronic lower back pain, she is with known sacral pressure ulcer, but appears to be at baseline, actually improving, with no discharge or foul smelling odor, she denies fever, she denies chills, she denies cough, and apparently urine drug screen and at the facility showing possible amphetamine) that was negative  here), so she was sent for verification, patient herself denies any substance abuse, denies taking any medications other than her scheduled meds per facility. - in ED patient mentation at baseline, there was no acute neurological findings, CT head with no acute finding as well, labs were only significant for leukocytosis at 24K, but dirty UA, and chest x-ray with no evidence of infectious etiology, blood cultures were pending, procalcitonin low at 0.18, her valproic acid level low at 24, but she is on it for pain and insomnia, and not seizures, she denies any history of seizures, given leukocytosis ED requested asked to admit for observation overnight.  Hospital Course:  1-acute transient encephalopathy -Patient reports every morning feeling that way but denies any further complaints. -Work-up negative for infections and no seizure appreciated. -Patient remains nontoxic and overall improvement in her condition after fluid resuscitation and supportive care. -Safe to discharge back to skilled nursing facility for further care and rehab.  2-leukocytosis -stress demargination and hemoconcentration from dehydration  -improved with IVF's -no source of infections appreciated.  3-Hx of CVA/CAD/PAD -no CP, no complaints of SOB -continue aspirin, Plavix and statins.  4-essential hypertension -Resume home medications except for HCTZ -At this moment patient bullion is a stable and if fluid control needed down the road will recommend low-dose of Lasix.  5-dehydration/Hypokalemia -In the setting of diuretics usage -IVF's given -overall improved -recommend low dose lasix for volume management in near future -continue to follow intake and output and ensure adequate hydration.  6-chronic systolic heart failure -Compensated -Continue beta-blocker, low-sodium diet and low-dose Lasix as an outpatient. -Continue follow-up with cardiology service as an outpatient.  7-type 2  diabetes with  nephropathy -Patient creatinine level is low in the setting of low muscular mass after bilateral AKA -GFR in the 20's range -appears to be at baseline -continue to monitor Cr as an outpatient  -resume home hypoglycemic regimen   8-history of COPD -No wheezing and no shortness of breath -Continue as needed bronchodilators.  9-stage III sacral pressure injury -Continue prior to admission wound care and preventive measurements. -No signs of superimposed infection. -Referred to epic media section for pictures referral.  10-gastroesophageal disease -Continue PPI.  11-depression/anxiety -Resume home antidepressant and anxiolytic regimen -Stable mood currently -No suicidal ideation or hallucination.    Procedures: See below for x-ray reports.  Consultations:  None  Discharge Exam: Vitals:   07/06/20 0400 07/06/20 1210  BP: (!) 137/59   Pulse: (!) 104   Resp: 16   Temp: 98.4 F (36.9 C)   SpO2: 96% 95%    General: In no acute distress, no nausea, no vomiting.  Reports feeling at baseline.  Patient is afebrile. Cardiovascular: Positive systolic murmur, no rubs, no gallops, no JVD on exam. Respiratory: Good air movement bilaterally; no using accessory muscles.  Normal respiratory effort. Abdomen: Soft, nontender, distended, positive bowel sounds. Extremities: Bilateral AKA, no cyanosis or clubbing Skin: stage 3 sacrum ulcer, POA and without signs of superimposed infection.  Discharge Instructions   Discharge Instructions    Diet - low sodium heart healthy   Complete by: As directed    Discharge instructions   Complete by: As directed    Take medications as prescribed Maintain adequate hydration Continue wound care as previously dictated Repeat basic metabolic panel and CBC in 1 week to follow electrolytes check stability and WBC's trend.   Discharge wound care:   Complete by: As directed    Resume prior to admission wound care instructions and follow up.    Increase activity slowly   Complete by: As directed      Allergies as of 07/06/2020      Reactions   Ace Inhibitors    Flexeril [cyclobenzaprine]    Morphine Other (See Comments)   PO form causes reaction Other reaction(s): SHORTNESS OF BREATH PO form causes reaction Other reaction(s): SHORTNESS OF BREATH   Paxil [paroxetine Hcl]    Silver Sulfadiazine Dermatitis      Medication List    STOP taking these medications   azithromycin 250 MG tablet Commonly known as: ZITHROMAX   cefUROXime 500 MG tablet Commonly known as: CEFTIN   fentaNYL 50 MCG/HR Commonly known as: DURAGESIC   gabapentin 800 MG tablet Commonly known as: NEURONTIN   metFORMIN 500 MG tablet Commonly known as: GLUCOPHAGE   Metoprolol-Hydrochlorothiazide 25-12.5 MG Tb24     TAKE these medications   acetaminophen 500 MG tablet Commonly known as: TYLENOL Take 1,000 mg by mouth 2 (two) times daily.   ALPRAZolam 0.5 MG tablet Commonly known as: XANAX Take 0.5 mg by mouth 2 (two) times daily.   ARIPiprazole 5 MG tablet Commonly known as: ABILIFY Take 5 mg by mouth daily.   aspirin EC 81 MG tablet Take 81 mg by mouth daily. Swallow whole.   atorvastatin 80 MG tablet Commonly known as: LIPITOR Take 80 mg by mouth daily.   Baclofen 5 MG Tabs Take 1 tablet by mouth 3 (three) times daily.   calcium carbonate 500 MG chewable tablet Commonly known as: TUMS - dosed in mg elemental calcium Chew 2 tablets by mouth at bedtime.   clopidogrel 75 MG tablet Commonly known as:  PLAVIX Take 75 mg by mouth daily.   Cranberry 450 MG Caps Take 2 tablets by mouth 2 (two) times daily.   digoxin 0.25 MG tablet Commonly known as: LANOXIN Take 250 mcg by mouth daily.   divalproex 125 MG DR tablet Commonly known as: DEPAKOTE Take 250 mg by mouth 2 (two) times daily.   DULoxetine 60 MG capsule Commonly known as: CYMBALTA Take 60 mg by mouth 2 (two) times daily.   Ensure Take 237 mLs by mouth 2 (two) times  daily between meals.   fenofibrate 160 MG tablet Take 160 mg by mouth daily.   FLUoxetine 20 MG tablet Commonly known as: PROZAC Take 20 mg by mouth daily.   furosemide 20 MG tablet Commonly known as: Lasix Take 1 tablet (20 mg total) by mouth daily. Start taking on: July 08, 2020   insulin glargine 100 UNIT/ML injection Commonly known as: LANTUS Inject 40 Units into the skin daily.   insulin lispro 100 UNIT/ML injection Commonly known as: HUMALOG Inject 2-15 Units into the skin in the morning, at noon, in the evening, and at bedtime. Before meals and at bedtime. Per sliding scale   lidocaine 5 % Commonly known as: LIDODERM Place 3 patches onto the skin daily. Remove & Discard patch within 12 hours to lower back and each knee stump. (1 patch for each body part)   melatonin 3 MG Tabs tablet Take 3 mg by mouth at bedtime.   methenamine 1 g tablet Commonly known as: HIPREX Take 1 g by mouth daily.   metoprolol succinate 25 MG 24 hr tablet Commonly known as: TOPROL-XL Take 1 tablet (25 mg total) by mouth 2 (two) times daily.   multivitamin tablet Take 1 tablet by mouth daily.   nitroGLYCERIN 0.4 MG SL tablet Commonly known as: NITROSTAT Place 0.4 mg under the tongue every 5 (five) minutes as needed for chest pain.   ondansetron 4 MG tablet Commonly known as: ZOFRAN Take 1 tablet (4 mg total) by mouth every 8 (eight) hours as needed for nausea or vomiting.   Ozempic (1 MG/DOSE) 2 MG/1.5ML Sopn Generic drug: Semaglutide (1 MG/DOSE) Inject 1 mg into the skin once a week. On fridays   pantoprazole 20 MG tablet Commonly known as: PROTONIX Take 1 tablet (20 mg total) by mouth 2 (two) times daily. What changed: when to take this   polyethylene glycol 17 g packet Commonly known as: MIRALAX / GLYCOLAX Take 17 g by mouth daily as needed.   Potassium Chloride ER 20 MEQ Tbcr Take 2 tablets by mouth daily.   pregabalin 75 MG capsule Commonly known as: LYRICA Take 75  mg by mouth 3 (three) times daily.   QUEtiapine 25 MG tablet Commonly known as: SEROQUEL Take 25 mg by mouth 2 (two) times daily.   traMADol 50 MG tablet Commonly known as: ULTRAM Take 100 mg by mouth every 8 (eight) hours.   vitamin B-12 500 MCG tablet Commonly known as: CYANOCOBALAMIN Take 1,000 mcg by mouth daily.            Discharge Care Instructions  (From admission, onward)         Start     Ordered   07/06/20 0000  Discharge wound care:       Comments: Resume prior to admission wound care instructions and follow up.   07/06/20 1347         Allergies  Allergen Reactions  . Ace Inhibitors   . Flexeril [Cyclobenzaprine]   .  Morphine Other (See Comments)    PO form causes reaction Other reaction(s): SHORTNESS OF BREATH PO form causes reaction Other reaction(s): SHORTNESS OF BREATH   . Paxil [Paroxetine Hcl]   . Silver Sulfadiazine Dermatitis    Follow-up Information    Caprice Renshaw, MD. Schedule an appointment as soon as possible for a visit in 2 week(s).   Specialty: Internal Medicine Contact information: [address] [place] [address] [phone]               The results of significant diagnostics from this hospitalization (including imaging, microbiology, ancillary and laboratory) are listed below for reference.    Significant Diagnostic Studies: DG Chest Portable 1 View  Result Date: 07/05/2020 CLINICAL DATA:  Back pain.  Fall from bed EXAM: PORTABLE CHEST 1 VIEW COMPARISON:  06/01/2020 FINDINGS: The heart size and mediastinal contours are within normal limits. Age advanced aortic atherosclerosis. Linear opacities within the periphery of the left mid lung and left lung base favor atelectasis. Chronic coarsened interstitial markings bilaterally. No pleural effusion or pneumothorax identified. The visualized skeletal structures are unremarkable. IMPRESSION: 1. Linear opacities within the periphery of the left mid lung and left lung base, favor  atelectasis. Otherwise no acute cardiopulmonary findings. 2. Age advanced aortic atherosclerosis. Electronically Signed   By: Davina Poke D.O.   On: 07/05/2020 12:09    Microbiology: Recent Results (from the past 240 hour(s))  Culture, blood (routine x 2)     Status: None (Preliminary result)   Collection Time: 07/05/20  4:22 PM   Specimen: BLOOD RIGHT WRIST  Result Value Ref Range Status   Specimen Description BLOOD RIGHT WRIST  Final   Special Requests   Final    BOTTLES DRAWN AEROBIC AND ANAEROBIC Blood Culture adequate volume   Culture   Final    NO GROWTH < 24 HOURS Performed at [facility], [address] [place], [place] [address]    Report Status PENDING  Incomplete  Culture, blood (routine x 2)     Status: None (Preliminary result)   Collection Time: 07/05/20  4:29 PM   Specimen: Right Antecubital; Blood  Result Value Ref Range Status   Specimen Description RIGHT ANTECUBITAL  Final   Special Requests   Final    BOTTLES DRAWN AEROBIC ONLY Blood Culture adequate volume   Culture   Final    NO GROWTH < 24 HOURS Performed at [facility], [address] [place] [address], [place], [place] [address]    Report Status PENDING  Incomplete  MRSA PCR Screening     Status: None   Collection Time: 07/05/20  7:05 PM   Specimen: Nasopharyngeal  Result Value Ref Range Status   MRSA by PCR NEGATIVE NEGATIVE Final    Comment:        The GeneXpert MRSA Assay (FDA approved for NASAL specimens only), is one component of a comprehensive MRSA colonization surveillance program. It is not intended to diagnose MRSA infection nor to guide or monitor treatment for MRSA infections. Performed at [facility], [address] [place], [place] [address]   SARS Coronavirus 2 by RT PCR (hospital order, performed in [facility] hospital lab) Nasopharyngeal Nasopharyngeal Swab     Status: None   Collection Time: 07/05/20  7:09 PM   Specimen: Nasopharyngeal Swab  Result Value Ref Range Status   SARS  Coronavirus 2 NEGATIVE NEGATIVE Final    Comment: (NOTE) SARS-CoV-2 target nucleic acids are NOT DETECTED.  The SARS-CoV-2 RNA is generally detectable in upper and  lower respiratory specimens during the acute phase of infection. The lowest concentration of SARS-CoV-2 viral copies this assay can detect is 250 copies / mL. A negative result does not preclude SARS-CoV-2 infection and should not be used as the sole basis for treatment or other patient management decisions.  A negative result may occur with improper specimen collection / handling, submission of specimen other than nasopharyngeal swab, presence of viral mutation(s) within the areas targeted by this assay, and inadequate number of viral copies (<250 copies / mL). A negative result must be combined with clinical observations, patient history, and epidemiological information.  Fact Sheet for Patients:   StrictlyIdeas.no  Fact Sheet for Healthcare Providers: BankingDealers.co.za  This test is not yet approved or  cleared by the [place] FDA and has been authorized for detection and/or diagnosis of SARS-CoV-2 by FDA under an Emergency Use Authorization (EUA).  This EUA will remain in effect (meaning this test can be used) for the duration of the COVID-19 declaration under Section 564(b)(1) of the Act, 21 U.S.C. section 360bbb-3(b)(1), unless the authorization is terminated or revoked sooner.  Performed at [facility], [address], [place], [place] [address]   MRSA PCR Screening     Status: None   Collection Time: 07/05/20  9:35 PM   Specimen: Nasal Mucosa; Nasopharyngeal  Result Value Ref Range Status   MRSA by PCR NEGATIVE NEGATIVE Final    Comment:        The GeneXpert MRSA Assay (FDA approved for NASAL specimens only), is one component of a comprehensive MRSA colonization surveillance program. It is not intended to diagnose MRSA infection nor to guide  or monitor treatment for MRSA infections. Performed at [facility], [address] [place], [place] [address]      Labs: Basic Metabolic Panel: Recent Labs  Lab 07/05/20 1051 07/06/20 0435  NA 134* 136  K 4.0 2.9*  CL 96* 94*  CO2 26 28  GLUCOSE 129* 173*  BUN 11 16  CREATININE 0.48 0.43*  CALCIUM 8.9 8.6*  MG  --  1.6*   Liver Function Tests: Recent Labs  Lab 07/05/20 1051 07/06/20 0435  AST 35 30  ALT 22 21  ALKPHOS 101 103  BILITOT 0.6 1.0  PROT 6.9 6.8  ALBUMIN 2.4* 2.3*   No results for input(s): LIPASE, AMYLASE in the last 168 hours. Recent Labs  Lab 07/05/20 1051  AMMONIA 24   CBC: Recent Labs  Lab 07/05/20 1051 07/06/20 0435  WBC 24.0* 20.1*  NEUTROABS 18.6*  --   HGB 12.1 11.5*  HCT 39.7 36.1  MCV 98.0 95.3  PLT 435* 508*   CBG: Recent Labs  Lab 07/05/20 2128 07/06/20 0719 07/06/20 1108  GLUCAP 63* 181* 168*       Signed:  Barton Dubois MD.  [facility] 07/06/2020, 1:54 PM

## 2020-07-10 LAB — CULTURE, BLOOD (ROUTINE X 2)
Culture: NO GROWTH
Culture: NO GROWTH
Special Requests: ADEQUATE
Special Requests: ADEQUATE

## 2020-10-20 ENCOUNTER — Inpatient Hospital Stay (HOSPITAL_COMMUNITY)
Admission: EM | Admit: 2020-10-20 | Discharge: 2020-10-23 | DRG: 593 | Disposition: A | Discharge status: Skilled Nursing Facility | Payer: Medicare (Managed Care) | Source: Skilled Nursing Facility | Attending: Internal Medicine | Admitting: Internal Medicine

## 2020-10-20 ENCOUNTER — Other Ambulatory Visit: Payer: Self-pay

## 2020-10-20 ENCOUNTER — Emergency Department (HOSPITAL_COMMUNITY): Payer: Medicare (Managed Care)

## 2020-10-20 ENCOUNTER — Encounter (HOSPITAL_COMMUNITY): Payer: Self-pay

## 2020-10-20 DIAGNOSIS — Z66 Do not resuscitate: Secondary | ICD-10-CM | POA: Diagnosis present

## 2020-10-20 DIAGNOSIS — L89154 Pressure ulcer of sacral region, stage 4: Secondary | ICD-10-CM

## 2020-10-20 DIAGNOSIS — E1169 Type 2 diabetes mellitus with other specified complication: Secondary | ICD-10-CM | POA: Diagnosis not present

## 2020-10-20 DIAGNOSIS — E8809 Other disorders of plasma-protein metabolism, not elsewhere classified: Secondary | ICD-10-CM

## 2020-10-20 DIAGNOSIS — Z7902 Long term (current) use of antithrombotics/antiplatelets: Secondary | ICD-10-CM

## 2020-10-20 DIAGNOSIS — R29810 Facial weakness: Secondary | ICD-10-CM | POA: Diagnosis present

## 2020-10-20 DIAGNOSIS — F419 Anxiety disorder, unspecified: Secondary | ICD-10-CM | POA: Diagnosis present

## 2020-10-20 DIAGNOSIS — Z7982 Long term (current) use of aspirin: Secondary | ICD-10-CM

## 2020-10-20 DIAGNOSIS — Z89512 Acquired absence of left leg below knee: Secondary | ICD-10-CM

## 2020-10-20 DIAGNOSIS — Z882 Allergy status to sulfonamides status: Secondary | ICD-10-CM

## 2020-10-20 DIAGNOSIS — Z794 Long term (current) use of insulin: Secondary | ICD-10-CM

## 2020-10-20 DIAGNOSIS — Z20822 Contact with and (suspected) exposure to covid-19: Secondary | ICD-10-CM | POA: Diagnosis present

## 2020-10-20 DIAGNOSIS — I252 Old myocardial infarction: Secondary | ICD-10-CM | POA: Diagnosis not present

## 2020-10-20 DIAGNOSIS — Z6841 Body Mass Index (BMI) 40.0 and over, adult: Secondary | ICD-10-CM

## 2020-10-20 DIAGNOSIS — Z87891 Personal history of nicotine dependence: Secondary | ICD-10-CM | POA: Diagnosis not present

## 2020-10-20 DIAGNOSIS — J449 Chronic obstructive pulmonary disease, unspecified: Secondary | ICD-10-CM | POA: Diagnosis present

## 2020-10-20 DIAGNOSIS — K219 Gastro-esophageal reflux disease without esophagitis: Secondary | ICD-10-CM

## 2020-10-20 DIAGNOSIS — E1151 Type 2 diabetes mellitus with diabetic peripheral angiopathy without gangrene: Secondary | ICD-10-CM | POA: Diagnosis present

## 2020-10-20 DIAGNOSIS — Z8673 Personal history of transient ischemic attack (TIA), and cerebral infarction without residual deficits: Secondary | ICD-10-CM

## 2020-10-20 DIAGNOSIS — E669 Obesity, unspecified: Secondary | ICD-10-CM | POA: Diagnosis present

## 2020-10-20 DIAGNOSIS — E872 Acidosis, unspecified: Secondary | ICD-10-CM

## 2020-10-20 DIAGNOSIS — I959 Hypotension, unspecified: Secondary | ICD-10-CM

## 2020-10-20 DIAGNOSIS — Z885 Allergy status to narcotic agent status: Secondary | ICD-10-CM

## 2020-10-20 DIAGNOSIS — Z89611 Acquired absence of right leg above knee: Secondary | ICD-10-CM

## 2020-10-20 DIAGNOSIS — L89150 Pressure ulcer of sacral region, unstageable: Principal | ICD-10-CM | POA: Diagnosis present

## 2020-10-20 DIAGNOSIS — L89159 Pressure ulcer of sacral region, unspecified stage: Secondary | ICD-10-CM

## 2020-10-20 DIAGNOSIS — A419 Sepsis, unspecified organism: Secondary | ICD-10-CM

## 2020-10-20 DIAGNOSIS — E11649 Type 2 diabetes mellitus with hypoglycemia without coma: Secondary | ICD-10-CM | POA: Diagnosis present

## 2020-10-20 DIAGNOSIS — I110 Hypertensive heart disease with heart failure: Secondary | ICD-10-CM | POA: Diagnosis present

## 2020-10-20 DIAGNOSIS — E1142 Type 2 diabetes mellitus with diabetic polyneuropathy: Secondary | ICD-10-CM | POA: Diagnosis present

## 2020-10-20 DIAGNOSIS — Z23 Encounter for immunization: Secondary | ICD-10-CM

## 2020-10-20 DIAGNOSIS — I10 Essential (primary) hypertension: Secondary | ICD-10-CM | POA: Diagnosis present

## 2020-10-20 DIAGNOSIS — I69354 Hemiplegia and hemiparesis following cerebral infarction affecting left non-dominant side: Secondary | ICD-10-CM

## 2020-10-20 DIAGNOSIS — Z89511 Acquired absence of right leg below knee: Secondary | ICD-10-CM

## 2020-10-20 DIAGNOSIS — E162 Hypoglycemia, unspecified: Secondary | ICD-10-CM | POA: Diagnosis not present

## 2020-10-20 DIAGNOSIS — E785 Hyperlipidemia, unspecified: Secondary | ICD-10-CM

## 2020-10-20 DIAGNOSIS — Z89612 Acquired absence of left leg above knee: Secondary | ICD-10-CM

## 2020-10-20 DIAGNOSIS — I5022 Chronic systolic (congestive) heart failure: Secondary | ICD-10-CM | POA: Diagnosis present

## 2020-10-20 DIAGNOSIS — Z79899 Other long term (current) drug therapy: Secondary | ICD-10-CM

## 2020-10-20 DIAGNOSIS — R772 Abnormality of alphafetoprotein: Secondary | ICD-10-CM | POA: Diagnosis not present

## 2020-10-20 DIAGNOSIS — Z888 Allergy status to other drugs, medicaments and biological substances status: Secondary | ICD-10-CM

## 2020-10-20 DIAGNOSIS — Z993 Dependence on wheelchair: Secondary | ICD-10-CM

## 2020-10-20 LAB — CBC WITH DIFFERENTIAL/PLATELET
Abs Immature Granulocytes: 0.04 10*3/uL (ref 0.00–0.07)
Basophils Absolute: 0 10*3/uL (ref 0.0–0.1)
Basophils Relative: 0 %
Eosinophils Absolute: 0 10*3/uL (ref 0.0–0.5)
Eosinophils Relative: 0 %
HCT: 30.8 % — ABNORMAL LOW (ref 36.0–46.0)
Hemoglobin: 9.7 g/dL — ABNORMAL LOW (ref 12.0–15.0)
Immature Granulocytes: 0 %
Lymphocytes Relative: 24 %
Lymphs Abs: 2.5 10*3/uL (ref 0.7–4.0)
MCH: 29.7 pg (ref 26.0–34.0)
MCHC: 31.5 g/dL (ref 30.0–36.0)
MCV: 94.2 fL (ref 80.0–100.0)
Monocytes Absolute: 0.6 10*3/uL (ref 0.1–1.0)
Monocytes Relative: 6 %
Neutro Abs: 7.1 10*3/uL (ref 1.7–7.7)
Neutrophils Relative %: 70 %
Platelets: 365 10*3/uL (ref 150–400)
RBC: 3.27 MIL/uL — ABNORMAL LOW (ref 3.87–5.11)
RDW: 15.4 % (ref 11.5–15.5)
WBC: 10.3 10*3/uL (ref 4.0–10.5)
nRBC: 0 % (ref 0.0–0.2)

## 2020-10-20 LAB — COMPREHENSIVE METABOLIC PANEL
ALT: 21 U/L (ref 0–44)
AST: 32 U/L (ref 15–41)
Albumin: 2 g/dL — ABNORMAL LOW (ref 3.5–5.0)
Alkaline Phosphatase: 195 U/L — ABNORMAL HIGH (ref 38–126)
Anion gap: 8 (ref 5–15)
BUN: 15 mg/dL (ref 6–20)
CO2: 25 mmol/L (ref 22–32)
Calcium: 7.7 mg/dL — ABNORMAL LOW (ref 8.9–10.3)
Chloride: 107 mmol/L (ref 98–111)
Creatinine, Ser: 0.3 mg/dL — ABNORMAL LOW (ref 0.44–1.00)
GFR, Estimated: >60 mL/min (ref >60)
Glucose, Bld: 60 mg/dL — ABNORMAL LOW (ref 70–99)
Potassium: 3.5 mmol/L (ref 3.5–5.1)
Sodium: 140 mmol/L (ref 135–145)
Total Bilirubin: 0.4 mg/dL (ref 0.3–1.2)
Total Protein: 5.9 g/dL — ABNORMAL LOW (ref 6.5–8.1)

## 2020-10-20 LAB — PROTIME-INR
INR: 1.2 (ref 0.8–1.2)
Prothrombin Time: 14.7 seconds (ref 11.4–15.2)

## 2020-10-20 LAB — RESPIRATORY PANEL BY RT PCR (FLU A&B, COVID)
Influenza A by PCR: NEGATIVE
Influenza B by PCR: NEGATIVE
SARS Coronavirus 2 by RT PCR: NEGATIVE

## 2020-10-20 LAB — DIGOXIN LEVEL: Digoxin Level: 0.6 ng/mL — ABNORMAL LOW (ref 1.0–2.0)

## 2020-10-20 LAB — CBG MONITORING, ED
Glucose-Capillary: 39 mg/dL — CL (ref 70–99)
Glucose-Capillary: 156 mg/dL — ABNORMAL HIGH (ref 70–99)

## 2020-10-20 LAB — APTT: aPTT: 47 seconds — ABNORMAL HIGH (ref 24–36)

## 2020-10-20 LAB — LACTIC ACID, PLASMA
Lactic Acid, Venous: 2.4 mmol/L (ref 0.5–1.9)
Lactic Acid, Venous: 1.7 mmol/L (ref 0.5–1.9)

## 2020-10-20 LAB — VALPROIC ACID LEVEL: Valproic Acid Lvl: 20 ug/mL — ABNORMAL LOW (ref 50.0–100.0)

## 2020-10-20 MED ORDER — LACTATED RINGERS IV BOLUS (SEPSIS)
1000.0000 mL | Freq: Once | INTRAVENOUS | Status: AC
  Administered 2020-10-20: 1000 mL via INTRAVENOUS

## 2020-10-20 MED ORDER — DEXTROSE 50 % IV SOLN: 1.0000 | Freq: Once | INTRAVENOUS | Status: AC

## 2020-10-20 MED ORDER — SODIUM CHLORIDE 0.9 % IV SOLN: 2.0000 g | INTRAVENOUS | Status: DC

## 2020-10-20 MED ORDER — PANTOPRAZOLE SODIUM 40 MG PO TBEC
40.0000 mg | DELAYED_RELEASE_TABLET | Freq: Every day | ORAL | Status: DC
  Administered 2020-10-21 – 2020-10-23 (×3): 40 mg via ORAL
  Filled 2020-10-20 (×3): qty 1

## 2020-10-20 MED ORDER — VANCOMYCIN HCL 500 MG/100ML IV SOLN
500.0000 mg | INTRAVENOUS | Status: DC
  Filled 2020-10-20: qty 100

## 2020-10-20 MED ORDER — CLOPIDOGREL BISULFATE 75 MG PO TABS
75.0000 mg | ORAL_TABLET | Freq: Every day | ORAL | Status: DC
  Administered 2020-10-21: 75 mg via ORAL
  Filled 2020-10-20: qty 1

## 2020-10-20 MED ORDER — SODIUM CHLORIDE 0.9 % IV SOLN
2.0000 g | INTRAVENOUS | Status: DC
  Administered 2020-10-20: 2 g via INTRAVENOUS
  Filled 2020-10-20: qty 2

## 2020-10-20 MED ORDER — INSULIN ASPART 100 UNIT/ML ~~LOC~~ SOLN
0.0000 [IU] | Freq: Three times a day (TID) | SUBCUTANEOUS | Status: DC
  Administered 2020-10-21: 3 [IU] via SUBCUTANEOUS
  Administered 2020-10-22 (×2): 1 [IU] via SUBCUTANEOUS
  Administered 2020-10-22: 2 [IU] via SUBCUTANEOUS
  Administered 2020-10-23: 1 [IU] via SUBCUTANEOUS
  Administered 2020-10-23: 3 [IU] via SUBCUTANEOUS
  Filled 2020-10-20: qty 1

## 2020-10-20 MED ORDER — ASPIRIN EC 81 MG PO TBEC
81.0000 mg | DELAYED_RELEASE_TABLET | Freq: Every day | ORAL | Status: DC
  Administered 2020-10-21: 81 mg via ORAL
  Filled 2020-10-20: qty 1

## 2020-10-20 MED ORDER — DEXTROSE-NACL 5-0.9 % IV SOLN: INTRAVENOUS | Status: DC

## 2020-10-20 MED ORDER — VANCOMYCIN HCL IN DEXTROSE 1-5 GM/200ML-% IV SOLN
1000.0000 mg | Freq: Once | INTRAVENOUS | Status: AC
  Administered 2020-10-20: 1000 mg via INTRAVENOUS
  Filled 2020-10-20: qty 200

## 2020-10-20 MED ORDER — ENOXAPARIN SODIUM 40 MG/0.4ML ~~LOC~~ SOLN: 40.0000 mg | SUBCUTANEOUS | Status: DC

## 2020-10-20 MED ORDER — SODIUM CHLORIDE 0.9 % IV SOLN
2.0000 g | Freq: Once | INTRAVENOUS | Status: AC
  Administered 2020-10-20: 2 g via INTRAVENOUS
  Filled 2020-10-20: qty 20

## 2020-10-20 MED ORDER — ATORVASTATIN CALCIUM 40 MG PO TABS
80.0000 mg | ORAL_TABLET | Freq: Every day | ORAL | Status: DC
  Administered 2020-10-21 – 2020-10-22 (×2): 80 mg via ORAL
  Filled 2020-10-20 (×3): qty 2

## 2020-10-20 MED ORDER — INSULIN ASPART 100 UNIT/ML ~~LOC~~ SOLN: 0.0000 [IU] | Freq: Every day | SUBCUTANEOUS | Status: DC

## 2020-10-20 MED ORDER — DEXTROSE 50 % IV SOLN
INTRAVENOUS | Status: AC
  Administered 2020-10-20: 50 mL via INTRAVENOUS
  Filled 2020-10-20: qty 50

## 2020-10-20 MED ORDER — VANCOMYCIN HCL IN DEXTROSE 1-5 GM/200ML-% IV SOLN
1000.0000 mg | INTRAVENOUS | Status: DC
  Administered 2020-10-21: 1000 mg via INTRAVENOUS
  Filled 2020-10-20: qty 200

## 2020-10-20 MED ORDER — LACTATED RINGERS IV BOLUS (SEPSIS)
500.0000 mL | Freq: Once | INTRAVENOUS | Status: AC
  Administered 2020-10-20: 500 mL via INTRAVENOUS

## 2020-10-20 NOTE — ED Notes (Signed)
CRITICAL VALUE ALERT  Critical Value: Lactic Acid 2.4 Date & Time Notied:  10/20/20 @ 1923 Provider Notified:Dr Sabra Heck Orders Received/Actions taken: None yet

## 2020-10-20 NOTE — H&P (Signed)
History and Physical  Allen Basista WUJ:[id number] DOB: [birth year] DOA: 10/20/2020  Referring physician: Noemi Chapel, MD PCP: Caprice Renshaw, MD  Patient coming from: [facility]  Chief Complaint: Wound Infection  HPI: [name] is a 54 y.o. 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 Daughter was at bedside and provided most of the history. Patient states that she has had decreased appetite and felt weak and has been more bedbound within last couple of weeks (usually able to self transfer from bed to wheelchair at baseline), she has history of sacral decubitus several years ago and has had struggle with healing correctly of this wound, recently she was noted to have an infection of the wound, PICC line was placed at the nursing facility and patient was started on IV antibiotic. Daughter states that she went to see her mom (patient) over the last couple of days and was concerned that she was not getting adequate care for infection, so she requested for patient to be transferred to the ED for further evaluation. On arrival of EMS, daughter states that patient's SBP was noted to be in the 70s and IV 500 cc bolus was given with no significant improvement in blood pressure.  She denies fever, chills, shortness of breath, chest pain, shortness of breath, nausea, vomiting or abdominal pain. Last bowel movement was yesterday.  ED Course:  In the emergency department, she was hypotensive with a BP of 79/54, but other vital signs were within normal range. Work-up in the ED showed normocytic anemia, hypoglycemia, lactic acid of 2.4, hypoalbuminemia, ALP 195. RSV for influenza A and B as well as SARS coronavirus 2 were negative. Chest x-ray  showed no active disease. She was started on vancomycin and ceftriaxone. Hospitalist was asked to admit patient for further evaluation and management.  Review of Systems: Constitutional: Negative for chills and fever.  HENT: Negative for ear pain and sore throat.   Eyes: Negative for pain and visual disturbance.  Respiratory: Negative for cough, chest tightness and shortness of breath.   Cardiovascular: Negative for chest pain and palpitations.  Gastrointestinal: Positive for decreased appetite. Negative for abdominal pain and vomiting.  Endocrine: Negative for polyphagia and polyuria.  Genitourinary: Negative for decreased urine volume, dysuria Musculoskeletal: Negative for arthralgias and back pain.  Skin: Negative for color change and rash.  Allergic/Immunologic: Negative for immunocompromised state.  Neurological: Negative for tremors, syncope, speech difficulty, weakness, light-headedness and headaches.  Hematological: Does not bruise/bleed easily.  All other systems reviewed and are negative    Past Medical History:  Diagnosis Date  . Anemia   . Anxiety   . Asthma   . Cigarette smoker 02/25/2014  . COPD (chronic obstructive pulmonary disease) ([place])   . Diabetes mellitus without complication ([place])   . GERD (gastroesophageal reflux disease)   . Hx of ischemic right MCA stroke 02/25/2014  . Hypertension   . MI (myocardial infarction) ([place])   . Obesity, unspecified 02/25/2014  . Stroke [facility])    Past Surgical History:  Procedure Laterality Date  . ABOVE KNEE LEG AMPUTATION Bilateral   . ANGIOPLASTY      Social History:  reports that she has quit smoking. Her smoking use included cigarettes. She smoked 0.50 packs per day. She has never used smokeless tobacco. She reports  that she does not drink alcohol and does not use drugs.   Allergies  Allergen Reactions  . Morphine Other (See Comments)    PO form causes reaction Other reaction(s): SHORTNESS OF BREATH PO form causes  reaction Other reaction(s): SHORTNESS OF BREATH   . Ace Inhibitors   . Flexeril [Cyclobenzaprine]   . Paxil [Paroxetine Hcl]   . Silver Sulfadiazine Dermatitis    Family History  Problem Relation Age of Onset  . Vascular Disease Other     Prior to Admission medications   Medication Sig Start Date End Date Taking? Authorizing Provider  acetaminophen (TYLENOL) 500 MG tablet Take 1,000 mg by mouth 2 (two) times daily.    [provider]  ALPRAZolam Duanne Moron) 0.5 MG tablet Take 0.5 mg by mouth 2 (two) times daily.     [provider]  ARIPiprazole (ABILIFY) 5 MG tablet Take 5 mg by mouth daily. 12/13/19   [provider]  aspirin EC 81 MG tablet Take 81 mg by mouth daily. Swallow whole.    [provider]  atorvastatin (LIPITOR) 80 MG tablet Take 80 mg by mouth daily.    [provider]  Baclofen 5 MG TABS Take 1 tablet by mouth 3 (three) times daily.     [provider]  calcium carbonate (TUMS - DOSED IN MG ELEMENTAL CALCIUM) 500 MG chewable tablet Chew 2 tablets by mouth at bedtime.    [provider]  clopidogrel (PLAVIX) 75 MG tablet Take 75 mg by mouth daily. 11/26/19   [provider]  Cranberry 450 MG CAPS Take 2 tablets by mouth 2 (two) times daily.    [provider]  digoxin (LANOXIN) 0.25 MG tablet Take 250 mcg by mouth daily.     [provider]  divalproex (DEPAKOTE) 125 MG DR tablet Take 250 mg by mouth 2 (two) times daily.  12/19/19   [provider]  DULoxetine (CYMBALTA) 60 MG capsule Take 60 mg by mouth 2 (two) times daily.    [provider]  Ensure (ENSURE) Take 237 mLs by mouth 2 (two) times daily between meals. Patient not taking: Reported on 07/05/2020    [provider]  fenofibrate 160 MG tablet Take 160 mg by mouth daily. 11/09/19   [provider]  FLUoxetine (PROZAC) 20 MG tablet Take 20 mg by mouth daily. Patient not taking: Reported on  07/05/2020    [provider]  furosemide (LASIX) 20 MG tablet Take 1 tablet (20 mg total) by mouth daily. 07/08/20 11/05/20  Barton Dubois, MD  insulin glargine (LANTUS) 100 UNIT/ML injection Inject 40 Units into the skin daily.     [provider]  insulin lispro (HUMALOG) 100 UNIT/ML injection Inject 2-15 Units into the skin in the morning, at noon, in the evening, and at bedtime. Before meals and at bedtime. Per sliding scale    [provider]  lidocaine (LIDODERM) 5 % Place 3 patches onto the skin daily. Remove & Discard patch within 12 hours to lower back and each knee stump. (1 patch for each body part)    [provider]  melatonin 3 MG TABS tablet Take 3 mg by mouth at bedtime.    [provider]  methenamine (HIPREX) 1 g tablet Take 1 g by mouth daily.    [provider]  metoprolol succinate (TOPROL-XL) 25 MG 24 hr tablet Take 1 tablet (25 mg total) by mouth 2 (two) times daily. 07/06/20   Barton Dubois, MD  Multiple Vitamin (MULTIVITAMIN) tablet Take 1 tablet by mouth daily.    [provider]  nitroGLYCERIN (NITROSTAT) 0.4 MG SL tablet Place 0.4 mg under the tongue every 5 (five) minutes as needed for chest pain.    [provider]  ondansetron (ZOFRAN) 4 MG tablet Take 1 tablet (4 mg total) by mouth every 8 (eight) hours as needed for nausea or vomiting. 03/06/20   Rolland Porter, MD  pantoprazole (PROTONIX) 20 MG tablet Take 1 tablet (20 mg total) by mouth 2 (two) times daily. 07/06/20   Barton Dubois, MD  polyethylene glycol (MIRALAX / GLYCOLAX) 17 g packet Take 17 g by mouth daily as needed.     [provider]  Potassium Chloride ER 20 MEQ TBCR Take 2 tablets by mouth daily.    [provider]  pregabalin (LYRICA) 75 MG capsule Take 75 mg by mouth 3 (three) times daily.    [provider]  QUEtiapine (SEROQUEL) 25 MG tablet Take 25 mg by mouth 2 (two) times daily.    [provider]   Semaglutide, 1 MG/DOSE, (OZEMPIC, 1 MG/DOSE,) 2 MG/1.5ML SOPN Inject 1 mg into the skin once a week. On fridays 12/10/19   [provider]  traMADol (ULTRAM) 50 MG tablet Take 100 mg by mouth every 8 (eight) hours.    [provider]  vitamin B-12 (CYANOCOBALAMIN) 500 MCG tablet Take 1,000 mcg by mouth daily.    [provider]    Physical Exam: BP (!) 91/48   Pulse 83   Temp 98.1 F (36.7 C) (Oral)   Resp 12   Ht 3' (0.914 m) Comment: [id number] , bilateral AKA  Wt 46.3 kg   SpO2 99%   BMI 55.33 kg/m   . General: 54 y.o. year-old female well developed well nourished in no acute distress.  Alert and oriented x3. Marland Kitchen HEENT: NCAT, EOMI . Neck: Supple, trachea midline . Cardiovascular: Regular rate and rhythm with no rubs or gallops.  No thyromegaly or JVD noted.   Marland Kitchen Respiratory: Clear to auscultation with no wheezes or rales. Good inspiratory effort. . Abdomen: Soft nontender nondistended with normal bowel sounds x4 quadrants. . Muskuloskeletal: Bilateral AKA. No cyanosis or clubbing noted bilaterally . Neuro: Left upper extremity weakness, left-sided facial droop.  . Skin:8-9 inch sacral decubitus ulcer with purulent foul-smelling drainage.  Marland Kitchen Psychiatry: Judgement and insight appear normal. Mood is appropriate for condition and setting          Labs on Admission:  Basic Metabolic Panel: Recent Labs  Lab 10/20/20 1758  NA 140  K 3.5  CL 107  CO2 25  GLUCOSE 60*  BUN 15  CREATININE 0.30*  CALCIUM 7.7*   Liver Function Tests: Recent Labs  Lab 10/20/20 1758  AST 32  ALT 21  ALKPHOS 195*  BILITOT 0.4  PROT 5.9*  ALBUMIN 2.0*   No results for input(s): LIPASE, AMYLASE in the last 168 hours. No results for input(s): AMMONIA in the last 168 hours. CBC: Recent Labs  Lab 10/20/20 1758  WBC 10.3  NEUTROABS 7.1  HGB 9.7*  HCT 30.8*  MCV 94.2  PLT 365   Cardiac Enzymes: No results for input(s): CKTOTAL, CKMB, CKMBINDEX, TROPONINI in the  last 168 hours.  BNP (last 3 results) No results for input(s): BNP in the last 8760 hours.  ProBNP (last 3 results) No results for input(s): PROBNP in the last 8760 hours.  CBG: Recent Labs  Lab 10/20/20 2115  GLUCAP 39*  Radiological Exams on Admission: DG Chest Port 1 View  Result Date: 10/20/2020 CLINICAL DATA:  Wound infection EXAM: PORTABLE CHEST 1 VIEW COMPARISON:  07/05/2020 FINDINGS: The heart size and mediastinal contours are within normal limits. Both lungs are clear. Aortic atherosclerosis. IMPRESSION: No active disease. Electronically Signed   By: Donavan Foil M.D.   On: 10/20/2020 17:52    EKG: I independently viewed the EKG done and my findings are as followed: Sinus rhythm at rate of 92 bpm  Assessment/Plan Present on Admission: . Hypotension . Obesity . HTN (hypertension) . GERD (gastroesophageal reflux disease)  Active Problems:   Type 2 diabetes mellitus (HCC)   HTN (hypertension)   Obesity   GERD (gastroesophageal reflux disease)   Hypotension   Sacral decubitus ulcer   Lactic acidosis   Hypoglycemia   Hypoalbuminemia   Elevated alpha fetoprotein  Infected sacral decubitus ulcer with superimposed hypotension Right arm PICC line was placed at the nursing facility and patient was started on IV antibiotics about 2 days ago per report, she was noted to have hypotension while at the facility and 500 mL of bolus without much improvement was provided in route to the hospital. Patient was started on IV vancomycin and ceftriaxone, we shall continue with IV vancomycin and IV cefepime with plan to de-escalate based on blood culture Continue wound consult General surgery will be consulted for evaluation of possible I & D and for further recommendation Patient will be placed n.p.o. after midnight based on above.  Lactic acidosis- resolved after IV hydration Lactic acid 2.4> 1.7  Hypoglycemia in the setting of type 2 diabetes mellitus Blood glucose 60>  39; dextrose D50 was given Continue IV D5 NS and continue SSI with hypoglycemic protocol  Hypoalbuminemia secondary to moderate protein caloric malnutrition Protein supplement will be provided  Isolated elevated ALP ALP of 195, continue to monitor liver enzymes  Essential hypertension BP meds will be held at this time due to hypotension  Hyperlipidemia Continue atorvastatin  GERD Continue Protonix  History of CVA/CAD/PAD Patient denies chest pain or SOB Continue aspirin, Plavix and statins   DVT prophylaxis: None; patient is bilateral AKA; chemoprophylaxis temporarily held in anticipation for possible surgical procedure in the morning.  Code Status: Full code  Family Communication: Daughter at bedside (all questions answered to satisfaction)  Disposition Plan:  Patient is from:                        SNF Anticipated DC to:                   SNF  Anticipated DC date:               2-3 days Anticipated DC barriers:          Patient is unstable to be discharged at this time due to infected sacral decubitus ulcer with hypotension requiring IV fluid and pending surgical evaluation to rule out indication for surgical intervention   Consults called: General surgery  Admission status: Inpatient    Bernadette Hoit MD [facility]  10/20/2020, 9:43 PM

## 2020-10-20 NOTE — ED Provider Notes (Signed)
[facility] EMERGENCY DEPARTMENT Provider Note   CSN: [id number] Arrival date & time: 10/20/20  1709     History Chief Complaint  Patient presents with  . Wound Infection    Erica Richwine is a 54 y.o. female.  HPI    54 y/o female - with hx of PVD - s/p AKA bialteral legs as well as a prior stroke that has left her with L sided arm weakness and l sided facial droop.  This patient is currently living at [facility], she was diagnosed with a sacral decubitus ulcer several years ago and has struggled with this healing correctly over time however recently it was noted that it was foul-smelling, enlarging and thus antibiotics were started through a PICC line in her right upper extremity.  I have reviewed her medical record, it shows that she is also diabetic, she has a history of anemia, polyneuropathy, schizoaffective bipolar, heart failure and a prior stroke as noted above, she takes Abilify, baby aspirin, atorvastatin, baclofen and has recently been started on ceftriaxone for the infection.  She is also on Plavix, Cymbalta, Depakote, digoxin, metoprolol, insulin, Ozempic, Seroquel and Zofran as needed.  Evidently the patient's daughter saw her at her facility over the last couple of days and was concerned that she was not getting adequate care for her infection and thus requested that she be transferred to the hospital for evaluation.  The patient states she does have discomfort in her bottom, she does not have any fevers chills nausea or vomiting.  It was noted that she was hypotensive prehospital and 500 cc bolus was given with no significant improvement in her blood pressure.  Past Medical History:  Diagnosis Date  . Anemia   . Anxiety   . Asthma   . Cigarette smoker 02/25/2014  . COPD (chronic obstructive pulmonary disease) ([place])   . Diabetes mellitus without complication ([place])   . GERD (gastroesophageal reflux disease)   . Hx of ischemic right MCA stroke 02/25/2014  .  Hypertension   . MI (myocardial infarction) ([place])   . Obesity, unspecified 02/25/2014  . Stroke [facility])     Patient Active Problem List   Diagnosis Date Noted  . Hypotension 10/20/2020  . Pressure injury of skin 07/06/2020  . Encephalopathy acute 07/05/2020  . History of acute anterior wall MI 10/24/2016  . 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 Chronic systolic congestive heart failure ([place]) 10/24/2016  . Cardiomyopathy, ischemic 10/24/2016  . Sinus tachycardia 10/24/2016  . Sepsis ([place]) 12/20/2015  . UTI (lower urinary tract infection) 12/20/2015  . COPD (chronic obstructive pulmonary disease) ([place]) 12/20/2015  . GERD (gastroesophageal reflux disease) 12/20/2015  . Type 2 diabetes mellitus ([place]) 02/25/2014  . HTN (hypertension) 02/25/2014  . Cigarette smoker 02/25/2014  . Obesity 02/25/2014  . Hx of ischemic right MCA stroke, 1998 02/25/2014  . Cerebral infarction ([employer]) 02/20/2014    Past Surgical History:  Procedure Laterality Date  . ABOVE KNEE LEG AMPUTATION Bilateral   . ANGIOPLASTY       OB History   No obstetric history on file.     Family History  Problem Relation Age of Onset  . Vascular Disease Other     Social History   Tobacco Use  . Smoking status: Former Smoker    Packs/day: 0.50    Types: Cigarettes  . Smokeless tobacco: Never Used  Vaping Use  . Vaping Use: Never used  Substance Use Topics  . Alcohol use:  No  . Drug use: No    Home Medications Prior to Admission medications   Medication Sig Start Date End Date Taking? Authorizing Provider  acetaminophen (TYLENOL) 500 MG tablet Take 1,000 mg by mouth 2 (two) times daily.    [provider]  ALPRAZolam Duanne Moron) 0.5 MG tablet Take 0.5 mg by mouth 2 (two) times daily.     [provider]  ARIPiprazole (ABILIFY) 5 MG tablet Take 5 mg by mouth daily. 12/13/19   [provider]  aspirin EC 81 MG tablet Take 81 mg by mouth daily. Swallow whole.     [provider]  atorvastatin (LIPITOR) 80 MG tablet Take 80 mg by mouth daily.    [provider]  Baclofen 5 MG TABS Take 1 tablet by mouth 3 (three) times daily.     [provider]  calcium carbonate (TUMS - DOSED IN MG ELEMENTAL CALCIUM) 500 MG chewable tablet Chew 2 tablets by mouth at bedtime.    [provider]  clopidogrel (PLAVIX) 75 MG tablet Take 75 mg by mouth daily. 11/26/19   [provider]  Cranberry 450 MG CAPS Take 2 tablets by mouth 2 (two) times daily.    [provider]  digoxin (LANOXIN) 0.25 MG tablet Take 250 mcg by mouth daily.     [provider]  divalproex (DEPAKOTE) 125 MG DR tablet Take 250 mg by mouth 2 (two) times daily.  12/19/19   [provider]  DULoxetine (CYMBALTA) 60 MG capsule Take 60 mg by mouth 2 (two) times daily.    [provider]  Ensure (ENSURE) Take 237 mLs by mouth 2 (two) times daily between meals. Patient not taking: Reported on 07/05/2020    [provider]  fenofibrate 160 MG tablet Take 160 mg by mouth daily. 11/09/19   [provider]  FLUoxetine (PROZAC) 20 MG tablet Take 20 mg by mouth daily. Patient not taking: Reported on 07/05/2020    [provider]  furosemide (LASIX) 20 MG tablet Take 1 tablet (20 mg total) by mouth daily. 07/08/20 11/05/20  Barton Dubois, MD  insulin glargine (LANTUS) 100 UNIT/ML injection Inject 40 Units into the skin daily.     [provider]  insulin lispro (HUMALOG) 100 UNIT/ML injection Inject 2-15 Units into the skin in the morning, at noon, in the evening, and at bedtime. Before meals and at bedtime. Per sliding scale    [provider]  lidocaine (LIDODERM) 5 % Place 3 patches onto the skin daily. Remove & Discard patch within 12 hours to lower back and each knee stump. (1 patch for each body part)    [provider]  melatonin 3 MG TABS tablet Take 3 mg by mouth at bedtime.     [provider]  methenamine (HIPREX) 1 g tablet Take 1 g by mouth daily.    [provider]  metoprolol succinate (TOPROL-XL) 25 MG 24 hr tablet Take 1 tablet (25 mg total) by mouth 2 (two) times daily. 07/06/20   Barton Dubois, MD  Multiple Vitamin (MULTIVITAMIN) tablet Take 1 tablet by mouth daily.    [provider]  nitroGLYCERIN (NITROSTAT) 0.4 MG SL tablet Place 0.4 mg under the tongue every 5 (five) minutes as needed for chest pain.    [provider]  ondansetron (ZOFRAN) 4 MG tablet Take 1 tablet (4 mg total) by mouth every 8 (eight) hours as needed for nausea or vomiting. 03/06/20   Rolland Porter, MD  pantoprazole (PROTONIX) 20 MG tablet Take 1 tablet (20 mg total) by mouth 2 (two) times daily. 07/06/20   Barton Dubois, MD  polyethylene glycol (MIRALAX / GLYCOLAX) 17 g packet Take 17 g by mouth daily as needed.     [provider]  Potassium Chloride ER 20 MEQ TBCR Take 2 tablets by mouth daily.    [provider]  pregabalin (LYRICA) 75 MG capsule Take 75 mg by mouth 3 (three) times daily.    [provider]  QUEtiapine (SEROQUEL) 25 MG tablet Take 25 mg by mouth 2 (two) times daily.    [provider]  Semaglutide, 1 MG/DOSE, (OZEMPIC, 1 MG/DOSE,) 2 MG/1.5ML SOPN Inject 1 mg into the skin once a week. On fridays 12/10/19   [provider]  traMADol (ULTRAM) 50 MG tablet Take 100 mg by mouth every 8 (eight) hours.    [provider]  vitamin B-12 (CYANOCOBALAMIN) 500 MCG tablet Take 1,000 mcg by mouth daily.    [provider]    Allergies    Morphine, Ace inhibitors, Flexeril [cyclobenzaprine], Paxil [paroxetine hcl], and Silver sulfadiazine  Review of Systems   Review of Systems  All other systems reviewed and are negative.   Physical Exam Updated Vital Signs BP (!) 91/48   Pulse 83   Temp 98.1 F (36.7 C) (Oral)   Resp 12   Ht 0.914 m (3') Comment: [id number] , bilateral AKA  Wt  46.3 kg   SpO2 99%   BMI 55.33 kg/m   Physical Exam Vitals and nursing note reviewed.  Constitutional:      General: She is not in acute distress.    Appearance: She is well-developed.  HENT:     Head: Normocephalic and atraumatic.     Mouth/Throat:     Mouth: Mucous membranes are moist.     Pharynx: No oropharyngeal exudate.  Eyes:     General: No scleral icterus.       Right eye: No discharge.        Left eye: No discharge.     Conjunctiva/sclera: Conjunctivae normal.     Pupils: Pupils are equal, round, and reactive to light.  Neck:     Thyroid: No thyromegaly.     Vascular: No JVD.  Cardiovascular:     Rate and Rhythm: Normal rate and regular rhythm.     Heart sounds: Normal heart sounds. No murmur heard.  No friction rub. No gallop.   Pulmonary:     Effort: Pulmonary effort is normal. No respiratory distress.     Breath sounds: Normal breath sounds. No wheezing or rales.  Abdominal:     General: Bowel sounds are normal. There is no distension.     Palpations: Abdomen is soft. There is no mass.     Tenderness: There is no abdominal tenderness.  Musculoskeletal:        General: No tenderness. Normal range of motion.     Cervical back: Normal range of motion and neck supple.  Lymphadenopathy:     Cervical: No cervical adenopathy.  Skin:    General: Skin is warm and dry.     Findings: No erythema or rash.     Comments: Approximately 8 or 9 inch decubitus wound open over the sacral area with purulent foul-smelling drainage, there is some redness  Neurological:     Mental Status: She is alert.     Coordination: Coordination normal.     Comments: The patient is awake and alert,  there is left-sided facial droop, there is left upper extremity weakness, she has no other focal weakness compared to baseline  Psychiatric:        Behavior: Behavior normal.     ED Results / Procedures / Treatments   Labs (all labs ordered are listed, but only abnormal results are  displayed) Labs Reviewed  LACTIC ACID, PLASMA - Abnormal; Notable for the following components:      Result Value   Lactic Acid, Venous 2.4 (*)    All other components within normal limits  COMPREHENSIVE METABOLIC PANEL - Abnormal; Notable for the following components:   Glucose, Bld 60 (*)    Creatinine, Ser 0.30 (*)    Calcium 7.7 (*)    Total Protein 5.9 (*)    Albumin 2.0 (*)    Alkaline Phosphatase 195 (*)    All other components within normal limits  CBC WITH DIFFERENTIAL/PLATELET - Abnormal; Notable for the following components:   RBC 3.27 (*)    Hemoglobin 9.7 (*)    HCT 30.8 (*)    All other components within normal limits  APTT - Abnormal; Notable for the following components:   aPTT 47 (*)    All other components within normal limits  VALPROIC ACID LEVEL - Abnormal; Notable for the following components:   Valproic Acid Lvl 20 (*)    All other components within normal limits  CULTURE, BLOOD (ROUTINE X 2)  CULTURE, BLOOD (ROUTINE X 2)  URINE CULTURE  RESPIRATORY PANEL BY RT PCR (FLU A&B, COVID)  AEROBIC CULTURE (SUPERFICIAL SPECIMEN)  PROTIME-INR  LACTIC ACID, PLASMA  URINALYSIS, ROUTINE W REFLEX MICROSCOPIC  DIGOXIN LEVEL    EKG EKG Interpretation  Date/Time:  Friday October 20 2020 17:41:25 EDT Ventricular Rate:  92 PR Interval:    QRS Duration: 83 QT Interval:  288 QTC Calculation: 357 R Axis:   64 Text Interpretation: Sinus rhythm Low voltage, extremity and precordial leads since last tracing no significant change Confirmed by Noemi Chapel [phone]) on 10/20/2020 5:52:01 PM   Radiology DG Chest Port 1 View  Result Date: 10/20/2020 CLINICAL DATA:  Wound infection EXAM: PORTABLE CHEST 1 VIEW COMPARISON:  07/05/2020 FINDINGS: The heart size and mediastinal contours are within normal limits. Both lungs are clear. Aortic atherosclerosis. IMPRESSION: No active disease. Electronically Signed   By: Donavan Foil M.D.   On: 10/20/2020 17:52     Procedures .Critical Care Performed by: Noemi Chapel, MD Authorized by: Noemi Chapel, MD   Critical care provider statement:    Critical care time (minutes):  35   Critical care time was exclusive of:  Separately billable procedures and treating other patients and teaching time   Critical care was necessary to treat or prevent imminent or life-threatening deterioration of the following conditions:  Sepsis   Critical care was time spent personally by me on the following activities:  Blood draw for specimens, development of treatment plan with patient or surrogate, discussions with consultants, evaluation of patient's response to treatment, examination of patient, obtaining history from patient or surrogate, ordering and performing treatments and interventions, ordering and review of laboratory studies, ordering and review of radiographic studies, pulse oximetry, re-evaluation of patient's condition and review of old charts   (including critical care time)  Medications Ordered in ED Medications  cefTRIAXone (ROCEPHIN) 2 g in sodium chloride 0.9 % 100 mL IVPB (has no administration in time range)  lactated ringers bolus 1,000 mL (0 mLs Intravenous Stopped 10/20/20 1849)    And  lactated ringers bolus 500 mL (0 mLs Intravenous Stopped 10/20/20 1849)  vancomycin (VANCOCIN) IVPB 1000 mg/200 mL premix (1,000 mg Intravenous New Bag/Given 10/20/20 1859)  cefTRIAXone (ROCEPHIN) 2 g in sodium chloride 0.9 % 100 mL IVPB (0 g Intravenous Stopped 10/20/20 1934)    ED Course  I have reviewed the triage vital signs and the nursing notes.  Pertinent labs & imaging results that were available during my care of the patient were reviewed by me and considered in my medical decision making (see chart for details).    MDM Rules/Calculators/A&P                          Of note this patient is hypotensive with a blood pressure of 79 systolic, heart rate is 95, she is afebrile.  Because of this large  sacral infected wound and her hypotension a code sepsis was activated.  It is unclear what her baseline blood pressure is.  Prior blood pressure measurements recorded in the hospital from July 8 showed the patient had a blood pressure of 129/73, there were multiple blood pressure measurements in that range during that visit.  This patient is critically ill with possible sepsis.  Lactic acid elevated at 2.4, the patient has a slightly improved blood pressure with IV fluids, she is not requiring vasopressors at this time, her mental status is slightly improved as well. Family member arrives stating that she is just not been herself for the last 3 weeks laying in bed more often and not getting up to a wheelchair. I suspect this is what is worse in the decubitus ulcer. It has a foul smell, it appears large and will need to probably have some debridement, antibiotics have been ordered, cultures of the wound to been ordered, discussed with the hospitalist to admit. The patient is being treated for sepsis  Final Clinical Impression(s) / ED Diagnoses Final diagnoses:  Sepsis, due to unspecified organism, unspecified whether acute organ dysfunction present ([place])  Pressure injury of sacral region, stage 4 ([facility])      Noemi Chapel, MD 10/20/20 2004

## 2020-10-20 NOTE — Progress Notes (Addendum)
Pharmacy Antibiotic Note  [name] is a 54 y.o. female brought to the ED with a wound infection and sepsis .  Pharmacy has been consulted for cefepime and Vancomycin dosing for wound infection.  Plan: Cefepime 2 gm IV q 24 hr. Vancomycin 1 gm IV load and then 1 gm IV q 24 hr ( on adjusted CrCl of 47.5 for AKA).   Monitor renal function, C&S, pt status, and order vancomycin levels when needed.   Height: 3' (91.4 cm) ([id number] , bilateral AKA) Weight: 46.3 kg (102 lb) IBW/kg (Calculated) : -9.7  Temp (24hrs), Avg:98.1 F (36.7 C), Min:98.1 F (36.7 C), Max:98.1 F (36.7 C)  Recent Labs  Lab 10/20/20 1758 10/20/20 1952  WBC 10.3  --   CREATININE 0.30*  --   LATICACIDVEN 2.4* 1.7    Estimated Creatinine Clearance: 16.3 mL/min (A) (by C-G formula based on SCr of 0.3 mg/dL (L)).    Allergies  Allergen Reactions  . Morphine Other (See Comments)    PO form causes reaction Other reaction(s): SHORTNESS OF BREATH PO form causes reaction Other reaction(s): SHORTNESS OF BREATH   . Ace Inhibitors   . Flexeril [Cyclobenzaprine]   . Paxil [Paroxetine Hcl]   . Silver Sulfadiazine Dermatitis    Antimicrobials this admission:  Ceftriaxone 10/22 >> Vancomycin 10/22 >>  Cefepime 10/22 >>  Dosage adjustments this admission: Cefepime dose adjusted based on CrCl of 47.5 for AKA    Microbiology results: Wound Cx: pending UCx: pending  Thank you for allowing pharmacy to be a part of this patient's care.  Blenda Nicely 10/20/2020 8:39 PM

## 2020-10-20 NOTE — ED Triage Notes (Signed)
Arrived EMS from [place]. Daughter wanted pt to come to ER because she feels like her mom is not being taken care of and wants her sacrum would checked out. Per report pt had been on ABT( Celtriaxone 1 gram IV daily x 7 days. Started 10/19/20. B/p low in EMS and 1000 bolus given . B/p now 85/51. Pt alert and verbal. Hair tangled in huge knot, large sacrum wound with necrosis noted to it. Foul smell and slimy substance noted.

## 2020-10-20 NOTE — ED Notes (Signed)
Attempted flexiseal x3 with no success the bulb keeps coming out. Pt had 2 large stool balls come out while trying to place tube.

## 2020-10-21 ENCOUNTER — Other Ambulatory Visit: Payer: Self-pay

## 2020-10-21 ENCOUNTER — Encounter (HOSPITAL_COMMUNITY): Payer: Self-pay | Admitting: Internal Medicine

## 2020-10-21 DIAGNOSIS — L89159 Pressure ulcer of sacral region, unspecified stage: Secondary | ICD-10-CM | POA: Diagnosis not present

## 2020-10-21 LAB — COMPREHENSIVE METABOLIC PANEL
ALT: 22 U/L (ref 0–44)
AST: 29 U/L (ref 15–41)
Albumin: 2.1 g/dL — ABNORMAL LOW (ref 3.5–5.0)
Alkaline Phosphatase: 209 U/L — ABNORMAL HIGH (ref 38–126)
Anion gap: 6 (ref 5–15)
BUN: 10 mg/dL (ref 6–20)
CO2: 26 mmol/L (ref 22–32)
Calcium: 8.1 mg/dL — ABNORMAL LOW (ref 8.9–10.3)
Chloride: 104 mmol/L (ref 98–111)
Creatinine, Ser: 0.31 mg/dL — ABNORMAL LOW (ref 0.44–1.00)
GFR, Estimated: >60 mL/min (ref >60)
Glucose, Bld: 149 mg/dL — ABNORMAL HIGH (ref 70–99)
Potassium: 3.2 mmol/L — ABNORMAL LOW (ref 3.5–5.1)
Sodium: 136 mmol/L (ref 135–145)
Total Bilirubin: 0.5 mg/dL (ref 0.3–1.2)
Total Protein: 6.2 g/dL — ABNORMAL LOW (ref 6.5–8.1)

## 2020-10-21 LAB — CBC
HCT: 32 % — ABNORMAL LOW (ref 36.0–46.0)
Hemoglobin: 10.2 g/dL — ABNORMAL LOW (ref 12.0–15.0)
MCH: 29 pg (ref 26.0–34.0)
MCHC: 31.9 g/dL (ref 30.0–36.0)
MCV: 90.9 fL (ref 80.0–100.0)
Platelets: 377 10*3/uL (ref 150–400)
RBC: 3.52 MIL/uL — ABNORMAL LOW (ref 3.87–5.11)
RDW: 15.2 % (ref 11.5–15.5)
WBC: 11.9 10*3/uL — ABNORMAL HIGH (ref 4.0–10.5)
nRBC: 0 % (ref 0.0–0.2)

## 2020-10-21 LAB — APTT: aPTT: 48 seconds — ABNORMAL HIGH (ref 24–36)

## 2020-10-21 LAB — CBG MONITORING, ED
Glucose-Capillary: 165 mg/dL — ABNORMAL HIGH (ref 70–99)
Glucose-Capillary: 207 mg/dL — ABNORMAL HIGH (ref 70–99)
Glucose-Capillary: 77 mg/dL (ref 70–99)
Glucose-Capillary: 97 mg/dL (ref 70–99)

## 2020-10-21 LAB — URINALYSIS, ROUTINE W REFLEX MICROSCOPIC
Bilirubin Urine: NEGATIVE
Glucose, UA: NEGATIVE mg/dL
Ketones, ur: NEGATIVE mg/dL
Leukocytes,Ua: NEGATIVE
Nitrite: NEGATIVE
Protein, ur: NEGATIVE mg/dL
Specific Gravity, Urine: 1.013 (ref 1.005–1.030)
pH: 7 (ref 5.0–8.0)

## 2020-10-21 LAB — PROTIME-INR
INR: 1.2 (ref 0.8–1.2)
Prothrombin Time: 14.2 seconds (ref 11.4–15.2)

## 2020-10-21 LAB — GLUCOSE, CAPILLARY
Glucose-Capillary: 118 mg/dL — ABNORMAL HIGH (ref 70–99)
Glucose-Capillary: 125 mg/dL — ABNORMAL HIGH (ref 70–99)

## 2020-10-21 LAB — PHOSPHORUS: Phosphorus: 1.9 mg/dL — ABNORMAL LOW (ref 2.5–4.6)

## 2020-10-21 LAB — MRSA PCR SCREENING: MRSA by PCR: NEGATIVE

## 2020-10-21 LAB — MAGNESIUM: Magnesium: 1 mg/dL — ABNORMAL LOW (ref 1.7–2.4)

## 2020-10-21 MED ORDER — INFLUENZA VAC SPLIT QUAD 0.5 ML IM SUSY
0.5000 mL | PREFILLED_SYRINGE | INTRAMUSCULAR | Status: DC
  Filled 2020-10-21: qty 0.5

## 2020-10-21 MED ORDER — TRAZODONE HCL 50 MG PO TABS: 50.0000 mg | ORAL_TABLET | Freq: Once | ORAL | Status: DC

## 2020-10-21 MED ORDER — PNEUMOCOCCAL VAC POLYVALENT 25 MCG/0.5ML IJ INJ
0.5000 mL | INJECTION | INTRAMUSCULAR | Status: AC
  Administered 2020-10-22: 0.5 mL via INTRAMUSCULAR
  Filled 2020-10-21: qty 0.5

## 2020-10-21 MED ORDER — ENOXAPARIN SODIUM 30 MG/0.3ML ~~LOC~~ SOLN
30.0000 mg | SUBCUTANEOUS | Status: DC
  Administered 2020-10-21: 30 mg via SUBCUTANEOUS
  Filled 2020-10-21: qty 0.3

## 2020-10-21 MED ORDER — KETOROLAC TROMETHAMINE 15 MG/ML IJ SOLN
15.0000 mg | Freq: Three times a day (TID) | INTRAMUSCULAR | Status: AC | PRN
  Administered 2020-10-21 (×2): 15 mg via INTRAVENOUS
  Filled 2020-10-21 (×2): qty 1

## 2020-10-21 MED ORDER — POTASSIUM CHLORIDE CRYS ER 20 MEQ PO TBCR
40.0000 meq | EXTENDED_RELEASE_TABLET | Freq: Once | ORAL | Status: AC
  Administered 2020-10-21: 40 meq via ORAL
  Filled 2020-10-21: qty 2

## 2020-10-21 MED ORDER — CHLORHEXIDINE GLUCONATE CLOTH 2 % EX PADS
6.0000 | MEDICATED_PAD | Freq: Every day | CUTANEOUS | Status: DC
  Administered 2020-10-21 – 2020-10-23 (×3): 6 via TOPICAL

## 2020-10-21 MED ORDER — MAGNESIUM SULFATE 2 GM/50ML IV SOLN
2.0000 g | Freq: Once | INTRAVENOUS | Status: AC
  Administered 2020-10-21: 2 g via INTRAVENOUS
  Filled 2020-10-21: qty 50

## 2020-10-21 MED ORDER — COLLAGENASE 250 UNIT/GM EX OINT
TOPICAL_OINTMENT | Freq: Every day | CUTANEOUS | Status: DC
  Filled 2020-10-21: qty 30

## 2020-10-21 MED ORDER — SODIUM CHLORIDE 0.9 % IV SOLN
2.0000 g | Freq: Two times a day (BID) | INTRAVENOUS | Status: DC
  Administered 2020-10-21 – 2020-10-23 (×5): 2 g via INTRAVENOUS
  Filled 2020-10-21 (×5): qty 2

## 2020-10-21 NOTE — Progress Notes (Signed)
Elink Code Sepsis Completion Note:  LA 1.7, BC drawn before ABX administered. Received allotted IVF bolus based on weight. Admitted to SD unit for further monitoring.   Dhani Imel Hyrum [employer] RN

## 2020-10-21 NOTE — Progress Notes (Signed)
Pharmacy Antibiotic Note  [name] is a 54 y.o. female brought to the ED with a wound infection and sepsis .  Pharmacy has been consulted for cefepime and Vancomycin dosing for wound infection.  Plan: Increase Cefepime 2 gm IV q 12 hr. Continue Vancomycin 1 gm  IV q 24 hr ( on adjusted CrCl of 42 for AKA).  Monitor renal function, C&S, pt status, and order vancomycin levels when needed.   Height: 3' (91.4 cm) ([id number] , bilateral AKA) Weight: 46.3 kg (102 lb) IBW/kg (Calculated) : -9.7  Temp (24hrs), Avg:98.1 F (36.7 C), Min:98.1 F (36.7 C), Max:98.1 F (36.7 C)  Recent Labs  Lab 10/20/20 1758 10/20/20 1952 10/21/20 0423  WBC 10.3  --  11.9*  CREATININE 0.30*  --  0.31*  LATICACIDVEN 2.4* 1.7  --     Estimated Creatinine Clearance: 16.3 mL/min (A) (by C-G formula based on SCr of 0.31 mg/dL (L)).    Allergies  Allergen Reactions  . Morphine Other (See Comments)    PO form causes reaction Other reaction(s): SHORTNESS OF BREATH PO form causes reaction Other reaction(s): SHORTNESS OF BREATH   . Ace Inhibitors   . Flexeril [Cyclobenzaprine]   . Paxil [Paroxetine Hcl]   . Silver Sulfadiazine Dermatitis    Antimicrobials this admission: Ceftriaxone 10/22 >>10/22 Vancomycin 10/22 >>  Cefepime 10/22 >>  Dosage adjustments this admission: Cefepime dose adjusted based on CrCl of 42 for AKA  Microbiology results: Wound Cx: pending UCx: pending  Thank you for allowing pharmacy to be a part of this patient's care.  Isac Sarna, BS [place]ena [place], BCPS Clinical Pharmacist Pager [phone] Cristy Friedlander 10/21/2020 9:58 AM

## 2020-10-21 NOTE — Progress Notes (Signed)
PROGRESS NOTE    [name]  MVH:[id number] DOB: [birth year] DOA: 10/20/2020 PCP: Caprice Renshaw, MD   Brief Narrative:  Per HPI: [name] is a 54 y.o. 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 Daughter was at bedside and provided most of the history. Patient states that she has had decreased appetite and felt weak and has been more bedbound within last couple of weeks (usually able to self transfer from bed to wheelchair at baseline), she has history of sacral decubitus several years ago and has had struggle with healing correctly of this wound, recently she was noted to have an infection of the wound, PICC line was placed at the nursing facility and patient was started on IV antibiotic. Daughter states that she went to see her mom (patient) over the last couple of days and was concerned that she was not getting adequate care for infection, so she requested for patient to be transferred to the ED for further evaluation. On arrival of EMS, daughter states that patient's SBP was noted to be in the 70s and IV 500 cc bolus was given with no significant improvement in blood pressure.  She denies fever, chills, shortness of breath, chest pain, shortness of breath, nausea, vomiting or abdominal pain. Last bowel movement was yesterday.  ED Course:  In the emergency department, she was hypotensive with a BP of 79/54, but other vital signs were within normal range. Work-up in the ED showed normocytic anemia, hypoglycemia, lactic acid of 2.4, hypoalbuminemia, ALP 195. RSV for influenza A and B as well as SARS coronavirus 2 were negative. Chest x-ray showed no active disease. She was started on vancomycin and ceftriaxone. Hospitalist was asked to  admit patient for further evaluation and management.  10/23: Patient was admitted with infected sacral decubitus ulcer with worsening pain despite initiation of IV antibiotics and her skilled nursing facility.  Continue vancomycin and Rocephin as ordered.  Seen by general surgery for consideration of debridement with plans to pursue this by 10/25 in the OR.  Resume diet for now and hold aspirin and Plavix.  Assessment & Plan:   Active Problems:   Type 2 diabetes mellitus with hyperlipidemia (HCC)   HTN (hypertension)   Obesity   Hx of ischemic right MCA stroke, 1998   GERD (gastroesophageal reflux disease)   Hypotension   Sacral decubitus ulcer   Lactic acidosis   Hypoglycemia   Hypoalbuminemia   Elevated alpha fetoprotein   Infected, unstageable sacral ulcer -Continue on cefepime and vancomycin as ordered -Blood cultures with no growth noted thus far and anaerobic culture pending -Monitor CBC -Appreciate general surgery consultation with plans for OR debridement by 10/25 -Hold aspirin and Plavix for now per GS  Lactic acidosis-resolved with IV hydration -Continue monitoring  Type 2 diabetes with some initial hypoglycemia -This appears to be resolved with D5 infusion -Start carb modified diet as ordered and monitor  Hypoalbuminemia secondary to moderate protein calorie malnutrition -Protein supplementation  Essential hypertension -Hold BP meds with ongoing soft blood pressure readings  Dyslipidemia -Continue atorvastatin  GERD -Protonix  History of CVA/CAD/PAD -Continue on statin and hold aspirin and Plavix for now with anticipated OR debridement 10/25  DVT prophylaxis: Started on Lovenox today with plan to hold on 10/24 Code Status: Full code Family Communication: Discussed with  daughter on phone 10/23 Disposition Plan:  Status is: Inpatient  Remains inpatient appropriate because:IV treatments appropriate due to intensity of illness or inability to take PO and  Inpatient level of care appropriate due to severity of illness   Dispo: The patient is from: SNF              Anticipated d/c is to: SNF              Anticipated d/c date is: 2 days              Patient currently is not medically stable to d/c.  Patient will require ongoing IV antibiotics as ordered with plans for debridement in OR by 10/25.   Consultants:   General surgery  Procedures:   See below  Antimicrobials:  Anti-infectives (From admission, onward)   Start     Dose/Rate Route Frequency Ordered Stop   10/22/20 1900  vancomycin (VANCOREADY) IVPB 500 mg/100 mL  Status:  Discontinued        500 mg 100 mL/hr over 60 Minutes Intravenous Every 48 hours 10/20/20 2059 10/20/20 2157   10/21/20 1900  cefTRIAXone (ROCEPHIN) 2 g in sodium chloride 0.9 % 100 mL IVPB  Status:  Discontinued        2 g 200 mL/hr over 30 Minutes Intravenous Every 24 hours 10/20/20 1958 10/20/20 2212   10/21/20 1900  vancomycin (VANCOCIN) IVPB 1000 mg/200 mL premix        1,000 mg 200 mL/hr over 60 Minutes Intravenous Every 24 hours 10/20/20 2157     10/21/20 1100  ceFEPIme (MAXIPIME) 2 g in sodium chloride 0.9 % 100 mL IVPB        2 g 200 mL/hr over 30 Minutes Intravenous Every 12 hours 10/21/20 1036     10/20/20 2230  ceFEPIme (MAXIPIME) 2 g in sodium chloride 0.9 % 100 mL IVPB  Status:  Discontinued        2 g 200 mL/hr over 30 Minutes Intravenous Every 24 hours 10/20/20 2200 10/21/20 1036   10/20/20 1745  vancomycin (VANCOCIN) IVPB 1000 mg/200 mL premix        1,000 mg 200 mL/hr over 60 Minutes Intravenous  Once 10/20/20 1734 10/20/20 2016   10/20/20 1745  cefTRIAXone (ROCEPHIN) 2 g in sodium chloride 0.9 % 100 mL IVPB        2 g 200 mL/hr over 30 Minutes Intravenous  Once 10/20/20 1734 10/20/20 1934       Subjective: Patient seen and evaluated today with no acute concerns noted since admission.  She is complaining of some pain to her back for which Toradol has been  ordered.  Objective: Vitals:   10/21/20 0730 10/21/20 0800 10/21/20 1000 10/21/20 1130  BP: 117/75 115/67 (!) 95/54 117/68  Pulse:   97   Resp:    15  Temp:      TempSrc:      SpO2:   98%   Weight:      Height:        Intake/Output Summary (Last 24 hours) at 10/21/2020 1221 Last data filed at 10/21/2020 1132 Gross per 24 hour  Intake 4916.88 ml  Output --  Net 4916.88 ml   Filed Weights   10/20/20 1721  Weight: 46.3 kg    Examination:  General exam: Appears calm and comfortable  Respiratory system: Clear to auscultation. Respiratory effort normal.  Currently on nasal cannula Cardiovascular system: S1 & S2 heard, RRR Gastrointestinal system: Abdomen is nondistended,  soft and nontender. Central nervous system: Alert and awake Extremities: Bilateral AKA Skin: No rashes, lesions or ulcers    Psychiatry: Flat affect    Data Reviewed: I have personally reviewed following labs and imaging studies  CBC: Recent Labs  Lab 10/20/20 1758 10/21/20 0423  WBC 10.3 11.9*  NEUTROABS 7.1  --   HGB 9.7* 10.2*  HCT 30.8* 32.0*  MCV 94.2 90.9  PLT 365 242   Basic Metabolic Panel: Recent Labs  Lab 10/20/20 1758 10/21/20 0423  NA 140 136  K 3.5 3.2*  CL 107 104  CO2 25 26  GLUCOSE 60* 149*  BUN 15 10  CREATININE 0.30* 0.31*  CALCIUM 7.7* 8.1*  MG  --  1.0*  PHOS  --  1.9*   GFR: Estimated Creatinine Clearance: 16.3 mL/min (A) (by C-G formula based on SCr of 0.31 mg/dL (L)). Liver Function Tests: Recent Labs  Lab 10/20/20 1758 10/21/20 0423  AST 32 29  ALT 21 22  ALKPHOS 195* 209*  BILITOT 0.4 0.5  PROT 5.9* 6.2*  ALBUMIN 2.0* 2.1*   No results for input(s): LIPASE, AMYLASE in the last 168 hours. No results for input(s): AMMONIA in the last 168 hours. Coagulation Profile: Recent Labs  Lab 10/20/20 1758 10/21/20 0423  INR 1.2 1.2   Cardiac Enzymes: No results for input(s): CKTOTAL, CKMB, CKMBINDEX, TROPONINI in the last 168 hours. BNP (last 3  results) No results for input(s): PROBNP in the last 8760 hours. HbA1C: No results for input(s): HGBA1C in the last 72 hours. CBG: Recent Labs  Lab 10/20/20 2155 10/20/20 2344 10/21/20 0350 10/21/20 0740 10/21/20 1141  GLUCAP 156* 77 165* 97 207*   Lipid Profile: No results for input(s): CHOL, HDL, LDLCALC, TRIG, CHOLHDL, LDLDIRECT in the last 72 hours. Thyroid Function Tests: No results for input(s): TSH, T4TOTAL, FREET4, T3FREE, THYROIDAB in the last 72 hours. Anemia Panel: No results for input(s): VITAMINB12, FOLATE, FERRITIN, TIBC, IRON, RETICCTPCT in the last 72 hours. Sepsis Labs: Recent Labs  Lab 10/20/20 1758 10/20/20 1952  LATICACIDVEN 2.4* 1.7    Recent Results (from the past 240 hour(s))  Blood Culture (routine x 2)     Status: None (Preliminary result)   Collection Time: 10/20/20  6:00 PM   Specimen: Right Antecubital; Blood  Result Value Ref Range Status   Specimen Description RIGHT ANTECUBITAL  Final   Special Requests   Final    BOTTLES DRAWN AEROBIC ONLY Blood Culture adequate volume   Culture   Final    NO GROWTH < 12 HOURS Performed at [facility], [address], [place], [place] [address]    Report Status PENDING  Incomplete  Blood Culture (routine x 2)     Status: None (Preliminary result)   Collection Time: 10/20/20  6:08 PM   Specimen: BLOOD RIGHT WRIST  Result Value Ref Range Status   Specimen Description BLOOD RIGHT WRIST  Final   Special Requests   Final    BOTTLES DRAWN AEROBIC AND ANAEROBIC Blood Culture adequate volume   Culture   Final    NO GROWTH < 12 HOURS Performed at [facility], [address] [place], [place] [address]    Report Status PENDING  Incomplete  Respiratory Panel by RT PCR (Flu A&B, Covid) - Nasopharyngeal Swab     Status: None   Collection Time: 10/20/20  7:20 PM   Specimen: Nasopharyngeal Swab  Result Value Ref Range Status   SARS Coronavirus 2 by RT PCR NEGATIVE NEGATIVE Final  Comment: (NOTE) SARS-CoV-2  target nucleic acids are NOT DETECTED.  The SARS-CoV-2 RNA is generally detectable in upper respiratoy specimens during the acute phase of infection. The lowest concentration of SARS-CoV-2 viral copies this assay can detect is 131 copies/mL. A negative result does not preclude SARS-Cov-2 infection and should not be used as the sole basis for treatment or other patient management decisions. A negative result may occur with  improper specimen collection/handling, submission of specimen other than nasopharyngeal swab, presence of viral mutation(s) within the areas targeted by this assay, and inadequate number of viral copies (<131 copies/mL). A negative result must be combined with clinical observations, patient history, and epidemiological information. The expected result is Negative.  Fact Sheet for Patients:  PinkCheek.be  Fact Sheet for Healthcare Providers:  GravelBags.it  This test is no t yet approved or cleared by the [place] FDA and  has been authorized for detection and/or diagnosis of SARS-CoV-2 by FDA under an Emergency Use Authorization (EUA). This EUA will remain  in effect (meaning this test can be used) for the duration of the COVID-19 declaration under Section 564(b)(1) of the Act, 21 U.S.C. section 360bbb-3(b)(1), unless the authorization is terminated or revoked sooner.     Influenza A by PCR NEGATIVE NEGATIVE Final   Influenza B by PCR NEGATIVE NEGATIVE Final    Comment: (NOTE) The Xpert Xpress SARS-CoV-2/FLU/RSV assay is intended as an aid in  the diagnosis of influenza from Nasopharyngeal swab specimens and  should not be used as a sole basis for treatment. Nasal washings and  aspirates are unacceptable for Xpert Xpress SARS-CoV-2/FLU/RSV  testing.  Fact Sheet for Patients: PinkCheek.be  Fact Sheet for Healthcare  Providers: GravelBags.it  This test is not yet approved or cleared by the [place] FDA and  has been authorized for detection and/or diagnosis of SARS-CoV-2 by  FDA under an Emergency Use Authorization (EUA). This EUA will remain  in effect (meaning this test can be used) for the duration of the  Covid-19 declaration under Section 564(b)(1) of the Act, 21  U.S.C. section 360bbb-3(b)(1), unless the authorization is  terminated or revoked. Performed at [facility], [address] [place], [place] [address]          Radiology Studies: [facility] Chest [facility] 1 View  Result Date: 10/20/2020 CLINICAL DATA:  Wound infection EXAM: PORTABLE CHEST 1 VIEW COMPARISON:  07/05/2020 FINDINGS: The heart size and mediastinal contours are within normal limits. Both lungs are clear. Aortic atherosclerosis. IMPRESSION: No active disease. Electronically Signed   By: Donavan Foil M.D.   On: 10/20/2020 17:52        Scheduled Meds: . atorvastatin  80 mg Oral Daily  . insulin aspart  0-5 Units Subcutaneous QHS  . insulin aspart  0-9 Units Subcutaneous TID WC  . pantoprazole  40 mg Oral Daily  . potassium chloride  40 mEq Oral Once   Continuous Infusions: . ceFEPime (MAXIPIME) IV 2 g (10/21/20 1143)  . vancomycin       LOS: 1 day    Time spent: 35 minutes    Corinthian Kemler Darleen Crocker, DO [facility]  If 7PM-7AM, please contact night-coverage [email or web address] 10/21/2020, 12:21 PM

## 2020-10-21 NOTE — Consult Note (Signed)
WOC Nurse Consult Note: Reason for Consult: Unstageable Sacral Pressure injury Wound type:Pressure Pressure Injury POA: Yes Measurement: To be taken and documented by bedside RN today, E.Gantt. Wound bed: red with large amount necrotic tissue. See photo in EMR taken upon admission. Loose hanging eschar noted. Drainage (amount, consistency, odor)  Periwound: intact Dressing procedure/placement/frequency: I have provided Nursing with topical care orders for collagenase [facility]) topped with saline moistened gauze, ABD and secure with paper tape applied daily. A mattress replacement with low air loss feature and Pressure redistribution heel boots have been ordered.  I have communicated via Secure Chat with Dr. Tobe Sos and recommended a consult to General Surgery be considered for possible debridement of this PI while in house. He will order/arrange if he agrees.  [place] nursing team will not follow, but will remain available to this patient, the nursing and medical teams.  Please re-consult if needed. Thanks, Maudie Flakes, MSN, RN, [place], Arther Abbott  Pager# [phone]

## 2020-10-21 NOTE — Consult Note (Signed)
Subjective:   CC: sacral ulcer  HPI:  [name] is a 54 y.o. female who was consulted by Manuella Ghazi for evaluation of above.  Pt is NH pt with hx of sacral ulcers in the past.  She states the area started increasing in pain a sometime ago.  Comes to ED after her daughter concerned that the wound may be infected and needed higher level of care than at the nursing home she resides in.  It did start her on some IV antibiotics after placing a PICC line for this.     Past Medical History:  has a past medical history of Anemia, Anxiety, Asthma, Cigarette smoker (02/25/2014), COPD (chronic obstructive pulmonary disease) ([facility]), Diabetes mellitus without complication ([place]), GERD (gastroesophageal reflux disease), ischemic right MCA stroke (02/25/2014), Hypertension, MI (myocardial infarction) ([place]), Obesity, unspecified (02/25/2014), and Stroke ([place]).  Past Surgical History:  has a past surgical history that includes Above knee leg amputaton (Bilateral) and Angioplasty.  Family History: family history includes Vascular Disease in an other family member.  Social History:  reports that she has quit smoking. Her smoking use included cigarettes. She smoked 0.50 packs per day. She has never used smokeless tobacco. She reports that she does not drink alcohol and does not use drugs.  Current Medications:  Prior to Admission medications   Medication Sig Start Date End Date Taking? Authorizing Provider  acetaminophen (TYLENOL) 500 MG tablet Take 1,000 mg by mouth 2 (two) times daily.   Yes [provider]  ALPRAZolam Duanne Moron) 0.5 MG tablet Take 0.5 mg by mouth 2 (two) times daily.    Yes [provider]  ARIPiprazole (ABILIFY) 5 MG tablet Take 5 mg by mouth daily. 12/13/19  Yes [provider]  aspirin EC 81 MG tablet Take 81 mg by mouth daily. Swallow whole.   Yes [provider]  atorvastatin (LIPITOR) 80 MG tablet Take 80 mg by mouth daily.   Yes [provider]   Baclofen 5 MG TABS Take 1 tablet by mouth 3 (three) times daily.    Yes [provider]  calcium carbonate (TUMS - DOSED IN MG ELEMENTAL CALCIUM) 500 MG chewable tablet Chew 2 tablets by mouth at bedtime.   Yes [provider]  clopidogrel (PLAVIX) 75 MG tablet Take 75 mg by mouth daily. 11/26/19  Yes [provider]  Cranberry 450 MG CAPS Take 2 tablets by mouth 2 (two) times daily.   Yes [provider]  digoxin (LANOXIN) 0.25 MG tablet Take 250 mcg by mouth daily.    Yes [provider]  divalproex (DEPAKOTE) 125 MG DR tablet Take 250 mg by mouth 2 (two) times daily.  12/19/19  Yes [provider]  DULoxetine (CYMBALTA) 60 MG capsule Take 60 mg by mouth 2 (two) times daily.   Yes [provider]  fenofibrate 160 MG tablet Take 160 mg by mouth daily. 11/09/19  Yes [provider]  furosemide (LASIX) 20 MG tablet Take 1 tablet (20 mg total) by mouth daily. 07/08/20 11/05/20 Yes Barton Dubois, MD  insulin glargine (LANTUS) 100 UNIT/ML injection Inject 40 Units into the skin daily.    Yes [provider]  insulin lispro (HUMALOG) 100 UNIT/ML injection Inject 2-15 Units into the skin in the morning, at noon, in the evening, and at bedtime. Before meals and at bedtime. Per sliding scale   Yes [provider]  lidocaine (LIDODERM) 5 % Place 3 patches onto the skin daily. Remove & Discard patch within 12  hours to lower back and each knee stump. (1 patch for each body part)   Yes [provider]  melatonin 3 MG TABS tablet Take 3 mg by mouth at bedtime.   Yes [provider]  methenamine (HIPREX) 1 g tablet Take 1 g by mouth daily.   Yes [provider]  metoprolol succinate (TOPROL-XL) 25 MG 24 hr tablet Take 1 tablet (25 mg total) by mouth 2 (two) times daily. 07/06/20  Yes Barton Dubois, MD  Multiple Vitamin (MULTIVITAMIN) tablet Take 1 tablet by mouth daily.   Yes [provider]   nitroGLYCERIN (NITROSTAT) 0.4 MG SL tablet Place 0.4 mg under the tongue every 5 (five) minutes as needed for chest pain.   Yes [provider]  pantoprazole (PROTONIX) 20 MG tablet Take 1 tablet (20 mg total) by mouth 2 (two) times daily. 07/06/20  Yes Barton Dubois, MD  polyethylene glycol (MIRALAX / GLYCOLAX) 17 g packet Take 17 g by mouth daily as needed.    Yes [provider]  Potassium Chloride ER 20 MEQ TBCR Take 2 tablets by mouth daily.   Yes [provider]  QUEtiapine (SEROQUEL) 25 MG tablet Take 25 mg by mouth 2 (two) times daily.   Yes [provider]  Semaglutide, 1 MG/DOSE, (OZEMPIC, 1 MG/DOSE,) 2 MG/1.5ML SOPN Inject 1 mg into the skin once a week. On fridays 12/10/19  Yes [provider]  traMADol (ULTRAM) 50 MG tablet Take 100 mg by mouth every 8 (eight) hours.   Yes [provider]  vitamin B-12 (CYANOCOBALAMIN) 500 MCG tablet Take 1,000 mcg by mouth daily.   Yes [provider]  Ensure (ENSURE) Take 237 mLs by mouth 2 (two) times daily between meals. Patient not taking: Reported on 07/05/2020    [provider]  FLUoxetine (PROZAC) 20 MG tablet Take 20 mg by mouth daily. Patient not taking: Reported on 07/05/2020    [provider]  ondansetron (ZOFRAN) 4 MG tablet Take 1 tablet (4 mg total) by mouth every 8 (eight) hours as needed for nausea or vomiting. 03/06/20   Rolland Porter, MD  pregabalin (LYRICA) 75 MG capsule Take 75 mg by mouth 3 (three) times daily.    [provider]    Allergies:  Allergies  Allergen Reactions  . Morphine Other (See Comments)    PO form causes reaction Other reaction(s): SHORTNESS OF BREATH PO form causes reaction Other reaction(s): SHORTNESS OF BREATH   . Ace Inhibitors   . Flexeril [Cyclobenzaprine]   . Paxil [Paroxetine Hcl]   . Silver Sulfadiazine Dermatitis    ROS:  General: Denies weight loss, weight gain, fatigue, fevers, chills, and night  sweats. Eyes: Denies blurry vision, double vision, eye pain, itchy eyes, and tearing. Ears: Denies hearing loss, earache, and ringing in ears. Nose: Denies sinus pain, congestion, infections, runny nose, and nosebleeds. Mouth/throat: Denies hoarseness, sore throat, bleeding gums, and difficulty swallowing. Heart: Denies chest pain, palpitations, racing heart, irregular heartbeat, leg pain or swelling, and decreased activity tolerance. Respiratory: Denies breathing difficulty, shortness of breath, wheezing, cough, and sputum. GI: Denies change in appetite, heartburn, nausea, vomiting, constipation, diarrhea, and blood in stool. GU: Denies difficulty urinating, pain with urinating, urgency, frequency, blood in urine. Musculoskeletal: Denies joint stiffness, pain, swelling, muscle weakness. Skin: Denies rash, itching, mass, tumors, sores, and boils Neurologic: Denies headache, fainting, dizziness, seizures, numbness, and tingling. Psychiatric: Denies depression, anxiety, difficulty sleeping, and memory loss. Endocrine: Denies heat or cold intolerance, and increased  thirst or urination. Blood/lymph: Denies easy bruising, easy bruising, and swollen glands     Objective:     BP (!) 95/54   Pulse 97   Temp 98.1 F (36.7 C) (Oral)   Resp 18   Ht 3' (0.914 m) Comment: [id number] , bilateral AKA  Wt 46.3 kg   SpO2 98%   BMI 55.33 kg/m   Constitutional :  alert, cooperative, appears stated age and no distress  Lymphatics/Throat:  no asymmetry, masses, or scars  Respiratory:  clear to auscultation bilaterally  Cardiovascular:  regular rate and rhythm  Gastrointestinal: soft, non-tender; bowel sounds normal; no masses,  no organomegaly.  Musculoskeletal: Steady gait and movement  Skin: Cool and moist, sacral wound with some soft eschar towards the left lateral aspect.  No obvious purulent drainage expressed with pressure.  Minimal erythema at the wound edges.  Tender to palpation.  See picture  below.  Psychiatric: Normal affect, non-agitated, not confused            LABS:  CMP Latest Ref Rng & Units 10/21/2020 10/20/2020 07/06/2020  Glucose 70 - 99 mg/dL 149(H) 60(L) 173(H)  BUN 6 - 20 mg/dL 10 15 16   Creatinine 0.44 - 1.00 mg/dL 0.31(L) 0.30(L) 0.43(L)  Sodium 135 - 145 mmol/L 136 140 136  Potassium 3.5 - 5.1 mmol/L 3.2(L) 3.5 2.9(L)  Chloride 98 - 111 mmol/L 104 107 94(L)  CO2 22 - 32 mmol/L 26 25 28   Calcium 8.9 - 10.3 mg/dL 8.1(L) 7.7(L) 8.6(L)  Total Protein 6.5 - 8.1 g/dL 6.2(L) 5.9(L) 6.8  Total Bilirubin 0.3 - 1.2 mg/dL 0.5 0.4 1.0  Alkaline Phos 38 - 126 U/L 209(H) 195(H) 103  AST 15 - 41 U/L 29 32 30  ALT 0 - 44 U/L 22 21 21    CBC Latest Ref Rng & Units 10/21/2020 10/20/2020 07/06/2020  WBC 4.0 - 10.5 K/uL 11.9(H) 10.3 20.1(H)  Hemoglobin 12.0 - 15.0 g/dL 10.2(L) 9.7(L) 11.5(L)  Hematocrit 36 - 46 % 32.0(L) 30.8(L) 36.1  Platelets 150 - 400 K/uL 377 365 508(H)    RADS: n/a  Assessment:      Sacral ulcer.  Unstageable at this point.  Plan:     1.  Will likely benefit from surgical debridement.  We will wait a couple days for aspirin to wear off prior to scheduling to minimize bleeding issues.  Bedside debridement not an option due to the tenderness to palpation during exam today, along with the aspirin use.  Alternatives include continued observation and supportive care with antibiotics.  Benefits include possible symptom relief, pathologic evaluation.  The patient verbalized understanding and all questions were answered to the patient's satisfaction.  Supportive care per hospitalist team in the meantime.

## 2020-10-21 NOTE — Plan of Care (Signed)

## 2020-10-22 DIAGNOSIS — L89159 Pressure ulcer of sacral region, unspecified stage: Secondary | ICD-10-CM | POA: Diagnosis not present

## 2020-10-22 LAB — BASIC METABOLIC PANEL
Anion gap: 10 (ref 5–15)
BUN: 11 mg/dL (ref 6–20)
CO2: 25 mmol/L (ref 22–32)
Calcium: 8.6 mg/dL — ABNORMAL LOW (ref 8.9–10.3)
Chloride: 101 mmol/L (ref 98–111)
Creatinine, Ser: 0.31 mg/dL — ABNORMAL LOW (ref 0.44–1.00)
GFR, Estimated: >60 mL/min (ref >60)
Glucose, Bld: 118 mg/dL — ABNORMAL HIGH (ref 70–99)
Potassium: 4 mmol/L (ref 3.5–5.1)
Sodium: 136 mmol/L (ref 135–145)

## 2020-10-22 LAB — CBC
HCT: 35.4 % — ABNORMAL LOW (ref 36.0–46.0)
Hemoglobin: 11.1 g/dL — ABNORMAL LOW (ref 12.0–15.0)
MCH: 28.5 pg (ref 26.0–34.0)
MCHC: 31.4 g/dL (ref 30.0–36.0)
MCV: 91 fL (ref 80.0–100.0)
Platelets: 355 10*3/uL (ref 150–400)
RBC: 3.89 MIL/uL (ref 3.87–5.11)
RDW: 15.3 % (ref 11.5–15.5)
WBC: 7.5 10*3/uL (ref 4.0–10.5)
nRBC: 0 % (ref 0.0–0.2)

## 2020-10-22 LAB — GLUCOSE, CAPILLARY
Glucose-Capillary: 142 mg/dL — ABNORMAL HIGH (ref 70–99)
Glucose-Capillary: 181 mg/dL — ABNORMAL HIGH (ref 70–99)
Glucose-Capillary: 128 mg/dL — ABNORMAL HIGH (ref 70–99)
Glucose-Capillary: 186 mg/dL — ABNORMAL HIGH (ref 70–99)

## 2020-10-22 LAB — MAGNESIUM: Magnesium: 1.8 mg/dL (ref 1.7–2.4)

## 2020-10-22 LAB — LACTIC ACID, PLASMA: Lactic Acid, Venous: 2.2 mmol/L (ref 0.5–1.9)

## 2020-10-22 MED ORDER — DIVALPROEX SODIUM 250 MG PO DR TAB
250.0000 mg | DELAYED_RELEASE_TABLET | Freq: Two times a day (BID) | ORAL | Status: DC
  Administered 2020-10-22 – 2020-10-23 (×3): 250 mg via ORAL
  Filled 2020-10-22 (×3): qty 1

## 2020-10-22 MED ORDER — QUETIAPINE FUMARATE 25 MG PO TABS
25.0000 mg | ORAL_TABLET | Freq: Two times a day (BID) | ORAL | Status: DC
  Administered 2020-10-22 – 2020-10-23 (×3): 25 mg via ORAL
  Filled 2020-10-22 (×3): qty 1

## 2020-10-22 MED ORDER — METOPROLOL SUCCINATE ER 25 MG PO TB24
25.0000 mg | ORAL_TABLET | Freq: Two times a day (BID) | ORAL | Status: DC
  Administered 2020-10-22 – 2020-10-23 (×2): 25 mg via ORAL
  Filled 2020-10-22 (×3): qty 1

## 2020-10-22 MED ORDER — DIGOXIN 125 MCG PO TABS
250.0000 ug | ORAL_TABLET | Freq: Every day | ORAL | Status: DC
  Administered 2020-10-22 – 2020-10-23 (×2): 250 ug via ORAL
  Filled 2020-10-22 (×2): qty 2

## 2020-10-22 MED ORDER — ALPRAZOLAM 0.5 MG PO TABS
0.5000 mg | ORAL_TABLET | Freq: Two times a day (BID) | ORAL | Status: DC
  Administered 2020-10-22 – 2020-10-23 (×3): 0.5 mg via ORAL
  Filled 2020-10-22 (×3): qty 1

## 2020-10-22 NOTE — Progress Notes (Signed)
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 Daughter was at bedside and provided most of the history. Patient states that she has had decreased appetite and felt weak and has been more bedbound within last couple of weeks (usually able to self transfer from bed to wheelchair at baseline),she has history of sacral decubitus several years ago and has had struggle with healing correctly of this wound, recently she was noted to have an infection of the wound, PICC line was placed at the nursing facility and patient was started on IV antibiotic. Daughter states that she went to see her mom (patient) over the last couple of days and was concerned that she was not getting adequate care for infection, so she requested for patient to be transferred to the ED for further evaluation. On arrival of EMS, daughter states that patient's SBP was noted to be in the 70s and IV 500 cc bolus was given with no significant improvement in blood pressure. She denies fever, chills, shortness of breath, chest pain, shortness of breath, nausea, vomiting or abdominal pain. Last bowel movement was yesterday.  ED Course: In the emergency department, she was hypotensive with a BP of 79/54, but other vital signs were within normal range. Work-up in the ED showed normocytic anemia, hypoglycemia, lactic acid of 2.4, hypoalbuminemia, ALP 195. RSV for influenzaAand B as well as SARS coronavirus 2 were negative. Chest x-ray showed no active disease. She was started on vancomycin and ceftriaxone. Hospitalist was asked to  admit patient for further evaluation and management.  10/23: Patient was admitted with infected sacral decubitus ulcer with worsening pain despite initiation of IV antibiotics and her skilled nursing facility.  Continue vancomycin and Rocephin as ordered.  Seen by general surgery for consideration of debridement with plans to pursue this by 10/25 in the OR.  Resume diet for now and hold aspirin and Plavix.  10/24: Patient remains in stable condition this morning and is okay for transfer to telemetry.  Plan to keep n.p.o. after midnight in anticipation for debridement in a.m.  MRSA PCR negative and therefore vancomycin will be discontinued.  Assessment & Plan:   Active Problems:   Type 2 diabetes mellitus with hyperlipidemia (HCC)   HTN (hypertension)   Obesity   Hx of ischemic right MCA stroke, 1998   GERD (gastroesophageal reflux disease)   Hypotension   Sacral decubitus ulcer   Lactic acidosis   Hypoglycemia   Hypoalbuminemia   Elevated alpha fetoprotein   Infected, unstageable sacral ulcer -Continue on cefepime with vancomycin discontinued 10/24 -Blood cultures with no growth; aerobic culture with gram-negative rods and some few gram-positive cocci -Monitor CBC with improving leukocytosis -Appreciate general surgery consultation with plans for OR debridement by 10/25 -Hold aspirin and Plavix for now per GS  Lactic acidosis-resolved with IV hydration  Type 2 diabetes with some initial hypoglycemia-now stable -Start carb modified diet as ordered and monitor  Hypoalbuminemia secondary to moderate protein calorie malnutrition -Protein supplementation  Essential hypertension -Hold BP meds with ongoing soft blood pressure readings initially -Plan to resume lower doses today  Dyslipidemia -Continue atorvastatin  GERD -Protonix  History of CVA/CAD/PAD -Continue on statin and  hold aspirin and Plavix for now with anticipated OR debridement 10/25  DVT prophylaxis:   Holding Lovenox in anticipation for debridement in a.m. Code Status: Full code Family Communication: Discussed with daughter on phone 10/24 Disposition Plan:  Status is: Inpatient  Remains inpatient appropriate because:IV treatments appropriate due to intensity of illness or inability to take PO and Inpatient level of care appropriate due to severity of illness   Dispo: The patient is from: SNF  Anticipated d/c is to: SNF  Anticipated d/c date is: 2 days  Patient currently is not medically stable to d/c.  Patient will require ongoing IV antibiotics as ordered with plans for debridement in OR by 10/25.   Consultants:   General surgery  Procedures:   See below  Antimicrobials:  Anti-infectives (From admission, onward)   Start     Dose/Rate Route Frequency Ordered Stop   10/22/20 1900  vancomycin (VANCOREADY) IVPB 500 mg/100 mL  Status:  Discontinued        500 mg 100 mL/hr over 60 Minutes Intravenous Every 48 hours 10/20/20 2059 10/20/20 2157   10/21/20 1900  cefTRIAXone (ROCEPHIN) 2 g in sodium chloride 0.9 % 100 mL IVPB  Status:  Discontinued        2 g 200 mL/hr over 30 Minutes Intravenous Every 24 hours 10/20/20 1958 10/20/20 2212   10/21/20 1900  vancomycin (VANCOCIN) IVPB 1000 mg/200 mL premix  Status:  Discontinued        1,000 mg 200 mL/hr over 60 Minutes Intravenous Every 24 hours 10/20/20 2157 10/22/20 0739   10/21/20 1100  ceFEPIme (MAXIPIME) 2 g in sodium chloride 0.9 % 100 mL IVPB        2 g 200 mL/hr over 30 Minutes Intravenous Every 12 hours 10/21/20 1036     10/20/20 2230  ceFEPIme (MAXIPIME) 2 g in sodium chloride 0.9 % 100 mL IVPB  Status:  Discontinued        2 g 200 mL/hr over 30 Minutes Intravenous Every 24 hours 10/20/20 2200 10/21/20 1036   10/20/20 1745  vancomycin (VANCOCIN) IVPB 1000 mg/200 mL premix        1,000 mg 200 mL/hr over 60 Minutes Intravenous  Once 10/20/20 1734 10/20/20 2016   10/20/20 1745   cefTRIAXone (ROCEPHIN) 2 g in sodium chloride 0.9 % 100 mL IVPB        2 g 200 mL/hr over 30 Minutes Intravenous  Once 10/20/20 1734 10/20/20 1934       Subjective: Patient seen and evaluated today with no new acute complaints or concerns. No acute concerns or events noted overnight.  Objective: Vitals:   10/22/20 0200 10/22/20 0400 10/22/20 0600 10/22/20 0746  BP: (!) 103/59 107/67 (!) 159/98   Pulse: 77 86 87 (!) 111  Resp: 16 16 (!) 25 17  Temp:  98.4 F (36.9 C)  98.8 F (37.1 C)  TempSrc:  Oral  Oral  SpO2: 97% 99% 99% 100%  Weight:      Height:        Intake/Output Summary (Last 24 hours) at 10/22/2020 0907 Last data filed at 10/22/2020 0400 Gross per 24 hour  Intake 1049.9 ml  Output --  Net 1049.9 ml   Filed Weights   10/20/20 1721 10/21/20 1633  Weight: 46.3 kg 45.4 kg    Examination:  General exam: Appears calm and comfortable  Respiratory system: Clear to auscultation. Respiratory effort normal. Cardiovascular system: S1 & S2 heard, RRR.  Gastrointestinal system: Abdomen is nondistended, soft  and nontender.  Central nervous system: Alert and oriented. No focal neurological deficits. Extremities: Bilateral amputations Skin: Sacral decubitus as noted previously Psychiatry: Judgement and insight appear normal. Mood & affect appropriate.     Data Reviewed: I have personally reviewed following labs and imaging studies  CBC: Recent Labs  Lab 10/20/20 1758 10/21/20 0423 10/22/20 0340  WBC 10.3 11.9* 7.5  NEUTROABS 7.1  --   --   HGB 9.7* 10.2* 11.1*  HCT 30.8* 32.0* 35.4*  MCV 94.2 90.9 91.0  PLT 365 377 209   Basic Metabolic Panel: Recent Labs  Lab 10/20/20 1758 10/21/20 0423 10/22/20 0340  NA 140 136 136  K 3.5 3.2* 4.0  CL 107 104 101  CO2 25 26 25   GLUCOSE 60* 149* 118*  BUN 15 10 11   CREATININE 0.30* 0.31* 0.31*  CALCIUM 7.7* 8.1* 8.6*  MG  --  1.0* 1.8  PHOS  --  1.9*  --    GFR: Estimated Creatinine Clearance: 58.3 mL/min  (A) (by C-G formula based on SCr of 0.31 mg/dL (L)). Liver Function Tests: Recent Labs  Lab 10/20/20 1758 10/21/20 0423  AST 32 29  ALT 21 22  ALKPHOS 195* 209*  BILITOT 0.4 0.5  PROT 5.9* 6.2*  ALBUMIN 2.0* 2.1*   No results for input(s): LIPASE, AMYLASE in the last 168 hours. No results for input(s): AMMONIA in the last 168 hours. Coagulation Profile: Recent Labs  Lab 10/20/20 1758 10/21/20 0423  INR 1.2 1.2   Cardiac Enzymes: No results for input(s): CKTOTAL, CKMB, CKMBINDEX, TROPONINI in the last 168 hours. BNP (last 3 results) No results for input(s): PROBNP in the last 8760 hours. HbA1C: No results for input(s): HGBA1C in the last 72 hours. CBG: Recent Labs  Lab 10/21/20 0740 10/21/20 1141 10/21/20 1651 10/21/20 2100 10/22/20 0747  GLUCAP 97 207* 118* 125* 142*   Lipid Profile: No results for input(s): CHOL, HDL, LDLCALC, TRIG, CHOLHDL, LDLDIRECT in the last 72 hours. Thyroid Function Tests: No results for input(s): TSH, T4TOTAL, FREET4, T3FREE, THYROIDAB in the last 72 hours. Anemia Panel: No results for input(s): VITAMINB12, FOLATE, FERRITIN, TIBC, IRON, RETICCTPCT in the last 72 hours. Sepsis Labs: Recent Labs  Lab 10/20/20 1758 10/20/20 1952  LATICACIDVEN 2.4* 1.7    Recent Results (from the past 240 hour(s))  Blood Culture (routine x 2)     Status: None (Preliminary result)   Collection Time: 10/20/20  6:00 PM   Specimen: Right Antecubital; Blood  Result Value Ref Range Status   Specimen Description RIGHT ANTECUBITAL  Final   Special Requests   Final    BOTTLES DRAWN AEROBIC ONLY Blood Culture adequate volume   Culture   Final    NO GROWTH < 12 HOURS Performed at [facility], [address] [place], [place] [address]    Report Status PENDING  Incomplete  Blood Culture (routine x 2)     Status: None (Preliminary result)   Collection Time: 10/20/20  6:08 PM   Specimen: BLOOD RIGHT WRIST  Result Value Ref Range Status   Specimen  Description BLOOD RIGHT WRIST  Final   Special Requests   Final    BOTTLES DRAWN AEROBIC AND ANAEROBIC Blood Culture adequate volume   Culture   Final    NO GROWTH < 12 HOURS Performed at [facility], [address], [place], [place] [address]    Report Status PENDING  Incomplete  Respiratory Panel by RT PCR (Flu A&B, Covid) - Nasopharyngeal Swab  Status: None   Collection Time: 10/20/20  7:20 PM   Specimen: Nasopharyngeal Swab  Result Value Ref Range Status   SARS Coronavirus 2 by RT PCR NEGATIVE NEGATIVE Final    Comment: (NOTE) SARS-CoV-2 target nucleic acids are NOT DETECTED.  The SARS-CoV-2 RNA is generally detectable in upper respiratoy specimens during the acute phase of infection. The lowest concentration of SARS-CoV-2 viral copies this assay can detect is 131 copies/mL. A negative result does not preclude SARS-Cov-2 infection and should not be used as the sole basis for treatment or other patient management decisions. A negative result may occur with  improper specimen collection/handling, submission of specimen other than nasopharyngeal swab, presence of viral mutation(s) within the areas targeted by this assay, and inadequate number of viral copies (<131 copies/mL). A negative result must be combined with clinical observations, patient history, and epidemiological information. The expected result is Negative.  Fact Sheet for Patients:  PinkCheek.be  Fact Sheet for Healthcare Providers:  GravelBags.it  This test is no t yet approved or cleared by the [place] FDA and  has been authorized for detection and/or diagnosis of SARS-CoV-2 by FDA under an Emergency Use Authorization (EUA). This EUA will remain  in effect (meaning this test can be used) for the duration of the COVID-19 declaration under Section 564(b)(1) of the Act, 21 U.S.C. section 360bbb-3(b)(1), unless the authorization is terminated  or revoked sooner.     Influenza A by PCR NEGATIVE NEGATIVE Final   Influenza B by PCR NEGATIVE NEGATIVE Final    Comment: (NOTE) The Xpert Xpress SARS-CoV-2/FLU/RSV assay is intended as an aid in  the diagnosis of influenza from Nasopharyngeal swab specimens and  should not be used as a sole basis for treatment. Nasal washings and  aspirates are unacceptable for Xpert Xpress SARS-CoV-2/FLU/RSV  testing.  Fact Sheet for Patients: PinkCheek.be  Fact Sheet for Healthcare Providers: GravelBags.it  This test is not yet approved or cleared by the [place] FDA and  has been authorized for detection and/or diagnosis of SARS-CoV-2 by  FDA under an Emergency Use Authorization (EUA). This EUA will remain  in effect (meaning this test can be used) for the duration of the  Covid-19 declaration under Section 564(b)(1) of the Act, 21  U.S.C. section 360bbb-3(b)(1), unless the authorization is  terminated or revoked. Performed at [facility], [address], [place], [place] [address]   Wound or Superficial Culture     Status: None (Preliminary result)   Collection Time: 10/20/20  9:40 PM   Specimen: Decubitis; Wound  Result Value Ref Range Status   Specimen Description   Final    DECUBITIS Performed at [facility], [address], [place], [place] [address]    Special Requests   Final    Normal Performed at [facility], [address] [place], [place] [address]    Gram Stain   Final    RARE WBC PRESENT, PREDOMINANTLY PMN MODERATE GRAM NEGATIVE RODS FEW GRAM POSITIVE COCCI IN PAIRS IN CLUSTERS Performed at [facility], [place] [address], [place], [place] [address]    Culture PENDING  Incomplete   Report Status PENDING  Incomplete  MRSA PCR Screening     Status: None   Collection Time: 10/21/20  3:47 PM   Specimen: Nasal Mucosa; Nasopharyngeal  Result Value Ref Range Status   MRSA by PCR NEGATIVE NEGATIVE Final     Comment:        The GeneXpert MRSA Assay (FDA approved for  NASAL specimens only), is one component of a comprehensive MRSA colonization surveillance program. It is not intended to diagnose MRSA infection nor to guide or monitor treatment for MRSA infections. Performed at [facility], [address], [place], [place] [address]          Radiology Studies: [facility] Chest [facility] 1 View  Result Date: 10/20/2020 CLINICAL DATA:  Wound infection EXAM: PORTABLE CHEST 1 VIEW COMPARISON:  07/05/2020 FINDINGS: The heart size and mediastinal contours are within normal limits. Both lungs are clear. Aortic atherosclerosis. IMPRESSION: No active disease. Electronically Signed   By: Donavan Foil M.D.   On: 10/20/2020 17:52        Scheduled Meds: . atorvastatin  80 mg Oral Daily  . Chlorhexidine Gluconate Cloth  6 each Topical Daily  . collagenase   Topical Daily  . influenza vac split quadrivalent PF  0.5 mL Intramuscular Tomorrow-1000  . insulin aspart  0-5 Units Subcutaneous QHS  . insulin aspart  0-9 Units Subcutaneous TID WC  . pantoprazole  40 mg Oral Daily  . pneumococcal 23 valent vaccine  0.5 mL Intramuscular Tomorrow-1000  . traZODone  50 mg Oral Once   Continuous Infusions: . ceFEPime (MAXIPIME) IV 2 g (10/21/20 2118)     LOS: 2 days    Time spent: 30 minutes    Jalaiya Oyster Darleen Crocker, DO [facility]  If 7PM-7AM, please contact night-coverage [email or web address] 10/22/2020, 9:07 AM

## 2020-10-23 DIAGNOSIS — L89159 Pressure ulcer of sacral region, unspecified stage: Secondary | ICD-10-CM | POA: Diagnosis not present

## 2020-10-23 DIAGNOSIS — L89154 Pressure ulcer of sacral region, stage 4: Secondary | ICD-10-CM

## 2020-10-23 LAB — CBC
HCT: 33.8 % — ABNORMAL LOW (ref 36.0–46.0)
Hemoglobin: 10.6 g/dL — ABNORMAL LOW (ref 12.0–15.0)
MCH: 29.2 pg (ref 26.0–34.0)
MCHC: 31.4 g/dL (ref 30.0–36.0)
MCV: 93.1 fL (ref 80.0–100.0)
Platelets: 370 10*3/uL (ref 150–400)
RBC: 3.63 MIL/uL — ABNORMAL LOW (ref 3.87–5.11)
RDW: 15.4 % (ref 11.5–15.5)
WBC: 8.8 10*3/uL (ref 4.0–10.5)
nRBC: 0 % (ref 0.0–0.2)

## 2020-10-23 LAB — GLUCOSE, CAPILLARY
Glucose-Capillary: 178 mg/dL — ABNORMAL HIGH (ref 70–99)
Glucose-Capillary: 204 mg/dL — ABNORMAL HIGH (ref 70–99)
Glucose-Capillary: 135 mg/dL — ABNORMAL HIGH (ref 70–99)
Glucose-Capillary: 161 mg/dL — ABNORMAL HIGH (ref 70–99)

## 2020-10-23 LAB — BASIC METABOLIC PANEL
Anion gap: 9 (ref 5–15)
BUN: 9 mg/dL (ref 6–20)
CO2: 25 mmol/L (ref 22–32)
Calcium: 8.7 mg/dL — ABNORMAL LOW (ref 8.9–10.3)
Chloride: 101 mmol/L (ref 98–111)
Creatinine, Ser: 0.41 mg/dL — ABNORMAL LOW (ref 0.44–1.00)
GFR, Estimated: >60 mL/min (ref >60)
Glucose, Bld: 196 mg/dL — ABNORMAL HIGH (ref 70–99)
Potassium: 3.5 mmol/L (ref 3.5–5.1)
Sodium: 135 mmol/L (ref 135–145)

## 2020-10-23 LAB — URINE CULTURE: Culture: NO GROWTH

## 2020-10-23 LAB — HEMOGLOBIN A1C
Hgb A1c MFr Bld: 5.7 % — ABNORMAL HIGH (ref 4.8–5.6)
Mean Plasma Glucose: 117 mg/dL

## 2020-10-23 MED ORDER — CEFEPIME IV (FOR PTA / DISCHARGE USE ONLY): 2.0000 g | Freq: Two times a day (BID) | INTRAVENOUS | 0 refills | Status: AC

## 2020-10-23 MED ORDER — INSULIN GLARGINE 100 UNIT/ML ~~LOC~~ SOLN: 20.0000 [IU] | Freq: Every day | SUBCUTANEOUS | 11 refills | Status: AC

## 2020-10-23 MED ORDER — COLLAGENASE 250 UNIT/GM EX OINT
TOPICAL_OINTMENT | Freq: Two times a day (BID) | CUTANEOUS | Status: DC
  Filled 2020-10-23: qty 30

## 2020-10-23 MED ORDER — TRAMADOL HCL 50 MG PO TABS: 100.0000 mg | ORAL_TABLET | Freq: Three times a day (TID) | ORAL | 0 refills | Status: AC

## 2020-10-23 MED ORDER — COLLAGENASE 250 UNIT/GM EX OINT: TOPICAL_OINTMENT | Freq: Two times a day (BID) | CUTANEOUS | 0 refills | Status: AC

## 2020-10-23 MED ORDER — ACETAMINOPHEN 325 MG PO TABS
650.0000 mg | ORAL_TABLET | Freq: Four times a day (QID) | ORAL | Status: DC | PRN
  Administered 2020-10-23: 650 mg via ORAL
  Filled 2020-10-23: qty 2

## 2020-10-23 MED ORDER — ALPRAZOLAM 0.5 MG PO TABS: 0.5000 mg | ORAL_TABLET | Freq: Two times a day (BID) | ORAL | 0 refills | Status: AC

## 2020-10-23 NOTE — Discharge Summary (Signed)
Physician Discharge Summary  [name] ZTA:[id number] DOB: 1966/07/23 DOA: 10/20/2020  PCP: Traci Renshaw, MD  Admit date: 10/20/2020  Discharge date: 10/23/2020  Admitted From:SNF  Disposition:  SNF  Recommendations for Outpatient Follow-up:  1. Follow up with PCP in 1-2 weeks 2. Continue on IV cefepime as prescribed to complete total 14-day course of treatment for infected sacral decubitus ulcer.  No growth on blood cultures noted. 3. No need for debridement as determined by general surgery.  Plan to continue Santyl twice daily with daily dressing changes. 4. Plan for outpatient palliative/hospice follow-up to determine goals of care 5. Continue other home medications as prior and as listed below.  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 Daughter was at bedside and provided most of the history. Patient states that she has had decreased appetite and felt weak and has been more bedbound within last couple of weeks (usually able to self transfer from bed to wheelchair at baseline),she has history of sacral decubitus several years ago and has had struggle with healing correctly of this wound, recently she was noted to have an infection of the wound, PICC line was placed at the nursing facility and patient was started on IV antibiotic. Daughter states that she went to see her mom (patient) over the last couple of days and was concerned that she was not getting adequate care for infection, so she requested for patient to be  transferred to the ED for further evaluation. On arrival of EMS, daughter states that patient's SBP was noted to be in the 70s and IV 500 cc bolus was given with no significant improvement in blood pressure. She denies fever, chills, shortness of breath, chest pain, shortness of breath, nausea, vomiting or abdominal pain. Last bowel movement was yesterday.  ED Course: In the emergency department, she was hypotensive with a BP of 79/54, but other vital signs were within normal range. Work-up in the ED showed normocytic anemia, hypoglycemia, lactic acid of 2.4, hypoalbuminemia, ALP 195. RSV for influenzaAand B as well as SARS coronavirus 2 were negative. Chest x-ray showed no active disease. She was started on vancomycin and ceftriaxone. Hospitalist was asked to admit patient for further evaluation and management.  10/23:Patient was admitted with infected sacral decubitus ulcer with worsening pain despite initiation of IV antibiotics and her skilled nursing facility. Continue vancomycin and Rocephin as ordered.Seen by general surgery for consideration of debridement with plans to pursue this by 10/25 in the OR. Resume diet for now and hold aspirin and Plavix.  10/24: Patient remains in stable condition this morning and is okay for transfer to telemetry.  Plan to keep n.p.o. after midnight in anticipation for debridement in a.m.  MRSA PCR negative and therefore vancomycin will be discontinued.  10/25: Patient was kept n.p.o. after midnight in anticipation of debridement of her sacral decubitus ulcer in the OR, however after further evaluation by general surgery this a.m. she did not appear to have any significant need for debridement at this time.  Recommendations are for Santyl to be applied twice daily with daily dressing changes and to continue IV antibiotics as noted above for total 14-day course.  She appears to be clinically doing well enough for discharge and is overall in stable condition  with no acute events noted throughout the course of this hospitalization.  Discussion was had with patient's daughter who feels that she may be clinically declining and has not been eating very much recently.  Unfortunately, it appears that given her malnourished state, her wound will not heal and will predispose her to future problems to include repeat infections.  Daughter agrees that this will likely be the case and is agreeable to having hospice/palliative evaluation in the very near future and would like for her mother to be DNR and not have any aggressive care.  Discharge Diagnoses:  Active Problems:   Type 2 diabetes mellitus with hyperlipidemia (HCC)   HTN (hypertension)   Obesity   Hx of ischemic right MCA stroke, 1998   GERD (gastroesophageal reflux disease)   Hypotension   Sacral decubitus ulcer   Lactic acidosis   Hypoglycemia   Hypoalbuminemia   Elevated alpha fetoprotein  Principal discharge diagnosis: Unstageable sacral ulcer with infection in the setting of moderate to severe protein calorie malnutrition.  Discharge Instructions  Discharge Instructions    [facility] pharmacist to adjust dose for Vancomycin, Aminoglycosides and other anti-infective therapies as requested by physician.   Complete by: As directed    [facility] to provide Cath Flo 30m   Complete by: As directed    Administer for PICC line occlusion and as ordered by physician for other access device issues.   Anaphylaxis Kit: Provided to treat any anaphylactic reaction to the medication being provided to the patient if First Dose or when requested by physician   Complete by: As directed    Epinephrine 157mml vial / amp: Administer 0.85m71m0.85ml77mubcutaneously once for moderate to severe anaphylaxis, nurse to call physician and pharmacy when reaction occurs and call 911 if needed for immediate care   Diphenhydramine 50mg585mIV vial: Administer 25-50mg 9mM PRN for first dose reaction,  rash, itching, mild reaction, nurse to call physician and pharmacy when reaction occurs   Sodium Chloride 0.9% NS 500ml I20mdminister if needed for hypovolemic blood pressure drop or as ordered by physician after call to physician with anaphylactic reaction   Change dressing on IV access line weekly and PRN   Complete by: As directed    Diet - low sodium heart healthy   Complete by: As directed    Discharge wound care:   Complete by: As directed    Daily dressing changes with santyl bid.   Flush IV access with Sodium Chloride 0.9% and Heparin 10 units/ml or 100 units/ml   Complete by: As directed    Home infusion instructions - [facility]   Complete by: As directed    Instructions: Flush IV access with Sodium Chloride 0.9% and Heparin 10units/ml or 100units/ml   Change dressing on IV access line: Weekly and PRN   Instructions Cath Flo 2mg: Ad30mister for PICC Line occlusion and as ordered by physician for other access device   [facility] pharmacist to adjust dose for: Vancomycin, Aminoglycosides and other anti-infective therapies as requested by physician   Increase activity slowly   Complete by: As directed    Method of administration may be changed at the discretion of home infusion pharmacist based upon assessment of the patient and/or caregiver's ability to self-administer the medication ordered   Complete by: As directed      Allergies as of 10/23/2020  Reactions   Morphine Other (See Comments)   PO form causes reaction Other reaction(s): SHORTNESS OF BREATH PO form causes reaction Other reaction(s): SHORTNESS OF BREATH   Ace Inhibitors    Flexeril [cyclobenzaprine]    Paxil [paroxetine Hcl]    Silver Sulfadiazine Dermatitis      Medication List    STOP taking these medications   furosemide 20 MG tablet Commonly known as: Lasix   Ozempic (1 MG/DOSE) 2 MG/1.5ML Sopn Generic drug: Semaglutide (1 MG/DOSE)   Potassium Chloride ER 20 MEQ Tbcr      TAKE these medications   acetaminophen 500 MG tablet Commonly known as: TYLENOL Take 1,000 mg by mouth 2 (two) times daily.   ALPRAZolam 0.5 MG tablet Commonly known as: XANAX Take 1 tablet (0.5 mg total) by mouth 2 (two) times daily.   ARIPiprazole 5 MG tablet Commonly known as: ABILIFY Take 5 mg by mouth daily.   aspirin EC 81 MG tablet Take 81 mg by mouth daily. Swallow whole.   atorvastatin 80 MG tablet Commonly known as: LIPITOR Take 80 mg by mouth daily.   Baclofen 5 MG Tabs Take 1 tablet by mouth 3 (three) times daily.   calcium carbonate 500 MG chewable tablet Commonly known as: TUMS - dosed in mg elemental calcium Chew 2 tablets by mouth at bedtime.   ceFEPime  IVPB Commonly known as: MAXIPIME Inject 2 g into the vein every 12 (twelve) hours for 9 days. Indication:  Sacral wound infection First Dose: No Last Day of Therapy:  11/01/2020 Labs - Once weekly:  CBC/D and BMP, Labs - Every other week:  ESR and CRP Method of administration: IV Push Method of administration may be changed at the discretion of home infusion pharmacist based upon assessment of the patient and/or caregiver's ability to self-administer the medication ordered.   clopidogrel 75 MG tablet Commonly known as: PLAVIX Take 75 mg by mouth daily.   collagenase ointment Commonly known as: SANTYL Apply topically 2 (two) times daily.   Cranberry 450 MG Caps Take 2 tablets by mouth 2 (two) times daily.   digoxin 0.25 MG tablet Commonly known as: LANOXIN Take 250 mcg by mouth daily.   divalproex 125 MG DR tablet Commonly known as: DEPAKOTE Take 250 mg by mouth 2 (two) times daily.   DULoxetine 60 MG capsule Commonly known as: CYMBALTA Take 60 mg by mouth 2 (two) times daily.   Ensure Take 237 mLs by mouth 2 (two) times daily between meals.   fenofibrate 160 MG tablet Take 160 mg by mouth daily.   FLUoxetine 20 MG tablet Commonly known as: PROZAC Take 20 mg by mouth daily.    insulin glargine 100 UNIT/ML injection Commonly known as: LANTUS Inject 0.2 mLs (20 Units total) into the skin daily. What changed: how much to take   insulin lispro 100 UNIT/ML injection Commonly known as: HUMALOG Inject 2-15 Units into the skin in the morning, at noon, in the evening, and at bedtime. Before meals and at bedtime. Per sliding scale   lidocaine 5 % Commonly known as: LIDODERM Place 3 patches onto the skin daily. Remove & Discard patch within 12 hours to lower back and each knee stump. (1 patch for each body part)   melatonin 3 MG Tabs tablet Take 3 mg by mouth at bedtime.   methenamine 1 g tablet Commonly known as: HIPREX Take 1 g by mouth daily.   metoprolol succinate 25 MG 24 hr tablet Commonly known as: TOPROL-XL Take  1 tablet (25 mg total) by mouth 2 (two) times daily.   multivitamin tablet Take 1 tablet by mouth daily.   nitroGLYCERIN 0.4 MG SL tablet Commonly known as: NITROSTAT Place 0.4 mg under the tongue every 5 (five) minutes as needed for chest pain.   ondansetron 4 MG tablet Commonly known as: ZOFRAN Take 1 tablet (4 mg total) by mouth every 8 (eight) hours as needed for nausea or vomiting.   pantoprazole 20 MG tablet Commonly known as: PROTONIX Take 1 tablet (20 mg total) by mouth 2 (two) times daily.   polyethylene glycol 17 g packet Commonly known as: MIRALAX / GLYCOLAX Take 17 g by mouth daily as needed.   pregabalin 75 MG capsule Commonly known as: LYRICA Take 75 mg by mouth 3 (three) times daily.   QUEtiapine 25 MG tablet Commonly known as: SEROQUEL Take 25 mg by mouth 2 (two) times daily.   traMADol 50 MG tablet Commonly known as: ULTRAM Take 2 tablets (100 mg total) by mouth every 8 (eight) hours.   vitamin B-12 500 MCG tablet Commonly known as: CYANOCOBALAMIN Take 1,000 mcg by mouth daily.            Discharge Care Instructions  (From admission, onward)         Start     Ordered   10/23/20 0000  Change  dressing on IV access line weekly and PRN  (Home infusion instructions - [facility] )        10/23/20 1157   10/23/20 0000  Discharge wound care:       Comments: Daily dressing changes with santyl bid.   10/23/20 1157          Contact information for follow-up providers    Traci Renshaw, MD Follow up in 2 week(s).   Specialty: Internal Medicine Contact information: [place] [phone]            Contact information for after-discharge care    [facility] .   Service: Skilled Nursing Contact information: [place] [phone]                 Allergies  Allergen Reactions  . Morphine Other (See Comments)    PO form causes reaction Other reaction(s): SHORTNESS OF BREATH PO form causes reaction Other reaction(s): SHORTNESS OF BREATH   . Ace Inhibitors   . Flexeril [Cyclobenzaprine]   . Paxil [Paroxetine Hcl]   . Silver Sulfadiazine Dermatitis    Consultations:  General surgery   Procedures/Studies: DG Chest Port 1 View  Result Date: 10/20/2020 CLINICAL DATA:  Wound infection EXAM: PORTABLE CHEST 1 VIEW COMPARISON:  07/05/2020 FINDINGS: The heart size and mediastinal contours are within normal limits. Both lungs are clear. Aortic atherosclerosis. IMPRESSION: No active disease. Electronically Signed   By: Donavan Foil M.D.   On: 10/20/2020 17:52     Discharge Exam: Vitals:   10/22/20 2040 10/23/20 0456  BP: (!) 93/58 137/76  Pulse: 87 100  Resp: 20 20  Temp: 98.6 F (37 C) 98.4 F (36.9 C)  SpO2: 100% 97%   Vitals:   10/22/20 1300 10/22/20 1431 10/22/20 2040 10/23/20 0456  BP:  (!) 86/60 (!) 93/58 137/76  Pulse: 90 85 87 100  Resp: 17 18 20 20   Temp:   98.6 F (37 C) 98.4 F (36.9 C)  TempSrc:   Oral Oral  SpO2: 97% 100% 100%  97%  Weight:      Height:        General: Pt is alert, awake, not in acute  distress Cardiovascular: RRR, S1/S2 +, no rubs, no gallops Respiratory: CTA bilaterally, no wheezing, no rhonchi Abdominal: Soft, NT, ND, bowel sounds + Extremities: Bilateral BKA Skin: Sacral decubitus ulcer is unstageable with no foul odor or significant oozing noted.    The results of significant diagnostics from this hospitalization (including imaging, microbiology, ancillary and laboratory) are listed below for reference.     Microbiology: Recent Results (from the past 240 hour(s))  Blood Culture (routine x 2)     Status: None (Preliminary result)   Collection Time: 10/20/20  6:00 PM   Specimen: Right Antecubital; Blood  Result Value Ref Range Status   Specimen Description RIGHT ANTECUBITAL  Final   Special Requests   Final    BOTTLES DRAWN AEROBIC ONLY Blood Culture adequate volume   Culture   Final    NO GROWTH 3 DAYS Performed at [facility], [address], [place], [place] [address]    Report Status PENDING  Incomplete  Blood Culture (routine x 2)     Status: None (Preliminary result)   Collection Time: 10/20/20  6:08 PM   Specimen: BLOOD RIGHT WRIST  Result Value Ref Range Status   Specimen Description BLOOD RIGHT WRIST  Final   Special Requests   Final    BOTTLES DRAWN AEROBIC AND ANAEROBIC Blood Culture adequate volume   Culture   Final    NO GROWTH 3 DAYS Performed at [facility], [address] [place], [place] [address]    Report Status PENDING  Incomplete  Respiratory Panel by RT PCR (Flu A&B, Covid) - Nasopharyngeal Swab     Status: None   Collection Time: 10/20/20  7:20 PM   Specimen: Nasopharyngeal Swab  Result Value Ref Range Status   SARS Coronavirus 2 by RT PCR NEGATIVE NEGATIVE Final    Comment: (NOTE) SARS-CoV-2 target nucleic acids are NOT DETECTED.  The SARS-CoV-2 RNA is generally detectable in upper respiratoy specimens during the acute phase of infection. The lowest concentration of SARS-CoV-2 viral copies this assay can detect is 131  copies/mL. A negative result does not preclude SARS-Cov-2 infection and should not be used as the sole basis for treatment or other patient management decisions. A negative result may occur with  improper specimen collection/handling, submission of specimen other than nasopharyngeal swab, presence of viral mutation(s) within the areas targeted by this assay, and inadequate number of viral copies (<131 copies/mL). A negative result must be combined with clinical observations, patient history, and epidemiological information. The expected result is Negative.  Fact Sheet for Patients:  PinkCheek.be  Fact Sheet for Healthcare Providers:  GravelBags.it  This test is no t yet approved or cleared by the [place] FDA and  has been authorized for detection and/or diagnosis of SARS-CoV-2 by FDA under an Emergency Use Authorization (EUA). This EUA will remain  in effect (meaning this test can be used) for the duration of the COVID-19 declaration under Section 564(b)(1) of the Act, 21 U.S.C. section 360bbb-3(b)(1), unless the authorization is terminated or revoked sooner.     Influenza A by PCR NEGATIVE NEGATIVE Final   Influenza B by PCR NEGATIVE NEGATIVE Final    Comment: (NOTE) The Xpert Xpress SARS-CoV-2/FLU/RSV assay is intended as an aid in  the diagnosis of influenza from Nasopharyngeal swab specimens and  should not be used as a sole basis for treatment.  Nasal washings and  aspirates are unacceptable for Xpert Xpress SARS-CoV-2/FLU/RSV  testing.  Fact Sheet for Patients: PinkCheek.be  Fact Sheet for Healthcare Providers: GravelBags.it  This test is not yet approved or cleared by the [place] FDA and  has been authorized for detection and/or diagnosis of SARS-CoV-2 by  FDA under an Emergency Use Authorization (EUA). This EUA will remain  in effect (meaning  this test can be used) for the duration of the  Covid-19 declaration under Section 564(b)(1) of the Act, 21  U.S.C. section 360bbb-3(b)(1), unless the authorization is  terminated or revoked. Performed at [facility], [address], [place], [place] [address]   Wound or Superficial Culture     Status: None (Preliminary result)   Collection Time: 10/20/20  9:40 PM   Specimen: Decubitis; Wound  Result Value Ref Range Status   Specimen Description   Final    DECUBITIS Performed at [facility], [address] [place], [place] [address]    Special Requests   Final    Normal Performed at [facility], [address], [place], [place] [address]    Gram Stain   Final    RARE WBC PRESENT, PREDOMINANTLY PMN MODERATE GRAM NEGATIVE RODS FEW GRAM POSITIVE COCCI IN PAIRS IN CLUSTERS    Culture   Final    ABUNDANT GRAM NEGATIVE RODS CULTURE REINCUBATED FOR BETTER GROWTH Performed at [facility], [place] [address] [place], [place] [address]    Report Status PENDING  Incomplete  Urine culture     Status: None   Collection Time: 10/21/20  9:53 AM   Specimen: Urine, Catheterized  Result Value Ref Range Status   Specimen Description   Final    URINE, CATHETERIZED Performed at [facility], [address], [place], [place] [address]    Special Requests   Final    NONE Performed at [facility], [address], [place], [place] [address]    Culture   Final    NO GROWTH Performed at [facility], [place] [address] [place], [place] [address]    Report Status 10/23/2020 FINAL  Final  MRSA PCR Screening     Status: None   Collection Time: 10/21/20  3:47 PM   Specimen: Nasal Mucosa; Nasopharyngeal  Result Value Ref Range Status   MRSA by PCR NEGATIVE NEGATIVE Final    Comment:        The GeneXpert MRSA Assay (FDA approved for NASAL specimens only), is one component of a comprehensive MRSA colonization surveillance program. It is not intended to diagnose MRSA infection nor to  guide or monitor treatment for MRSA infections. Performed at [facility], [address] [place], [place] [address]      Labs: BNP (last 3 results) No results for input(s): BNP in the last 8760 hours. Basic Metabolic Panel: Recent Labs  Lab 10/20/20 1758 10/21/20 0423 10/22/20 0340 10/23/20 0510  NA 140 136 136 135  K 3.5 3.2* 4.0 3.5  CL 107 104 101 101  CO2 25 26 25 25   GLUCOSE 60* 149* 118* 196*  BUN 15 10 11 9   CREATININE 0.30* 0.31* 0.31* 0.41*  CALCIUM 7.7* 8.1* 8.6* 8.7*  MG  --  1.0* 1.8  --   PHOS  --  1.9*  --   --    Liver Function Tests: Recent Labs  Lab 10/20/20 1758 10/21/20 0423  AST 32 29  ALT 21 22  ALKPHOS 195* 209*  BILITOT 0.4 0.5  PROT  5.9* 6.2*  ALBUMIN 2.0* 2.1*   No results for input(s): LIPASE, AMYLASE in the last 168 hours. No results for input(s): AMMONIA in the last 168 hours. CBC: Recent Labs  Lab 10/20/20 1758 10/21/20 0423 10/22/20 0340 10/23/20 0510  WBC 10.3 11.9* 7.5 8.8  NEUTROABS 7.1  --   --   --   HGB 9.7* 10.2* 11.1* 10.6*  HCT 30.8* 32.0* 35.4* 33.8*  MCV 94.2 90.9 91.0 93.1  PLT 365 377 355 370   Cardiac Enzymes: No results for input(s): CKTOTAL, CKMB, CKMBINDEX, TROPONINI in the last 168 hours. BNP: Invalid input(s): POCBNP CBG: Recent Labs  Lab 10/22/20 1726 10/22/20 2040 10/23/20 0456 10/23/20 0746 10/23/20 1100  GLUCAP 128* 186* 178* 204* 135*   D-Dimer No results for input(s): DDIMER in the last 72 hours. Hgb A1c Recent Labs    10/20/20 1807  HGBA1C 5.7*   Lipid Profile No results for input(s): CHOL, HDL, LDLCALC, TRIG, CHOLHDL, LDLDIRECT in the last 72 hours. Thyroid function studies No results for input(s): TSH, T4TOTAL, T3FREE, THYROIDAB in the last 72 hours.  Invalid input(s): FREET3 Anemia work up No results for input(s): VITAMINB12, FOLATE, FERRITIN, TIBC, IRON, RETICCTPCT in the last 72 hours. Urinalysis    Component Value Date/Time   COLORURINE YELLOW 10/21/2020 [place] 10/21/2020 0953   LABSPEC 1.013 10/21/2020 0953   PHURINE 7.0 10/21/2020 0953   GLUCOSEU NEGATIVE 10/21/2020 0953   HGBUR SMALL (A) 10/21/2020 0953   BILIRUBINUR NEGATIVE 10/21/2020 0953   KETONESUR NEGATIVE 10/21/2020 0953   PROTEINUR NEGATIVE 10/21/2020 0953   UROBILINOGEN 0.2 02/23/2014 1542   NITRITE NEGATIVE 10/21/2020 0953   LEUKOCYTESUR NEGATIVE 10/21/2020 0953   Sepsis Labs Invalid input(s): PROCALCITONIN,  WBC,  LACTICIDVEN Microbiology Recent Results (from the past 240 hour(s))  Blood Culture (routine x 2)     Status: None (Preliminary result)   Collection Time: 10/20/20  6:00 PM   Specimen: Right Antecubital; Blood  Result Value Ref Range Status   Specimen Description RIGHT ANTECUBITAL  Final   Special Requests   Final    BOTTLES DRAWN AEROBIC ONLY Blood Culture adequate volume   Culture   Final    NO GROWTH 3 DAYS Performed at [facility] [place], [address], [place], [place] [address]    Report Status PENDING  Incomplete  Blood Culture (routine x 2)     Status: None (Preliminary result)   Collection Time: 10/20/20  6:08 PM   Specimen: BLOOD RIGHT WRIST  Result Value Ref Range Status   Specimen Description BLOOD RIGHT WRIST  Final   Special Requests   Final    BOTTLES DRAWN AEROBIC AND ANAEROBIC Blood Culture adequate volume   Culture   Final    NO GROWTH 3 DAYS Performed at [facility], [address] [place], [place] [address]    Report Status PENDING  Incomplete  Respiratory Panel by RT PCR (Flu A&B, Covid) - Nasopharyngeal Swab     Status: None   Collection Time: 10/20/20  7:20 PM   Specimen: Nasopharyngeal Swab  Result Value Ref Range Status   SARS Coronavirus 2 by RT PCR NEGATIVE NEGATIVE Final    Comment: (NOTE) SARS-CoV-2 target nucleic acids are NOT DETECTED.  The SARS-CoV-2 RNA is generally detectable in upper respiratoy specimens during the acute phase of infection. The lowest concentration of SARS-CoV-2 viral copies this assay  can detect is 131 copies/mL. A negative result does not preclude SARS-Cov-2 infection and should not be used  as the sole basis for treatment or other patient management decisions. A negative result may occur with  improper specimen collection/handling, submission of specimen other than nasopharyngeal swab, presence of viral mutation(s) within the areas targeted by this assay, and inadequate number of viral copies (<131 copies/mL). A negative result must be combined with clinical observations, patient history, and epidemiological information. The expected result is Negative.  Fact Sheet for Patients:  PinkCheek.be  Fact Sheet for Healthcare Providers:  GravelBags.it  This test is no t yet approved or cleared by the [place] FDA and  has been authorized for detection and/or diagnosis of SARS-CoV-2 by FDA under an Emergency Use Authorization (EUA). This EUA will remain  in effect (meaning this test can be used) for the duration of the COVID-19 declaration under Section 564(b)(1) of the Act, 21 U.S.C. section 360bbb-3(b)(1), unless the authorization is terminated or revoked sooner.     Influenza A by PCR NEGATIVE NEGATIVE Final   Influenza B by PCR NEGATIVE NEGATIVE Final    Comment: (NOTE) The Xpert Xpress SARS-CoV-2/FLU/RSV assay is intended as an aid in  the diagnosis of influenza from Nasopharyngeal swab specimens and  should not be used as a sole basis for treatment. Nasal washings and  aspirates are unacceptable for Xpert Xpress SARS-CoV-2/FLU/RSV  testing.  Fact Sheet for Patients: PinkCheek.be  Fact Sheet for Healthcare Providers: GravelBags.it  This test is not yet approved or cleared by the [place] FDA and  has been authorized for detection and/or diagnosis of SARS-CoV-2 by  FDA under an Emergency Use Authorization (EUA). This EUA will remain   in effect (meaning this test can be used) for the duration of the  Covid-19 declaration under Section 564(b)(1) of the Act, 21  U.S.C. section 360bbb-3(b)(1), unless the authorization is  terminated or revoked. Performed at [facility], [address] [place], [place] [address]   Wound or Superficial Culture     Status: None (Preliminary result)   Collection Time: 10/20/20  9:40 PM   Specimen: Decubitis; Wound  Result Value Ref Range Status   Specimen Description   Final    DECUBITIS Performed at [facility], [address], [place], [place] [address]    Special Requests   Final    Normal Performed at [facility], [address], [place], [place] [address]    Gram Stain   Final    RARE WBC PRESENT, PREDOMINANTLY PMN MODERATE GRAM NEGATIVE RODS FEW GRAM POSITIVE COCCI IN PAIRS IN CLUSTERS    Culture   Final    ABUNDANT GRAM NEGATIVE RODS CULTURE REINCUBATED FOR BETTER GROWTH Performed at [facility], [place] [address] [place], [place] [address]    Report Status PENDING  Incomplete  Urine culture     Status: None   Collection Time: 10/21/20  9:53 AM   Specimen: Urine, Catheterized  Result Value Ref Range Status   Specimen Description   Final    URINE, CATHETERIZED Performed at [facility], [address] [place], [place] [address]    Special Requests   Final    NONE Performed at [facility], [address], [place], [place] [address]    Culture   Final    NO GROWTH Performed at [facility], [place] [address], [place],  [address]    Report Status 10/23/2020 FINAL  Final  MRSA PCR Screening     Status: None   Collection Time: 10/21/20  3:47 PM   Specimen: Nasal Mucosa; Nasopharyngeal  Result Value Ref Range Status   MRSA by PCR NEGATIVE NEGATIVE Final    Comment:        The GeneXpert MRSA Assay (FDA approved for NASAL specimens only), is one component of a comprehensive MRSA colonization surveillance program. It is not intended to diagnose  MRSA infection nor to guide or monitor treatment for MRSA infections. Performed at [facility], [address], [place], [place] [address]      Time coordinating discharge: 35 minutes  SIGNED:   Rodena Goldmann, DO [facility] 10/23/2020, 12:10 PM  If 7PM-7AM, please contact night-coverage [email or web address]

## 2020-10-23 NOTE — NC FL2 (Signed)
[place] MEDICAID FL2 LEVEL OF CARE SCREENING TOOL     IDENTIFICATION  Patient Name: [name] Birthdate: [birth year] Sex: female Admission Date (Current Location): 10/20/2020  [facility] and [place] Number:  [employer] and Address:  [place] [address], [place]      Provider Number: [id number]  Attending Physician Name and Address:  Rodena Goldmann, DO  Relative Name and Phone Number:  Barnett Abu [phone]    Current Level of Care: Hospital Recommended Level of Care: [place] Prior Approval Number:    Date Approved/Denied:   PASRR Number:    Discharge Plan: Other (Comment) Fortunato Curling for LTC)    Current Diagnoses: Patient Active Problem List   Diagnosis Date Noted  . Hypotension 10/20/2020  . Sacral decubitus ulcer 10/20/2020  . Lactic acidosis 10/20/2020  . Hypoglycemia 10/20/2020  . Hypoalbuminemia 10/20/2020  . Elevated alpha fetoprotein 10/20/2020  . Pressure injury of skin 07/06/2020  . Encephalopathy acute 07/05/2020  . History of acute anterior wall MI 10/24/2016  . 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 Chronic systolic congestive heart failure ([place]) 10/24/2016  . Cardiomyopathy, ischemic 10/24/2016  . Sinus tachycardia 10/24/2016  . Sepsis ([place]) 12/20/2015  . UTI (lower urinary tract infection) 12/20/2015  . COPD (chronic obstructive pulmonary disease) ([place]) 12/20/2015  . GERD (gastroesophageal reflux disease) 12/20/2015  . Type 2 diabetes mellitus with hyperlipidemia ([place]) 02/25/2014  . HTN (hypertension) 02/25/2014  . Cigarette smoker 02/25/2014  . Obesity 02/25/2014  . Hx of ischemic right MCA stroke, 1998 02/25/2014  . Cerebral infarction ([place]) 02/20/2014    Orientation RESPIRATION BLADDER Height & Weight     Self, Time, Situation, Place  Normal Continent, External catheter Weight: 100 lb 1.4 oz (45.4 kg) Height:  5\' 7"  (170.2 cm)  BEHAVIORAL SYMPTOMS/MOOD  NEUROLOGICAL BOWEL NUTRITION STATUS      Continent Diet  AMBULATORY STATUS COMMUNICATION OF NEEDS Skin   Extensive Assist Verbally PU Stage and Appropriate Care (Sacrum medial unstageable- full thickness tissue loss in which the base of the injury is covered by slough and/or eschar in the wound bed. Foam lift dressing to assess site every shift. Dressing twice a day)     PU Stage 3 Dressing: No Dressing                 Personal Care Assistance Level of Assistance  Bathing, Feeding, Dressing Bathing Assistance: Maximum assistance Feeding assistance: Limited assistance Dressing Assistance: Maximum assistance     Functional Limitations Info  Sight, Hearing, Speech Sight Info: Impaired Hearing Info: Adequate Speech Info: Adequate    SPECIAL CARE FACTORS FREQUENCY                       Contractures Contractures Info: Not present    Additional Factors Info  Code Status, Allergies Code Status Info: DNR Allergies Info: Morphine, Ace inhibitors, Flexeril, Paxil, Silver sulfadiazine           Current Medications (10/23/2020):  This is the current hospital active medication list Current Facility-Administered Medications  Medication Dose Route Frequency Provider Last Rate Last Admin  . acetaminophen (TYLENOL) tablet 650 mg  650 mg Oral Q6H PRN Adefeso, Oladapo, DO   650 mg at 10/23/20 0157  . ALPRAZolam Duanne Moron) tablet 0.5 mg  0.5 mg Oral BID Heath Lark D, DO   0.5 mg at 10/22/20 2203  . atorvastatin (LIPITOR) tablet 80 mg  80 mg Oral Daily  Bernadette Hoit, DO   80 mg at 10/22/20 1746  . ceFEPIme (MAXIPIME) 2 g in sodium chloride 0.9 % 100 mL IVPB  2 g Intravenous Q12H Shah, Pratik D, DO 200 mL/hr at 10/22/20 2202 2 g at 10/22/20 2202  . Chlorhexidine Gluconate Cloth 2 % PADS 6 each  6 each Topical Daily Adefeso, Oladapo, DO   6 each at 10/22/20 1031  . collagenase (SANTYL) ointment   Topical BID Virl Cagey, MD      . digoxin Fonnie Birkenhead) tablet 250 mcg  250 mcg Oral  Daily Heath Lark D, DO   250 mcg at 10/22/20 1028  . divalproex (DEPAKOTE) DR tablet 250 mg  250 mg Oral BID Heath Lark D, DO   250 mg at 10/22/20 2204  . influenza vac split quadrivalent PF (FLUARIX) injection 0.5 mL  0.5 mL Intramuscular Tomorrow-1000 Adefeso, Oladapo, DO      . insulin aspart (novoLOG) injection 0-5 Units  0-5 Units Subcutaneous QHS Adefeso, Oladapo, DO      . insulin aspart (novoLOG) injection 0-9 Units  0-9 Units Subcutaneous TID WC Adefeso, Oladapo, DO   3 Units at 10/23/20 0900  . metoprolol succinate (TOPROL-XL) 24 hr tablet 25 mg  25 mg Oral BID Manuella Ghazi, Pratik D, DO   25 mg at 10/22/20 1028  . pantoprazole (PROTONIX) EC tablet 40 mg  40 mg Oral Daily Adefeso, Oladapo, DO   40 mg at 10/22/20 1029  . QUEtiapine (SEROQUEL) tablet 25 mg  25 mg Oral BID Heath Lark D, DO   25 mg at 10/22/20 2203  . traZODone (DESYREL) tablet 50 mg  50 mg Oral Once Neila Gear, NP         Discharge Medications: Please see discharge summary for a list of discharge medications.  Relevant Imaging Results:  Relevant Lab Results:   Additional Information SSN: [id number]  Iona Beard, [place]

## 2020-10-23 NOTE — Progress Notes (Signed)
Report called and given to Jacquenette Shone, [facility] and Rehab facility. All questions were answered and no further questions at this time. Patient in stable condition and in no acute distress at time of discharge. Patient will be transported via [facility].

## 2020-10-23 NOTE — Progress Notes (Signed)
[facility]  Wound looked at today. Left lateral edge and left side with fibrinous tissue, otherwise granulation and down to the bone centrally. Minimal fat necrosis on the left lateral edge.       Wound care had consulted Via photo but not in person.  Dr. Lysle Pearl saw over the weekend and held plavix for possible debridement.  Given the superficial nature of the fibrinous tissue on the left side of wound and down to bone centrally, I think santyl BID and dressing change should take care of the remaining tissue. She is sensitive in the area but allowed [place] to examine her.   I spoke with Dr. Manuella Ghazi and unsure that operative debridement truly necessary will good dressing changes. Discussed that pain is one of the main complaints and this is not going to resolve and this wound is not likely to heal given bilateral AKA, limited mobility etc.  Dr. Manuella Ghazi will discuss further with daughter and we will determine best course.   Curlene Labrum, MD [facility] [address] [place], [place] [address] [phone] (office)

## 2020-10-23 NOTE — TOC Transition Note (Signed)
Transition of Care [facility]) - CM/SW Discharge Note   Patient Details  Name: [name] MRN: [id number] Date of Birth: Oct 14, 1966  Transition of Care [facility]) CM/SW Contact:  Iona Beard, [place] Phone Number: 10/23/2020, 2:23 PM   Clinical Narrative:    CSW spoke with Jackelyn Poling at [facility] to confirm that pt can return today. Per Jackelyn Poling pt can return and any class 2 medications will need hard scripts. CSW updated Attending Dr. Manuella Ghazi and RN of okay for pt to return to [facility].  CSW updated pts daughter Donella Stade Grubbs) of pts transfer back to [facility]. EMS scheduled and medical necessity form was printed to the floor. CSW sent Fl2, d/c summary and orders in the [place]. TOC signing off.    Final next level of care: Long Term Nursing Home Barriers to Discharge: Barriers Resolved   Patient Goals and CMS Choice Patient states their goals for this hospitalization and ongoing recovery are:: Return to [facility]   Choice offered to / list presented to : NA  Discharge Placement                Patient to be transferred to facility by: [place] Name of family member notified: Claiborne Billings [phone] Patient and family notified of of transfer: 10/23/20  Discharge Plan and Services                DME Arranged: N/A DME Agency: NA       HH Arranged: NA HH Agency: NA        Social Determinants of Health (SDOH) Interventions     Readmission Risk Interventions No flowsheet data found.

## 2020-10-23 NOTE — Care Management Important Message (Signed)
Important Message  Patient Details  Name: [name] MRN: [id number] Date of Birth: Dec 26, 1966   Medicare Important Message Given:  Yes (RN, Lattie Haw was asked to deliver letter)     Tommy Medal 10/23/2020, 1:30 PM

## 2020-10-23 NOTE — ACP (Advance Care Planning) (Signed)
Assisted Ms [name] and her daughter, [name] complete her HCPOA and placed a copy in her chart. I gave copies and original to Ms [place] and [place].

## 2020-10-23 NOTE — NC FL2 (Signed)
MEDICAID FL2 LEVEL OF CARE SCREENING TOOL     IDENTIFICATION  Patient Name: [name] Birthdate: [birth year] Sex: female Admission Date (Current Location): 10/20/2020  [facility] and [place] Number:  [employer] and Address:  [place] [address], [place]      Provider Number: [id number]  Attending Physician Name and Address:  Rodena Goldmann, DO  Relative Name and Phone Number:  Barnett Abu [phone]    Current Level of Care: Hospital Recommended Level of Care: [place] Prior Approval Number:    Date Approved/Denied:   PASRR Number:    Discharge Plan: Other (Comment) Fortunato Curling for LTC)    Current Diagnoses: Patient Active Problem List   Diagnosis Date Noted  . Hypotension 10/20/2020  . Sacral decubitus ulcer 10/20/2020  . Lactic acidosis 10/20/2020  . Hypoglycemia 10/20/2020  . Hypoalbuminemia 10/20/2020  . Elevated alpha fetoprotein 10/20/2020  . Pressure injury of skin 07/06/2020  . Encephalopathy acute 07/05/2020  . History of acute anterior wall MI 10/24/2016  . 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 Chronic systolic congestive heart failure ([place]) 10/24/2016  . Cardiomyopathy, ischemic 10/24/2016  . Sinus tachycardia 10/24/2016  . Sepsis ([place]) 12/20/2015  . UTI (lower urinary tract infection) 12/20/2015  . COPD (chronic obstructive pulmonary disease) ([place]) 12/20/2015  . GERD (gastroesophageal reflux disease) 12/20/2015  . Type 2 diabetes mellitus with hyperlipidemia ([place]) 02/25/2014  . HTN (hypertension) 02/25/2014  . Cigarette smoker 02/25/2014  . Obesity 02/25/2014  . Hx of ischemic right MCA stroke, 1998 02/25/2014  . Cerebral infarction ([place]) 02/20/2014    Orientation RESPIRATION BLADDER Height & Weight     Self, Time, Situation, Place  Normal Continent, External catheter Weight: 100 lb 1.4 oz (45.4 kg) Height:  5\' 7"  (170.2 cm)  BEHAVIORAL SYMPTOMS/MOOD  NEUROLOGICAL BOWEL NUTRITION STATUS      Continent Diet  AMBULATORY STATUS COMMUNICATION OF NEEDS Skin   Extensive Assist Verbally PU Stage and Appropriate Care (Sacrum medial unstageable- full thickness tissue loss in which the base of the injury is covered by slough and/or eschar in the wound bed. Foam lift dressing to assess site every shift. Dressing twice a day)     PU Stage 3 Dressing: No Dressing                 Personal Care Assistance Level of Assistance  Bathing, Feeding, Dressing Bathing Assistance: Maximum assistance Feeding assistance: Limited assistance Dressing Assistance: Maximum assistance     Functional Limitations Info  Sight, Hearing, Speech Sight Info: Impaired Hearing Info: Adequate Speech Info: Adequate    SPECIAL CARE FACTORS FREQUENCY                       Contractures Contractures Info: Not present    Additional Factors Info  Code Status, Allergies Code Status Info: FULL Allergies Info: Morphine, Ace inhibitors, Flexeril, Paxil, Silver sulfadiazine           Current Medications (10/23/2020):  This is the current hospital active medication list Current Facility-Administered Medications  Medication Dose Route Frequency Provider Last Rate Last Admin  . acetaminophen (TYLENOL) tablet 650 mg  650 mg Oral Q6H PRN Adefeso, Oladapo, DO   650 mg at 10/23/20 0157  . ALPRAZolam Duanne Moron) tablet 0.5 mg  0.5 mg Oral BID Heath Lark D, DO   0.5 mg at 10/22/20 2203  . atorvastatin (LIPITOR) tablet 80 mg  80 mg Oral Daily  Bernadette Hoit, DO   80 mg at 10/22/20 1746  . ceFEPIme (MAXIPIME) 2 g in sodium chloride 0.9 % 100 mL IVPB  2 g Intravenous Q12H Shah, Pratik D, DO 200 mL/hr at 10/22/20 2202 2 g at 10/22/20 2202  . Chlorhexidine Gluconate Cloth 2 % PADS 6 each  6 each Topical Daily Adefeso, Oladapo, DO   6 each at 10/22/20 1031  . collagenase (SANTYL) ointment   Topical BID Virl Cagey, MD      . digoxin Fonnie Birkenhead) tablet 250 mcg  250 mcg  Oral Daily Heath Lark D, DO   250 mcg at 10/22/20 1028  . divalproex (DEPAKOTE) DR tablet 250 mg  250 mg Oral BID Heath Lark D, DO   250 mg at 10/22/20 2204  . influenza vac split quadrivalent PF (FLUARIX) injection 0.5 mL  0.5 mL Intramuscular Tomorrow-1000 Adefeso, Oladapo, DO      . insulin aspart (novoLOG) injection 0-5 Units  0-5 Units Subcutaneous QHS Adefeso, Oladapo, DO      . insulin aspart (novoLOG) injection 0-9 Units  0-9 Units Subcutaneous TID WC Adefeso, Oladapo, DO   3 Units at 10/23/20 0900  . metoprolol succinate (TOPROL-XL) 24 hr tablet 25 mg  25 mg Oral BID Manuella Ghazi, Pratik D, DO   25 mg at 10/22/20 1028  . pantoprazole (PROTONIX) EC tablet 40 mg  40 mg Oral Daily Adefeso, Oladapo, DO   40 mg at 10/22/20 1029  . QUEtiapine (SEROQUEL) tablet 25 mg  25 mg Oral BID Heath Lark D, DO   25 mg at 10/22/20 2203  . traZODone (DESYREL) tablet 50 mg  50 mg Oral Once Neila Gear, NP         Discharge Medications: Please see discharge summary for a list of discharge medications.  Relevant Imaging Results:  Relevant Lab Results:   Additional Information SSN: [id number]  Iona Beard, [place]

## 2020-10-23 NOTE — Progress Notes (Signed)
PHARMACY CONSULT NOTE FOR:  OUTPATIENT  PARENTERAL ANTIBIOTIC THERAPY (OPAT)  Indication: Sacral wound infection Regimen: Cefepime 2gm IV q12h End date: 11/02/11/21  IV antibiotic discharge orders are pended. To discharging provider:  please sign these orders via discharge navigator,  Select New Orders & click on the button choice - Manage This Unsigned Work.     Thank you for allowing pharmacy to be a part of this patient's care.  Isac Sarna, BS [place], BCPS Clinical Pharmacist Pager [phone] 10/23/2020, 11:33 AM

## 2020-10-23 NOTE — TOC Initial Note (Signed)
Transition of Care [facility] - Initial/Assessment Note    Patient Details  Name: [name] MRN: [id number] Date of Birth: Apr 30, 1966  Transition of Care [facility]) CM/SW Contact:    Iona Beard, [place] Phone Number: 10/23/2020, 11:14 AM  Clinical Narrative:                 Pt admitted for hypotension. CSW spoke with Jackelyn Poling at [facility] to confirm pt can return at D/C. Debbie confirmed and stated that a new FL2 would need to be started. CSW completed FL2. TOC to follow.         Patient Goals and CMS Choice        Expected Discharge Plan and Services                                                Prior Living Arrangements/Services                       Activities of Daily Living Home Assistive Devices/Equipment: Wheelchair ADL Screening (condition at time of admission) Patient's cognitive ability adequate to safely complete daily activities?: Yes Is the patient deaf or have difficulty hearing?: No Does the patient have difficulty seeing, even when wearing glasses/contacts?: No Does the patient have difficulty concentrating, remembering, or making decisions?: No Patient able to express need for assistance with ADLs?: Yes Does the patient have difficulty dressing or bathing?: No Independently performs ADLs?: No Communication: Independent Is this a change from baseline?: Pre-admission baseline Dressing (OT): Dependent Is this a change from baseline?: Pre-admission baseline Grooming: Dependent Is this a change from baseline?: Pre-admission baseline Feeding: Needs assistance Is this a change from baseline?: Pre-admission baseline Bathing: Needs assistance Is this a change from baseline?: Pre-admission baseline Toileting: Needs assistance Is this a change from baseline?: Pre-admission baseline In/Out Bed: Needs assistance Is this a change from baseline?: Pre-admission baseline Walks in Home: Needs assistance Is this a change from baseline?:  Pre-admission baseline Does the patient have difficulty walking or climbing stairs?: Yes Weakness of Legs: Both (above knee bilateral amputations) Weakness of Arms/Hands: Both  Permission Sought/Granted                  Emotional Assessment              Admission diagnosis:  Hypotension [I95.9] Pressure injury of sacral region, 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 Hypotension 10/20/2020  . Sacral decubitus ulcer 10/20/2020  . Lactic acidosis 10/20/2020  . Hypoglycemia 10/20/2020  . Hypoalbuminemia 10/20/2020  . Elevated alpha fetoprotein 10/20/2020  . Pressure injury of skin 07/06/2020  . Encephalopathy acute 07/05/2020  . History of acute anterior wall MI 10/24/2016  . 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 Chronic systolic congestive heart failure ([place]) 10/24/2016  . Cardiomyopathy, ischemic 10/24/2016  . Sinus tachycardia 10/24/2016  . Sepsis ([place]) 12/20/2015  . UTI (lower urinary tract infection) 12/20/2015  . COPD (chronic obstructive pulmonary disease) ([place]) 12/20/2015  . GERD (gastroesophageal reflux disease) 12/20/2015  . Type 2 diabetes mellitus with hyperlipidemia ([place]) 02/25/2014  . HTN (hypertension) 02/25/2014  . Cigarette smoker 02/25/2014  . Obesity 02/25/2014  . Hx of ischemic right MCA stroke, 1998 02/25/2014  .  Cerebral infarction ([place]) 02/20/2014   PCP:  Caprice Renshaw, MD Pharmacy:   [place], [place] [address] Phone: [phone] Fax: [place] [address], [name] [address] Phone: [phone] Fax: [phone]     Social Determinants of Health (SDOH) Interventions    Readmission Risk Interventions No flowsheet data found.

## 2020-10-24 LAB — AEROBIC CULTURE W GRAM STAIN (SUPERFICIAL SPECIMEN): Special Requests: NORMAL

## 2020-10-25 LAB — CULTURE, BLOOD (ROUTINE X 2)
Culture: NO GROWTH
Culture: NO GROWTH
Special Requests: ADEQUATE
Special Requests: ADEQUATE

## 2020-12-30 DEATH — deceased

## 2021-03-10 IMAGING — DX DG CHEST 1V PORT
1 series · 1 of 1 positions shown · non-contrast
Comparison: 06/01/2020

CLINICAL DATA: Back pain.  Fall from bed

EXAM:
PORTABLE CHEST 1 VIEW

[chest ap]
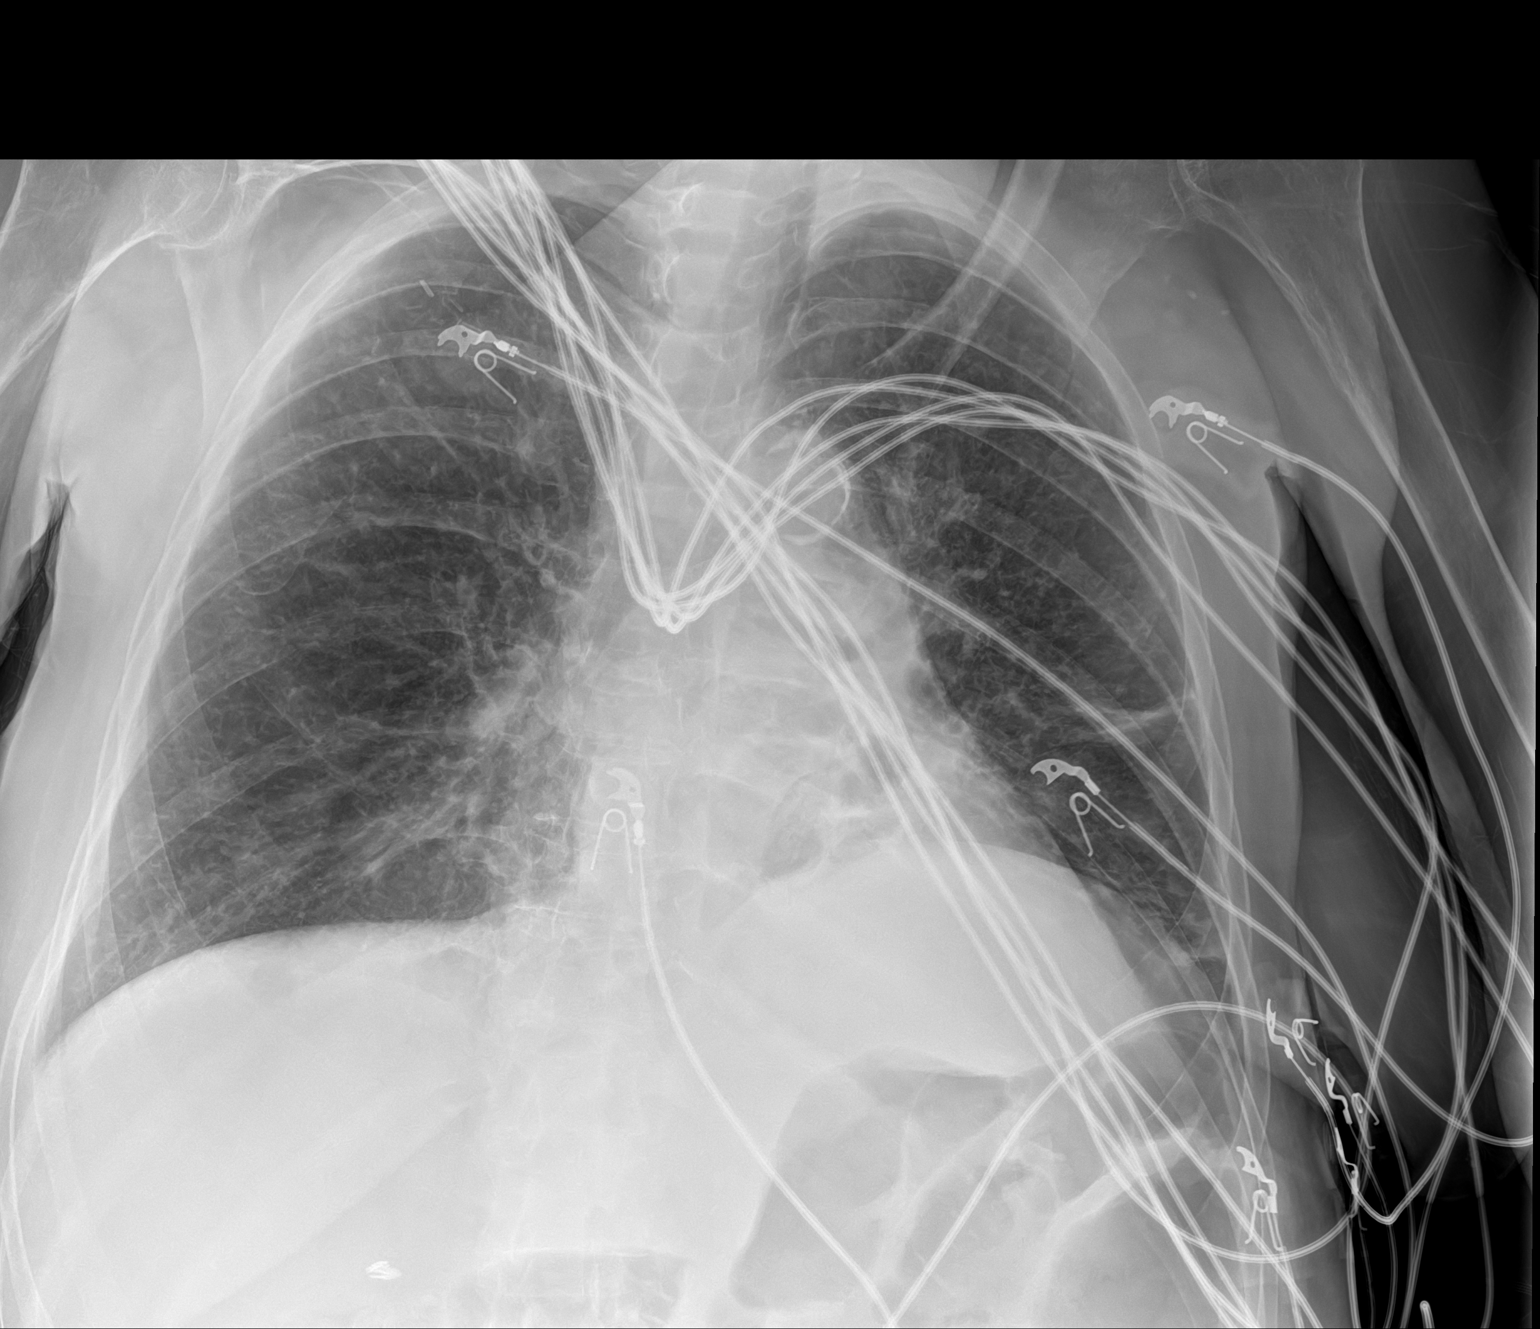

[1 of 1 positions shown; findings below may reference images not displayed]

FINDINGS: The heart size and mediastinal contours are within normal limits.
Age advanced aortic atherosclerosis. Linear opacities within the
periphery of the left mid lung and left lung base favor atelectasis.
Chronic coarsened interstitial markings bilaterally. No pleural
effusion or pneumothorax identified. The visualized skeletal
structures are unremarkable.
IMPRESSION: 1. Linear opacities within the periphery of the left mid lung and
left lung base, favor atelectasis. Otherwise no acute
cardiopulmonary findings.
2. Age advanced aortic atherosclerosis.
